# Patient Record
Sex: Female | Born: 1945 | Race: White | Hispanic: No | Marital: Married | Smoking: Never smoker
Health system: Southern US, Community
[De-identification: ages and names within clinical notes are randomized; demographics above are authoritative.]

## PROBLEM LIST (undated history)

## (undated) DIAGNOSIS — M810 Age-related osteoporosis without current pathological fracture: Secondary | ICD-10-CM

## (undated) DIAGNOSIS — T7840XA Allergy, unspecified, initial encounter: Secondary | ICD-10-CM

## (undated) DIAGNOSIS — H269 Unspecified cataract: Secondary | ICD-10-CM

## (undated) DIAGNOSIS — Z9289 Personal history of other medical treatment: Secondary | ICD-10-CM

## (undated) DIAGNOSIS — I4819 Other persistent atrial fibrillation: Secondary | ICD-10-CM

## (undated) DIAGNOSIS — I499 Cardiac arrhythmia, unspecified: Secondary | ICD-10-CM

## (undated) DIAGNOSIS — N819 Female genital prolapse, unspecified: Secondary | ICD-10-CM

## (undated) DIAGNOSIS — M1990 Unspecified osteoarthritis, unspecified site: Secondary | ICD-10-CM

## (undated) DIAGNOSIS — E785 Hyperlipidemia, unspecified: Secondary | ICD-10-CM

## (undated) DIAGNOSIS — J189 Pneumonia, unspecified organism: Secondary | ICD-10-CM

## (undated) DIAGNOSIS — J0100 Acute maxillary sinusitis, unspecified: Secondary | ICD-10-CM

## (undated) DIAGNOSIS — I10 Essential (primary) hypertension: Secondary | ICD-10-CM

## (undated) HISTORY — PX: TONSILLECTOMY: SUR1361

## (undated) HISTORY — DX: Unspecified osteoarthritis, unspecified site: M19.90

## (undated) HISTORY — PX: ABDOMINAL HYSTERECTOMY: SHX81

## (undated) HISTORY — DX: Acute maxillary sinusitis, unspecified: J01.00

## (undated) HISTORY — PX: KNEE SURGERY: SHX244

## (undated) HISTORY — DX: Personal history of other medical treatment: Z92.89

## (undated) HISTORY — DX: Other persistent atrial fibrillation: I48.19

## (undated) HISTORY — DX: Female genital prolapse, unspecified: N81.9

## (undated) HISTORY — PX: DILATION AND CURETTAGE OF UTERUS: SHX78

## (undated) HISTORY — DX: Allergy, unspecified, initial encounter: T78.40XA

## (undated) HISTORY — DX: Unspecified cataract: H26.9

## (undated) HISTORY — DX: Age-related osteoporosis without current pathological fracture: M81.0

## (undated) HISTORY — PX: DENTAL SURGERY: SHX609

## (undated) HISTORY — DX: Essential (primary) hypertension: I10

## (undated) HISTORY — DX: Hyperlipidemia, unspecified: E78.5

## (undated) HISTORY — DX: Cardiac arrhythmia, unspecified: I49.9

## (undated) HISTORY — PX: CHOLECYSTECTOMY: SHX55

---

## 2013-01-31 ENCOUNTER — Ambulatory Visit: Payer: Self-pay | Admitting: Gastroenterology

## 2013-04-21 LAB — HM PAP SMEAR: HM PAP: NEGATIVE

## 2013-05-24 DIAGNOSIS — IMO0002 Reserved for concepts with insufficient information to code with codable children: Secondary | ICD-10-CM | POA: Diagnosis not present

## 2013-05-24 DIAGNOSIS — M1710 Unilateral primary osteoarthritis, unspecified knee: Secondary | ICD-10-CM | POA: Diagnosis not present

## 2013-05-31 DIAGNOSIS — M1710 Unilateral primary osteoarthritis, unspecified knee: Secondary | ICD-10-CM | POA: Diagnosis not present

## 2013-05-31 DIAGNOSIS — IMO0002 Reserved for concepts with insufficient information to code with codable children: Secondary | ICD-10-CM | POA: Diagnosis not present

## 2013-06-03 DIAGNOSIS — J019 Acute sinusitis, unspecified: Secondary | ICD-10-CM | POA: Diagnosis not present

## 2013-07-25 DIAGNOSIS — J301 Allergic rhinitis due to pollen: Secondary | ICD-10-CM | POA: Diagnosis not present

## 2013-07-25 DIAGNOSIS — H93299 Other abnormal auditory perceptions, unspecified ear: Secondary | ICD-10-CM | POA: Diagnosis not present

## 2013-07-25 DIAGNOSIS — H6120 Impacted cerumen, unspecified ear: Secondary | ICD-10-CM | POA: Diagnosis not present

## 2014-02-21 DIAGNOSIS — Z23 Encounter for immunization: Secondary | ICD-10-CM | POA: Diagnosis not present

## 2014-05-16 DIAGNOSIS — J209 Acute bronchitis, unspecified: Secondary | ICD-10-CM | POA: Diagnosis not present

## 2014-05-16 DIAGNOSIS — J019 Acute sinusitis, unspecified: Secondary | ICD-10-CM | POA: Diagnosis not present

## 2014-05-22 DIAGNOSIS — E785 Hyperlipidemia, unspecified: Secondary | ICD-10-CM | POA: Diagnosis not present

## 2014-05-22 DIAGNOSIS — I10 Essential (primary) hypertension: Secondary | ICD-10-CM | POA: Diagnosis not present

## 2014-05-22 DIAGNOSIS — J329 Chronic sinusitis, unspecified: Secondary | ICD-10-CM | POA: Diagnosis not present

## 2014-07-26 DIAGNOSIS — E669 Obesity, unspecified: Secondary | ICD-10-CM | POA: Diagnosis not present

## 2014-07-26 DIAGNOSIS — Z01419 Encounter for gynecological examination (general) (routine) without abnormal findings: Secondary | ICD-10-CM | POA: Diagnosis not present

## 2014-10-05 ENCOUNTER — Other Ambulatory Visit: Payer: Self-pay | Admitting: Obstetrics and Gynecology

## 2014-10-05 DIAGNOSIS — Z1231 Encounter for screening mammogram for malignant neoplasm of breast: Secondary | ICD-10-CM

## 2014-10-05 DIAGNOSIS — M810 Age-related osteoporosis without current pathological fracture: Secondary | ICD-10-CM

## 2014-10-12 ENCOUNTER — Ambulatory Visit
Admission: RE | Admit: 2014-10-12 | Discharge: 2014-10-12 | Disposition: A | Discharge status: Home / Self Care | Payer: Medicare Other | Source: Ambulatory Visit | Attending: Obstetrics and Gynecology | Admitting: Obstetrics and Gynecology

## 2014-10-12 ENCOUNTER — Other Ambulatory Visit: Payer: Self-pay | Admitting: Obstetrics and Gynecology

## 2014-10-12 DIAGNOSIS — Z1231 Encounter for screening mammogram for malignant neoplasm of breast: Secondary | ICD-10-CM | POA: Diagnosis not present

## 2014-10-12 DIAGNOSIS — M810 Age-related osteoporosis without current pathological fracture: Secondary | ICD-10-CM | POA: Insufficient documentation

## 2014-10-24 ENCOUNTER — Telehealth: Payer: Self-pay | Admitting: Obstetrics and Gynecology

## 2014-10-24 NOTE — Telephone Encounter (Signed)
PLS ADVISE

## 2014-10-25 ENCOUNTER — Other Ambulatory Visit: Payer: Self-pay | Admitting: Obstetrics and Gynecology

## 2014-10-25 MED ORDER — ESTROGENS CONJ SYNTHETIC A 0.3 MG PO TABS: 0.3000 mg | ORAL_TABLET | Freq: Every day | ORAL | Status: DC

## 2014-10-25 NOTE — Telephone Encounter (Signed)
Returned call, answered questions about biphophinates - pt has dental implants and advised no to take them in the future, desires restart of cenestin to help with bone loss. rx sent in

## 2014-11-21 ENCOUNTER — Telehealth: Payer: Self-pay | Admitting: Obstetrics and Gynecology

## 2014-11-21 NOTE — Telephone Encounter (Signed)
Wonderful, will let pt know

## 2014-11-21 NOTE — Telephone Encounter (Signed)
Rep from insurance called and stated that the primerin was approved

## 2014-12-06 ENCOUNTER — Ambulatory Visit (INDEPENDENT_AMBULATORY_CARE_PROVIDER_SITE_OTHER): Payer: Medicare Other | Admitting: Family Medicine

## 2014-12-06 ENCOUNTER — Encounter: Payer: Self-pay | Admitting: Family Medicine

## 2014-12-06 VITALS — BP 118/70 | HR 89 | Temp 98.5°F | Resp 17 | Ht 60.0 in | Wt 154.6 lb

## 2014-12-06 DIAGNOSIS — I10 Essential (primary) hypertension: Secondary | ICD-10-CM

## 2014-12-06 DIAGNOSIS — E785 Hyperlipidemia, unspecified: Secondary | ICD-10-CM

## 2014-12-06 NOTE — Progress Notes (Signed)
Name: Zoe SievertJudy J Alvarado   MRN: [id number]    DOB: [birth year]   Date:12/06/2014       Progress Note  Subjective  Chief Complaint  Chief Complaint  Patient presents with  . Establish Care    NP (Dr. Elsie StainMoffeit)  . Hypertension  . Hyperlipidemia  . Advice Only    Questions concerning heart screening at a health fair she attended in October 27, 2014    Hypertension This is a chronic problem. The problem is controlled. Pertinent negatives include no chest pain, headaches, palpitations or shortness of breath. Past treatments include angiotensin blockers. There is no history of angina, kidney disease, CAD/MI or CVA.  Hyperlipidemia This is a chronic problem. The problem is controlled. Recent lipid tests were reviewed and are normal. Pertinent negatives include no chest pain, leg pain, myalgias or shortness of breath. She is currently on no antihyperlipidemic treatment.     Past Medical History  Diagnosis Date  . Hyperlipidemia   . Hypertension   . Allergy     History reviewed. No pertinent past surgical history.  Family History  Problem Relation Age of Onset  . Breast cancer Maternal Aunt 40  . Breast cancer Maternal Grandmother     History   Social History  . Marital Status: Married    Spouse Name: N/A  . Number of Children: N/A  . Years of Education: N/A   Occupational History  . Not on file.   Social History Main Topics  . Smoking status: Never Smoker   . Smokeless tobacco: Never Used  . Alcohol Use: No  . Drug Use: No  . Sexual Activity: Not on file   Other Topics Concern  . Not on file   Social History Narrative     Current outpatient prescriptions:  .  Calcium Carbonate-Vit D-Min (CALTRATE 600+D PLUS MINERALS) 600-800 MG-UNIT CHEW, Chew by mouth., Disp: , Rfl:  .  Cholecalciferol (VITAMIN D) 2000 UNITS CAPS, Take 1 capsule by mouth daily., Disp: , Rfl:  .  Estradiol-Estriol-Progesterone (BIEST/PROGESTERONE TD), , Disp: , Rfl:  .  Estradiol-Estriol-Progesterone  (BIEST/PROGESTERONE) CREA, , Disp: , Rfl:  .  estrogens conjugated, synthetic A, (CENESTIN) 0.3 MG tablet, Take 1 tablet (0.3 mg total) by mouth daily., Disp: 30 tablet, Rfl: 6 .  MULTIPLE VITAMINS-MINERALS PO, Take 1 tablet by mouth daily., Disp: , Rfl:  .  PREMARIN 0.3 MG tablet, Take 0.3 mg by mouth daily., Disp: , Rfl: 6 .  valsartan (DIOVAN) 160 MG tablet, Take 160 mg by mouth daily., Disp: , Rfl: 1  Allergies  Allergen Reactions  . Codeine   . Penicillins      Review of Systems  Respiratory: Negative for shortness of breath.   Cardiovascular: Negative for chest pain and palpitations.  Musculoskeletal: Positive for joint pain. Negative for myalgias.  Neurological: Negative for headaches.     Objective  Filed Vitals:   12/06/14 1553  BP: 118/70  Pulse: 89  Temp: 98.5 F (36.9 C)  TempSrc: Oral  Resp: 17  Height: 5' (1.524 m)  Weight: 154 lb 9.6 oz (70.126 kg)  SpO2: 96%    Physical Exam  Constitutional: She is oriented to person, place, and time and well-developed, well-nourished, and in no distress.  HENT:  Head: Normocephalic and atraumatic.  Eyes: Pupils are equal, round, and reactive to light.  Cardiovascular: Normal rate and regular rhythm.   Pulmonary/Chest: Effort normal and breath sounds normal.  Abdominal: Soft. Bowel sounds are normal.  Neurological: She is alert  and oriented to person, place, and time.  Nursing note and vitals reviewed.   Assessment & Plan 1. Essential hypertension BP is stable and controlled on present therapy.  2. Hyperlipidemia Pt. Had recent lab work done by an outside company which is normal. Recheck in 6 months.  Djeneba Barsch Asad A[facility] 12/06/2014 4:27 PM

## 2015-03-13 ENCOUNTER — Encounter: Payer: Self-pay | Admitting: Family Medicine

## 2015-03-13 ENCOUNTER — Ambulatory Visit (INDEPENDENT_AMBULATORY_CARE_PROVIDER_SITE_OTHER): Payer: Medicare Other | Admitting: Family Medicine

## 2015-03-13 VITALS — BP 114/68 | HR 87 | Temp 98.3°F | Resp 18 | Ht 59.0 in | Wt 156.6 lb

## 2015-03-13 DIAGNOSIS — J0110 Acute frontal sinusitis, unspecified: Secondary | ICD-10-CM | POA: Diagnosis not present

## 2015-03-13 MED ORDER — AZITHROMYCIN 250 MG PO TABS: ORAL_TABLET | ORAL | Status: DC

## 2015-03-13 NOTE — Progress Notes (Signed)
Name: Zoe Alvarado   MRN: [id number]    DOB: Nov 29, 1945   Date:03/13/2015       Progress Note  Subjective  Chief Complaint  Chief Complaint  Patient presents with  . URI    Onset 03/10/15, runny nose, sinus pressure/pain, ear popping, headache and achy feeling. Getting worst patient has been taking Claritin and just switched to Allegra, helping a little better.    URI  This is a new problem. The current episode started in the past 7 days (3 days ago). There has been no fever. Associated symptoms include congestion, coughing, ear pain, rhinorrhea, sinus pain and a sore throat. She has tried antihistamine for the symptoms.    Past Medical History  Diagnosis Date  . Hyperlipidemia   . Hypertension   . Allergy     No past surgical history on file.  Family History  Problem Relation Age of Onset  . Breast cancer Maternal Aunt 40  . Breast cancer Maternal Grandmother     Social History   Social History  . Marital Status: Married    Spouse Name: N/A  . Number of Children: N/A  . Years of Education: N/A   Occupational History  . Not on file.   Social History Main Topics  . Smoking status: Never Smoker   . Smokeless tobacco: Never Used  . Alcohol Use: No  . Drug Use: No  . Sexual Activity:    Partners: Male   Other Topics Concern  . Not on file   Social History Narrative    Current outpatient prescriptions:  .  Calcium Carbonate-Vit D-Min (CALTRATE 600+D PLUS MINERALS) 600-800 MG-UNIT CHEW, Chew by mouth., Disp: , Rfl:  .  Cholecalciferol (VITAMIN D) 2000 UNITS CAPS, Take 1 capsule by mouth daily., Disp: , Rfl:  .  Estradiol-Estriol-Progesterone (BIEST/PROGESTERONE TD), , Disp: , Rfl:  .  Estradiol-Estriol-Progesterone (BIEST/PROGESTERONE) CREA, , Disp: , Rfl:  .  estrogens conjugated, synthetic A, (CENESTIN) 0.3 MG tablet, Take 1 tablet (0.3 mg total) by mouth daily., Disp: 30 tablet, Rfl: 6 .  MULTIPLE VITAMINS-MINERALS PO, Take 1 tablet by mouth daily., Disp: ,  Rfl:  .  PREMARIN 0.3 MG tablet, Take 0.3 mg by mouth daily., Disp: , Rfl: 6 .  valsartan (DIOVAN) 160 MG tablet, Take 160 mg by mouth daily., Disp: , Rfl: 1  Allergies  Allergen Reactions  . Codeine   . Penicillins    Review of Systems  Constitutional: Negative for fever and chills.  HENT: Positive for congestion, ear pain, rhinorrhea and sore throat.   Eyes: Negative for pain.  Respiratory: Positive for cough.     Objective  Filed Vitals:   03/13/15 1054  BP: 114/68  Pulse: 87  Temp: 98.3 F (36.8 C)  TempSrc: Oral  Resp: 18  Height: 4\' 11"  (1.499 m)  Weight: 156 lb 9.6 oz (71.033 kg)  SpO2: 96%    Physical Exam  Constitutional: She is well-developed, well-nourished, and in no distress.  HENT:  Head: Normocephalic and atraumatic.  Right Ear: Ear canal normal.  Left Ear: Ear canal normal.  Nose: Right sinus exhibits maxillary sinus tenderness and frontal sinus tenderness. Left sinus exhibits maxillary sinus tenderness and frontal sinus tenderness.  Mouth/Throat: No posterior oropharyngeal erythema.  Cerumen impaction.  Cardiovascular: Normal rate, regular rhythm and normal heart sounds.   No murmur heard. Pulmonary/Chest: Effort normal and breath sounds normal. She has no wheezes.  Nursing note and vitals reviewed.   Assessment & Plan  1.  Acute non-recurrent frontal sinusitis Symptoms improving on antihistamine therapy. Encouraged conservative management and provided prescription for Z-Pak in case her symptoms do not resolve within the next 48 hours. - azithromycin (ZITHROMAX Z-PAK) 250 MG tablet; 2 tabs po x day 1, then 1 tab po q day x 4 days  Dispense: 6 each; Refill: 0   Lyndell Allaire Asad [facility] 03/13/2015 11:09 AM

## 2015-04-17 DIAGNOSIS — Z23 Encounter for immunization: Secondary | ICD-10-CM | POA: Diagnosis not present

## 2015-04-26 ENCOUNTER — Encounter: Payer: Self-pay | Admitting: Family Medicine

## 2015-05-02 ENCOUNTER — Encounter: Payer: Self-pay | Admitting: Family Medicine

## 2015-05-02 ENCOUNTER — Ambulatory Visit (INDEPENDENT_AMBULATORY_CARE_PROVIDER_SITE_OTHER): Payer: Medicare Other | Admitting: Family Medicine

## 2015-05-02 VITALS — BP 114/70 | HR 68 | Temp 98.2°F | Resp 15 | Ht 59.0 in | Wt 154.8 lb

## 2015-05-02 DIAGNOSIS — E66811 Obesity, class 1: Secondary | ICD-10-CM

## 2015-05-02 DIAGNOSIS — I10 Essential (primary) hypertension: Secondary | ICD-10-CM

## 2015-05-02 DIAGNOSIS — E669 Obesity, unspecified: Secondary | ICD-10-CM | POA: Diagnosis not present

## 2015-05-02 NOTE — Progress Notes (Signed)
Name: Zoe Alvarado   MRN: [id number]    DOB: 09-Apr-1946   Date:05/02/2015       Progress Note  Subjective  Chief Complaint  Chief Complaint  Patient presents with  . Follow-up    Discuss Weight  . Hyperlipidemia  . Hypertension    Hypertension This is a chronic problem. The problem is unchanged. The problem is controlled. Pertinent negatives include no blurred vision, chest pain, headaches, palpitations or shortness of breath. Past treatments include angiotensin blockers. There is no history of kidney disease, CAD/MI or CVA.    Pt. Is here for weight management counseling and statement for her weight loss program. She weighs 154 lbs, BMI of 31.27. She has joined [employer] which stands for Take Off Pounds Sensibly. She is requesting a physician statement with recommendation on her goal weight.    Past Medical History  Diagnosis Date  . Hyperlipidemia   . Hypertension   . Allergy     History reviewed. No pertinent past surgical history.  Family History  Problem Relation Age of Onset  . Breast cancer Maternal Aunt 40  . Breast cancer Maternal Grandmother     Social History   Social History  . Marital Status: Married    Spouse Name: N/A  . Number of Children: N/A  . Years of Education: N/A   Occupational History  . Not on file.   Social History Main Topics  . Smoking status: Never Smoker   . Smokeless tobacco: Never Used  . Alcohol Use: No  . Drug Use: No  . Sexual Activity:    Partners: Male   Other Topics Concern  . Not on file   Social History Narrative     Current outpatient prescriptions:  .  Calcium Carbonate-Vit D-Min (CALTRATE 600+D PLUS MINERALS) 600-800 MG-UNIT CHEW, Chew by mouth., Disp: , Rfl:  .  Cholecalciferol (VITAMIN D) 2000 UNITS CAPS, Take 1 capsule by mouth daily., Disp: , Rfl:  .  Estradiol-Estriol-Progesterone (BIEST/PROGESTERONE TD), , Disp: , Rfl:  .  Estradiol-Estriol-Progesterone (BIEST/PROGESTERONE) CREA, , Disp: , Rfl:  .   estrogens conjugated, synthetic A, (CENESTIN) 0.3 MG tablet, Take 1 tablet (0.3 mg total) by mouth daily., Disp: 30 tablet, Rfl: 6 .  MULTIPLE VITAMINS-MINERALS PO, Take 1 tablet by mouth daily., Disp: , Rfl:  .  PREMARIN 0.3 MG tablet, Take 0.3 mg by mouth daily., Disp: , Rfl: 6 .  valsartan (DIOVAN) 160 MG tablet, Take 160 mg by mouth daily., Disp: , Rfl: 1  Allergies  Allergen Reactions  . Codeine   . Penicillins     Review of Systems  Constitutional: Negative for fever and chills.  Eyes: Negative for blurred vision and double vision.  Respiratory: Negative for shortness of breath.   Cardiovascular: Negative for chest pain and palpitations.  Neurological: Negative for headaches.    Objective  Filed Vitals:   05/02/15 1149  BP: 114/70  Pulse: 68  Temp: 98.2 F (36.8 C)  TempSrc: Oral  Resp: 15  Height:  (1.499 m)  Weight: 154 lb 12.8 oz (70.217 kg)  SpO2: 98%    Physical Exam  Constitutional: She is oriented to person, place, and time and well-developed, well-nourished, and in no distress.  HENT:  Head: Normocephalic and atraumatic.  Cardiovascular: Normal rate, regular rhythm and normal heart sounds.   No murmur heard. Pulmonary/Chest: Effort normal and breath sounds normal. She has no wheezes.  Abdominal: Soft. Bowel sounds are normal. There is no tenderness.  Neurological: She  is alert and oriented to person, place, and time.  Skin: Skin is warm and dry.  Psychiatric: Mood, memory, affect and judgment normal.  Nursing note and vitals reviewed.     Assessment & Plan  1. Obesity (BMI 30.0-34.9) Recommended that she should try to lose around 15 pounds in the next 3 months. Statement to that effect provided.  2. Essential hypertension BP stable and controlled on present therapy.   Marlie Kuennen Asad [facility] 05/02/2015 12:03 PM

## 2015-05-12 ENCOUNTER — Other Ambulatory Visit: Payer: Self-pay | Admitting: Family Medicine

## 2015-05-30 DIAGNOSIS — H2513 Age-related nuclear cataract, bilateral: Secondary | ICD-10-CM | POA: Diagnosis not present

## 2015-06-24 ENCOUNTER — Other Ambulatory Visit: Payer: Self-pay | Admitting: Obstetrics and Gynecology

## 2015-07-31 ENCOUNTER — Encounter: Payer: Self-pay | Admitting: Family Medicine

## 2015-07-31 ENCOUNTER — Ambulatory Visit (INDEPENDENT_AMBULATORY_CARE_PROVIDER_SITE_OTHER): Payer: Medicare Other | Admitting: Family Medicine

## 2015-07-31 VITALS — BP 100/68 | HR 76 | Temp 99.0°F | Resp 14 | Ht 59.0 in | Wt 149.2 lb

## 2015-07-31 DIAGNOSIS — Z23 Encounter for immunization: Secondary | ICD-10-CM | POA: Diagnosis not present

## 2015-07-31 DIAGNOSIS — I10 Essential (primary) hypertension: Secondary | ICD-10-CM

## 2015-07-31 DIAGNOSIS — E785 Hyperlipidemia, unspecified: Secondary | ICD-10-CM | POA: Diagnosis not present

## 2015-07-31 DIAGNOSIS — E669 Obesity, unspecified: Secondary | ICD-10-CM | POA: Diagnosis not present

## 2015-07-31 NOTE — Progress Notes (Signed)
Name: Zoe Alvarado   MRN: [id number]    DOB: May 19, 1946   Date:07/31/2015       Progress Note  Subjective  Chief Complaint  Chief Complaint  Patient presents with  . Follow-up    3 mo  . Hyperlipidemia  . Hypertension    HPI  Hypertension:  Blood Pressure well-controlled, mainly 110-120/60-80, she takes Valsartan 80 mg daily, no side effects. Hyperlipidemia: Controlled on dietary therapy, takes no meds. Last Lipid Panel in January 2016 was at goal. Will repeat today. Obesity: Weight log reviewed, she has dropped from 157 lbs to 149 lbs over the last three months. She is participating in TOPS (Take Off Pounds Sensibly), which is group support program.  Past Medical History  Diagnosis Date  . Hyperlipidemia   . Hypertension   . Allergy     History reviewed. No pertinent past surgical history.  Family History  Problem Relation Age of Onset  . Breast cancer Maternal Aunt 40  . Breast cancer Maternal Grandmother     Social History   Social History  . Marital Status: Married    Spouse Name: N/A  . Number of Children: N/A  . Years of Education: N/A   Occupational History  . Not on file.   Social History Main Topics  . Smoking status: Never Smoker   . Smokeless tobacco: Never Used  . Alcohol Use: No  . Drug Use: No  . Sexual Activity:    Partners: Male   Other Topics Concern  . Not on file   Social History Narrative     Current outpatient prescriptions:  .  Calcium Carbonate-Vit D-Min (CALTRATE 600+D PLUS MINERALS) 600-800 MG-UNIT CHEW, Chew by mouth., Disp: , Rfl:  .  Cholecalciferol (VITAMIN D) 2000 UNITS CAPS, Take 1 capsule by mouth daily., Disp: , Rfl:  .  Estradiol-Estriol-Progesterone (BIEST/PROGESTERONE TD), , Disp: , Rfl:  .  Estradiol-Estriol-Progesterone (BIEST/PROGESTERONE) CREA, , Disp: , Rfl:  .  estrogens conjugated, synthetic A, (CENESTIN) 0.3 MG tablet, Take 1 tablet (0.3 mg total) by mouth daily., Disp: 30 tablet, Rfl: 6 .  MULTIPLE  VITAMINS-MINERALS PO, Take 1 tablet by mouth daily., Disp: , Rfl:  .  PREMARIN 0.3 MG tablet, TAKE ONE TABLET BY MOUTH ONCE DAILY, Disp: 30 tablet, Rfl: 0 .  valsartan (DIOVAN) 160 MG tablet, TAKE 1 TABLET BY MOUTH EVERY DAY, Disp: 90 tablet, Rfl: 1  Allergies  Allergen Reactions  . Codeine   . Penicillins      Review of Systems  Constitutional: Negative for fever and chills.  Eyes: Negative for blurred vision and double vision.  Cardiovascular: Negative for chest pain and palpitations.  Neurological: Negative for headaches.    Objective  Filed Vitals:   07/31/15 0936  BP: 100/68  Pulse: 76  Temp: 99 F (37.2 C)  TempSrc: Oral  Resp: 14  Height: 4\' 11"  (1.499 m)  Weight: 149 lb 3.2 oz (67.677 kg)  SpO2: 97%    Physical Exam  Constitutional: She is oriented to person, place, and time and well-developed, well-nourished, and in no distress.  HENT:  Head: Normocephalic and atraumatic.  Cardiovascular: Normal rate and regular rhythm.   Pulmonary/Chest: Effort normal and breath sounds normal.  Abdominal: Soft. Bowel sounds are normal.  Musculoskeletal:       Right ankle: She exhibits no swelling.       Left ankle: She exhibits no swelling.  Neurological: She is alert and oriented to person, place, and time.  Nursing note and  vitals reviewed.     Assessment & Plan  1. Essential hypertension  BP well controlled. Continue present therapy  2. Dyslipidemia  - Lipid Profile - Comprehensive Metabolic Panel (CMET)  3. Obesity (BMI 30.0-34.9)  Lost 8 pounds in last 3 months. Congratulated and encouraged to continue with the program.     Hazel Sams [facility] 07/31/2015 9:49 AM

## 2015-08-01 ENCOUNTER — Other Ambulatory Visit: Payer: Self-pay | Admitting: Radiology

## 2015-08-01 ENCOUNTER — Ambulatory Visit (INDEPENDENT_AMBULATORY_CARE_PROVIDER_SITE_OTHER): Payer: Medicare Other | Admitting: Obstetrics and Gynecology

## 2015-08-01 ENCOUNTER — Encounter: Payer: Self-pay | Admitting: Obstetrics and Gynecology

## 2015-08-01 VITALS — BP 125/75 | HR 73 | Ht 59.0 in | Wt 148.4 lb

## 2015-08-01 DIAGNOSIS — Z0000 Encounter for general adult medical examination without abnormal findings: Secondary | ICD-10-CM

## 2015-08-01 DIAGNOSIS — Z124 Encounter for screening for malignant neoplasm of cervix: Secondary | ICD-10-CM

## 2015-08-01 DIAGNOSIS — Z1231 Encounter for screening mammogram for malignant neoplasm of breast: Secondary | ICD-10-CM

## 2015-08-01 DIAGNOSIS — Z01419 Encounter for gynecological examination (general) (routine) without abnormal findings: Secondary | ICD-10-CM

## 2015-08-01 MED ORDER — ESTRADIOL 0.5 MG PO TABS: 0.5000 mg | ORAL_TABLET | Freq: Every day | ORAL | Status: DC

## 2015-08-01 MED ORDER — PROGESTERONE MICRONIZED 100 MG PO CAPS: 100.0000 mg | ORAL_CAPSULE | Freq: Every day | ORAL | Status: DC

## 2015-08-01 NOTE — Patient Instructions (Signed)
Place annual gynecologic exam patient instructions here.

## 2015-08-01 NOTE — Progress Notes (Signed)
Subjective:   Zoe Alvarado is a 70 y.o. [id number] Caucasian female here for a routine well-woman exam.  No LMP recorded. Patient has had a hysterectomy.    Current complaints: none PCP: Sherryll Burger       Does need labs  Social History: Sexual: heterosexual Marital Status: married Living situation: with spouse Occupation: retired Tobacco/alcohol: no tobacco use Illicit drugs: no history of illicit drug use  The following portions of the patient's history were reviewed and updated as appropriate: allergies, current medications, past family history, past medical history, past social history, past surgical history and problem list.  Past Medical History Past Medical History  Diagnosis Date  . Hyperlipidemia   . Hypertension   . Allergy     Past Surgical History History reviewed. No pertinent past surgical history.  Gynecologic History [id number]  No LMP recorded. Patient has had a hysterectomy. Contraception: post menopausal status Last Pap: 2014. Results were: normal Last mammogram: 2016. Results were: normal Last BDS 2016- osteoporosis  Obstetric History OB History  Gravida Para Term Preterm AB SAB TAB Ectopic Multiple Living  # Outcome Date GA Lbr Len/2nd Weight Sex Delivery Anes PTL Lv  4 Gravida 1981    M Vag-Spont   Y  3 Gravida 1978    M Vag-Spont   Y  2 Para 1976    M Vag-Spont   Y  1 TAB               Current Medications Current Outpatient Prescriptions on File Prior to Visit  Medication Sig Dispense Refill  . Calcium Carbonate-Vit D-Min (CALTRATE 600+D PLUS MINERALS) 600-800 MG-UNIT CHEW Chew by mouth.    . Cholecalciferol (VITAMIN D) 2000 UNITS CAPS Take 1 capsule by mouth daily.    . Estradiol-Estriol-Progesterone (BIEST/PROGESTERONE TD)     . Estradiol-Estriol-Progesterone (BIEST/PROGESTERONE) CREA     . MULTIPLE VITAMINS-MINERALS PO Take 1 tablet by mouth daily.    Marland Kitchen PREMARIN 0.3 MG tablet TAKE ONE TABLET BY MOUTH ONCE DAILY 30 tablet 0  .  valsartan (DIOVAN) 160 MG tablet TAKE 1 TABLET BY MOUTH EVERY DAY 90 tablet 1  . estrogens conjugated, synthetic A, (CENESTIN) 0.3 MG tablet Take 1 tablet (0.3 mg total) by mouth daily. (Patient not taking: Reported on 08/01/2015) 30 tablet 6   No current facility-administered medications on file prior to visit.    Review of Systems Patient denies any headaches, blurred vision, shortness of breath, chest pain, abdominal pain, problems with bowel movements, urination, or intercourse.  Objective:  BP 125/75 mmHg  Pulse 73  Ht  (1.499 m)  Wt 148 lb 6.4 oz (67.314 kg)  BMI 29.96 kg/m2 Physical Exam  General:  Well developed, well nourished, no acute distress. She is alert and oriented x3. Skin:  Warm and dry Neck:  Midline trachea, no thyromegaly or nodules Cardiovascular: Regular rate and rhythm, no murmur heard Lungs:  Effort normal, all lung fields clear to auscultation bilaterally Breasts:  No dominant palpable mass, retraction, or nipple discharge Abdomen:  Soft, non tender, no hepatosplenomegaly or masses Pelvic:  External genitalia is normal in appearance.  The vagina is normal atrophic in appearance. The cervix is bulbous, no CMT.  Thin prep pap is not done . Uterus is felt to be normal size, shape, and contour.  No adnexal masses or tenderness noted. Extremities:  No swelling or varicosities noted Psych:  She has a normal mood and  affect  Assessment:   Healthy well-woman exam Overweight Osteoporosis HRT   Plan:  labd will be obtained with in next week F/U 1 year for AE, or sooner if needed Mammogram scheduled Continue current  Calcium supplement (cannot take fosomax type meds due to dental/jaw surgery). Will stop premarin and HRT creams, and switch to estadiol 5mg  daily with prometrium 100mg  nightly.  Nickol Collister Suzan NailerN Shamaya Kauer, CNM

## 2015-08-03 ENCOUNTER — Other Ambulatory Visit: Payer: Self-pay | Admitting: Obstetrics and Gynecology

## 2015-08-03 DIAGNOSIS — E785 Hyperlipidemia, unspecified: Secondary | ICD-10-CM | POA: Diagnosis not present

## 2015-08-03 DIAGNOSIS — E559 Vitamin D deficiency, unspecified: Secondary | ICD-10-CM | POA: Diagnosis not present

## 2015-08-04 LAB — COMPREHENSIVE METABOLIC PANEL
A/G RATIO: 1.8 (ref 1.2–2.2)
ALT: 10 IU/L (ref 0–32)
AST: 14 IU/L (ref 0–40)
Albumin: 4.2 g/dL (ref 3.6–4.8)
Alkaline Phosphatase: 106 IU/L (ref 39–117)
BUN/Creatinine Ratio: 21 (ref 11–26)
BUN: 10 mg/dL (ref 8–27)
Bilirubin Total: 0.5 mg/dL (ref 0.0–1.2)
CALCIUM: 8.9 mg/dL (ref 8.7–10.3)
CO2: 24 mmol/L (ref 18–29)
CREATININE: 0.48 mg/dL — AB (ref 0.57–1.00)
Chloride: 103 mmol/L (ref 96–106)
GFR, EST AFRICAN AMERICAN: 117 mL/min/{1.73_m2} (ref >59)
GFR, EST NON AFRICAN AMERICAN: 101 mL/min/{1.73_m2} (ref >59)
GLUCOSE: 87 mg/dL (ref 65–99)
Globulin, Total: 2.3 g/dL (ref 1.5–4.5)
Potassium: 4 mmol/L (ref 3.5–5.2)
Sodium: 141 mmol/L (ref 134–144)
TOTAL PROTEIN: 6.5 g/dL (ref 6.0–8.5)

## 2015-08-04 LAB — LIPID PANEL
CHOL/HDL RATIO: 2.8 ratio (ref 0.0–4.4)
Cholesterol, Total: 176 mg/dL (ref 100–199)
HDL: 63 mg/dL (ref >39)
LDL CALC: 92 mg/dL (ref 0–99)
TRIGLYCERIDES: 107 mg/dL (ref 0–149)
VLDL Cholesterol Cal: 21 mg/dL (ref 5–40)

## 2015-08-04 LAB — VITAMIN D 25 HYDROXY (VIT D DEFICIENCY, FRACTURES): Vit D, 25-Hydroxy: 40.8 ng/mL (ref 30.0–100.0)

## 2015-08-07 ENCOUNTER — Telehealth: Payer: Self-pay | Admitting: Obstetrics and Gynecology

## 2015-08-07 NOTE — Telephone Encounter (Signed)
PT CAME BY AND STATED THAT SHE WAS HERE A FEW WEEKS AGO AND SAW MNS AND SHE WAS PRESCRIBED 2 MEDICATIONS AND SHE HAS ONE THAT IS FILLED BUT THE OTHER ONE NEEDS MORE INFORMATION TO THE INSURANCE AND ITS THE PROGESTERONE, HER PHARMACY TOLD HER THAT THEY HAVE FAXED US THE PRIOR AUTH TWICE SO SHE WAS CHECKING WITH US TO SEE IF WE HAVE DONE OUR PART, PT WOULD LIKE A CALL BACK FOR AN UPDATE.

## 2015-08-22 NOTE — Telephone Encounter (Signed)
Spoke with pt states [employer] is going to fax form for exception for her medication

## 2015-08-24 ENCOUNTER — Other Ambulatory Visit: Payer: Self-pay | Admitting: Obstetrics and Gynecology

## 2015-09-13 DIAGNOSIS — B351 Tinea unguium: Secondary | ICD-10-CM | POA: Diagnosis not present

## 2015-09-13 DIAGNOSIS — M79675 Pain in left toe(s): Secondary | ICD-10-CM | POA: Diagnosis not present

## 2015-09-18 ENCOUNTER — Ambulatory Visit: Payer: Medicare Other | Admitting: Podiatry

## 2015-10-30 ENCOUNTER — Ambulatory Visit: Payer: Medicare Other | Admitting: Family Medicine

## 2016-02-01 ENCOUNTER — Telehealth: Payer: Self-pay | Admitting: Obstetrics and Gynecology

## 2016-02-01 NOTE — Telephone Encounter (Signed)
PT CALLED AND SHE WAS SEEN ON 08/01/15, SHE WAS SEEN FOR HER AE, AND SHE IS STATING THAT INSURANCE IS DENYING THE VISIT. SHE HAS MEDICARE AND BLUE CROSS AND BLUE SHIELD SUPPLEMENT. MEDICARE TOLD HER IT WAS CODED ROUTINE AND THEY DON'T PAY ROUTINE. SHE TALKED TO SOMEONE IN OUR BILLING DEPARTMENT TO LET THEM KNOW SHE WAS CALLING US AND LOOKING INTO BEFORE SHE PAID THE BILL, AND SHE STATED THAT SOMEONE OUT THE ONCE BILLING SAID HE COULD FIX IT THEN HE SAID HE COULDN'T SO SHE JUST WANTED TO CALL US AND HAVE US LOOK INTO IT BECAUSE SHE THINKS THAT THE PERSON AT [facility] BILLING DOESN'T KNOW WHAT TO DO.  CAN YOU LOOK INTO THIS AND SEE IF WE CAN RE CODE AND RE SEND TO INSURANCE. SHE WOULD LIKE A CALL BACK ABOUT THIS IN WHATEVER WE FIND OUT AND OD AND SHE SAID IF SHE DOES NOT ANSWER YOU CAN LEAVE A MESSAGE.

## 2016-03-05 NOTE — Telephone Encounter (Signed)
Sent email to billing department 

## 2016-03-20 ENCOUNTER — Encounter: Payer: Self-pay | Admitting: Family Medicine

## 2016-03-20 ENCOUNTER — Other Ambulatory Visit: Payer: Self-pay | Admitting: Obstetrics and Gynecology

## 2016-03-20 ENCOUNTER — Ambulatory Visit (INDEPENDENT_AMBULATORY_CARE_PROVIDER_SITE_OTHER): Payer: Medicare Other | Admitting: Family Medicine

## 2016-03-20 DIAGNOSIS — J0100 Acute maxillary sinusitis, unspecified: Secondary | ICD-10-CM | POA: Diagnosis not present

## 2016-03-20 HISTORY — DX: Acute maxillary sinusitis, unspecified: J01.00

## 2016-03-20 MED ORDER — AZITHROMYCIN 250 MG PO TABS: ORAL_TABLET | ORAL | 0 refills | Status: DC

## 2016-03-20 NOTE — Progress Notes (Signed)
Name: Donell SievertJudy J Bignell   MRN: [id number]    DOB: [birth year]   Date:03/20/2016       Progress Note  Subjective  Chief Complaint  Chief Complaint  Patient presents with  . Sinusitis  . Cough    coughing up yellow congestion   . Nasal Congestion    Sinusitis  This is a new problem. The current episode started in the past 7 days. The problem is unchanged. There has been no fever. Associated symptoms include congestion, coughing, headaches (swelling around the eyes), sinus pressure and sneezing. Pertinent negatives include no chills or sore throat. Past treatments include oral decongestants (Allegra). The treatment provided moderate relief.    Past Medical History:  Diagnosis Date  . Allergy   . Hyperlipidemia   . Hypertension     No past surgical history on file.  Family History  Problem Relation Age of Onset  . Breast cancer Maternal Aunt 40  . Breast cancer Maternal Grandmother     Social History   Social History  . Marital status: Married    Spouse name: N/A  . Number of children: N/A  . Years of education: N/A   Occupational History  . Not on file.   Social History Main Topics  . Smoking status: Never Smoker  . Smokeless tobacco: Never Used  . Alcohol use No  . Drug use: No  . Sexual activity: Yes    Partners: Male   Other Topics Concern  . Not on file   Social History Narrative  . No narrative on file     Current Outpatient Prescriptions:  .  Calcium Carbonate-Vit D-Min (CALTRATE 600+D PLUS MINERALS) 600-800 MG-UNIT CHEW, Chew by mouth., Disp: , Rfl:  .  Cholecalciferol (VITAMIN D) 2000 UNITS CAPS, Take 1 capsule by mouth daily., Disp: , Rfl:  .  estradiol (ESTRACE) 0.5 MG tablet, Take 1 tablet (0.5 mg total) by mouth daily., Disp: 90 tablet, Rfl: 4 .  MULTIPLE VITAMINS-MINERALS PO, Take 1 tablet by mouth daily., Disp: , Rfl:  .  valsartan (DIOVAN) 160 MG tablet, TAKE 1 TABLET BY MOUTH EVERY DAY, Disp: 90 tablet, Rfl: 1 .  estrogens conjugated, synthetic  A, (CENESTIN) 0.3 MG tablet, Take 1 tablet (0.3 mg total) by mouth daily. (Patient not taking: Reported on 03/20/2016), Disp: 30 tablet, Rfl: 6 .  progesterone (PROMETRIUM) 100 MG capsule, Take 1 capsule (100 mg total) by mouth daily. (Patient not taking: Reported on 03/20/2016), Disp: 90 capsule, Rfl: 4  Allergies  Allergen Reactions  . Codeine   . Penicillins      Review of Systems  Constitutional: Negative for chills and fever.  HENT: Positive for congestion, sinus pressure and sneezing. Negative for sore throat.   Respiratory: Positive for cough.   Cardiovascular: Negative for chest pain.  Neurological: Positive for headaches (swelling around the eyes).    Objective  Vitals:   03/20/16 1052  BP: 106/64  Pulse: 73  Temp: 98.1 F (36.7 C)  SpO2: 97%  Weight: 144 lb 11.2 oz (65.6 kg)  Height: 4\' 11"  (1.499 m)    Physical Exam  Constitutional: She is well-developed, well-nourished, and in no distress.  HENT:  Nose: Right sinus exhibits maxillary sinus tenderness. Left sinus exhibits maxillary sinus tenderness.  Mouth/Throat: No posterior oropharyngeal erythema.  Cerumen impaction bilaterally. Nasal mucosal inflammation, turbinate hypertrophy.  Cardiovascular: Normal rate, regular rhythm, S1 normal, S2 normal and normal heart sounds.   No murmur heard. Pulmonary/Chest: Breath sounds normal. She has no wheezes.  She has no rhonchi.  Nursing note and vitals reviewed.   Assessment & Plan  1. Acute non-recurrent maxillary sinusitis By history and exam, we'll start on antibiotic therapy based on duration of symptoms - azithromycin (ZITHROMAX) 250 MG tablet; 2 tabs po day 1, then 1 tab po q day x 4 days  Dispense: 6 tablet; Refill: 0   Samani Deal Asad A. Faylene KurtzShah [facility] 03/20/2016 11:22 AM

## 2016-05-08 DIAGNOSIS — Z23 Encounter for immunization: Secondary | ICD-10-CM | POA: Diagnosis not present

## 2016-08-01 ENCOUNTER — Encounter: Payer: Medicare Other | Admitting: Obstetrics and Gynecology

## 2016-09-04 ENCOUNTER — Other Ambulatory Visit: Payer: Self-pay | Admitting: Obstetrics and Gynecology

## 2016-09-04 ENCOUNTER — Encounter: Payer: Medicare Other | Admitting: Obstetrics and Gynecology

## 2016-09-04 DIAGNOSIS — Z1231 Encounter for screening mammogram for malignant neoplasm of breast: Secondary | ICD-10-CM

## 2016-09-08 ENCOUNTER — Encounter: Payer: Medicare Other | Admitting: Obstetrics and Gynecology

## 2016-09-22 ENCOUNTER — Ambulatory Visit: Payer: Self-pay

## 2016-09-25 ENCOUNTER — Ambulatory Visit
Admission: RE | Admit: 2016-09-25 | Discharge: 2016-09-25 | Disposition: A | Discharge status: Home / Self Care | Payer: Medicare Other | Source: Ambulatory Visit | Attending: Obstetrics and Gynecology | Admitting: Obstetrics and Gynecology

## 2016-09-25 DIAGNOSIS — Z1231 Encounter for screening mammogram for malignant neoplasm of breast: Secondary | ICD-10-CM | POA: Diagnosis not present

## 2016-10-15 ENCOUNTER — Other Ambulatory Visit: Payer: Self-pay | Admitting: Obstetrics and Gynecology

## 2016-11-07 ENCOUNTER — Other Ambulatory Visit: Payer: Self-pay | Admitting: Family Medicine

## 2016-11-07 NOTE — Telephone Encounter (Signed)
Pt has scheduled.

## 2016-11-07 NOTE — Telephone Encounter (Signed)
Patient no showed for last appt No labs since March 2017 I'll approve limited amount Patient should plan to see Dr. Sherryll BurgerShah and consider labs very soon Thank you

## 2016-11-10 DIAGNOSIS — K063 Horizontal alveolar bone loss: Secondary | ICD-10-CM | POA: Diagnosis not present

## 2016-11-10 DIAGNOSIS — K033 Pathological resorption of teeth: Secondary | ICD-10-CM | POA: Diagnosis not present

## 2016-11-12 ENCOUNTER — Ambulatory Visit: Payer: Medicare Other | Admitting: Family Medicine

## 2016-12-10 ENCOUNTER — Encounter: Payer: Self-pay | Admitting: Family Medicine

## 2016-12-10 ENCOUNTER — Ambulatory Visit (INDEPENDENT_AMBULATORY_CARE_PROVIDER_SITE_OTHER): Payer: Medicare Other | Admitting: Family Medicine

## 2016-12-10 VITALS — BP 110/77 | HR 75 | Temp 97.9°F | Resp 16 | Ht 59.0 in | Wt 143.3 lb

## 2016-12-10 DIAGNOSIS — I10 Essential (primary) hypertension: Secondary | ICD-10-CM

## 2016-12-10 MED ORDER — VALSARTAN 160 MG PO TABS: 80.0000 mg | ORAL_TABLET | Freq: Every day | ORAL | 0 refills | Status: DC

## 2016-12-10 NOTE — Progress Notes (Signed)
Name: Zoe SievertJudy J Alvarado   MRN: [id number]    DOB: [birth year]   Date:12/10/2016       Progress Note  Subjective  Chief Complaint  Chief Complaint  Patient presents with  . Medication Refill    valsartan    Hypertension  This is a chronic problem. The problem is unchanged. The problem is controlled. Pertinent negatives include no blurred vision, chest pain, headaches, palpitations or shortness of breath. Past treatments include angiotensin blockers. There is no history of kidney disease, CAD/MI or CVA.      Past Medical History:  Diagnosis Date  . Allergy   . Hyperlipidemia   . Hypertension     History reviewed. No pertinent surgical history.  Family History  Problem Relation Age of Onset  . Breast cancer Maternal Aunt 40  . Breast cancer Maternal Grandmother     Social History   Social History  . Marital status: Married    Spouse name: N/A  . Number of children: N/A  . Years of education: N/A   Occupational History  . Not on file.   Social History Main Topics  . Smoking status: Never Smoker  . Smokeless tobacco: Never Used  . Alcohol use No  . Drug use: No  . Sexual activity: Yes    Partners: Male   Other Topics Concern  . Not on file   Social History Narrative  . No narrative on file     Current Outpatient Prescriptions:  .  Calcium Carbonate-Vit D-Min (CALTRATE 600+D PLUS MINERALS) 600-800 MG-UNIT CHEW, Chew by mouth., Disp: , Rfl:  .  Cholecalciferol (VITAMIN D) 2000 UNITS CAPS, Take 1 capsule by mouth daily., Disp: , Rfl:  .  estradiol (ESTRACE) 0.5 MG tablet, TAKE ONE TABLET BY MOUTH ONCE DAILY, Disp: 90 tablet, Rfl: 4 .  MULTIPLE VITAMINS-MINERALS PO, Take 1 tablet by mouth daily., Disp: , Rfl:  .  valsartan (DIOVAN) 160 MG tablet, TAKE ONE TABLET BY MOUTH ONCE DAILY, Disp: 7 tablet, Rfl: 0 .  estrogens conjugated, synthetic A, (CENESTIN) 0.3 MG tablet, Take 1 tablet (0.3 mg total) by mouth daily. (Patient not taking: Reported on 03/20/2016), Disp: 30  tablet, Rfl: 6 .  progesterone (PROMETRIUM) 100 MG capsule, Take 1 capsule (100 mg total) by mouth daily. (Patient not taking: Reported on 03/20/2016), Disp: 90 capsule, Rfl: 4  Allergies  Allergen Reactions  . Codeine   . Penicillins      Review of Systems  Eyes: Negative for blurred vision.  Respiratory: Negative for shortness of breath.   Cardiovascular: Negative for chest pain and palpitations.  Neurological: Negative for headaches.      Objective  Vitals:   12/10/16 0836  BP: 110/77  Pulse: 75  Resp: 16  Temp: 97.9 F (36.6 C)  TempSrc: Oral  SpO2: 96%  Weight: 143 lb 4.8 oz (65 kg)  Height: 4\' 11"  (1.499 m)    Physical Exam  Constitutional: She is oriented to person, place, and time and well-developed, well-nourished, and in no distress.  HENT:  Head: Normocephalic and atraumatic.  Cardiovascular: Normal rate, regular rhythm and normal heart sounds.   No murmur heard. Pulmonary/Chest: Effort normal and breath sounds normal. She has no wheezes.  Musculoskeletal: She exhibits no edema.  Neurological: She is alert and oriented to person, place, and time.  Psychiatric: Mood, memory, affect and judgment normal.  Nursing note and vitals reviewed.    Assessment & Plan  1. Essential hypertension BP stable on present anti-hypertensive treatment -  valsartan (DIOVAN) 160 MG tablet; Take 0.5 tablets (80 mg total) by mouth daily.  Dispense: 90 tablet; Refill: 0   Zoe Alvarado Zoe Alvarado [facility] 12/10/2016 8:43 AM

## 2016-12-11 ENCOUNTER — Telehealth: Payer: Self-pay | Admitting: Family Medicine

## 2016-12-11 NOTE — Telephone Encounter (Signed)
Was seen on yesterday and states there is a problem with her prescription. States you sent in valsartan however it has been recalled. Only have 3 pills left. Please call in something different. It is okay to leave detailed message on either phone (C) [phone] (H) [phone]

## 2016-12-11 NOTE — Telephone Encounter (Signed)
She can be started on Benicar 20 mg daily, please send a prescription for Benicar 20 mg by mouth daily #90 #0 refills to her pharmacy.

## 2016-12-11 NOTE — Telephone Encounter (Signed)
Dr. Sherryll BurgerShah, Pt was seen on yesterday and states there is a problem with her prescription. States you sent in valsartan however it has been recalled. Only have 3 pills left. Please call in something different. It is okay to leave detailed message on either phone (C) [phone] (H) [phone]

## 2016-12-12 MED ORDER — OLMESARTAN MEDOXOMIL 20 MG PO TABS: 20.0000 mg | ORAL_TABLET | Freq: Every day | ORAL | 0 refills | Status: DC

## 2016-12-12 NOTE — Telephone Encounter (Signed)
Medication has been refilled and sent to [facility] per Dr. Sherryll BurgerShah, patient has been notified

## 2016-12-12 NOTE — Telephone Encounter (Signed)
Medication has been refilled and sent to [facility] per Dr. Sherryll BurgerShah and patient has been notified

## 2017-01-15 ENCOUNTER — Ambulatory Visit (INDEPENDENT_AMBULATORY_CARE_PROVIDER_SITE_OTHER): Payer: Medicare Other | Admitting: Obstetrics and Gynecology

## 2017-01-15 ENCOUNTER — Encounter: Payer: Self-pay | Admitting: Obstetrics and Gynecology

## 2017-01-15 VITALS — BP 127/82 | HR 79 | Ht 59.0 in | Wt 143.6 lb

## 2017-01-15 DIAGNOSIS — N951 Menopausal and female climacteric states: Secondary | ICD-10-CM | POA: Insufficient documentation

## 2017-01-15 DIAGNOSIS — N3946 Mixed incontinence: Secondary | ICD-10-CM

## 2017-01-15 DIAGNOSIS — M1990 Unspecified osteoarthritis, unspecified site: Secondary | ICD-10-CM | POA: Insufficient documentation

## 2017-01-15 DIAGNOSIS — N8111 Cystocele, midline: Secondary | ICD-10-CM | POA: Diagnosis not present

## 2017-01-15 DIAGNOSIS — N952 Postmenopausal atrophic vaginitis: Secondary | ICD-10-CM

## 2017-01-15 DIAGNOSIS — Z780 Asymptomatic menopausal state: Secondary | ICD-10-CM

## 2017-01-15 DIAGNOSIS — N816 Rectocele: Secondary | ICD-10-CM

## 2017-01-15 DIAGNOSIS — M810 Age-related osteoporosis without current pathological fracture: Secondary | ICD-10-CM | POA: Insufficient documentation

## 2017-01-15 DIAGNOSIS — Z90710 Acquired absence of both cervix and uterus: Secondary | ICD-10-CM | POA: Diagnosis not present

## 2017-01-15 MED ORDER — ESTROGENS, CONJUGATED 0.625 MG/GM VA CREA: 1.0000 | TOPICAL_CREAM | Freq: Every day | VAGINAL | 12 refills | Status: DC

## 2017-01-15 NOTE — Patient Instructions (Signed)
1. Continue estradiol 0.5 mg daily 2. Begin estrogen cream intravaginal 2 times per week 3. Increase water intake, add fiber in the form of Metamucil or FiberCon and add Colace 100 mg twice a day as a stool softener 4. Continue with Kegel exercises 5. Return in 6 weeks for follow-up 6. Medications for urge incontinence were discussed but declined at this time

## 2017-01-15 NOTE — Progress Notes (Addendum)
GYN ENCOUNTER NOTE  Subjective:       Zoe Alvarado is a 71 y.o. [phone] female is here for gynecologic evaluation of the following issues:  1. Vaginal vault prolapse  Extent 71-year-old married white female para 3013, status post TAH in 1993 for abnormal uterine bleeding, history of intermittent ERT therapy over the years, currently on estradiol 0.5g a day, presents for evaluation of a bulge from the vagina.  Past obstetric history is notable for 3 vaginal deliveries, largest baby weighed 8 pounds.  GU history: Patient reports pelvic pressure. The patient does have stress urinary incontinence enough to have her wear pads daily, having to change at least 3 times a day. She does report significant urgency. She only has nocturia 1.  GI history: Bowel movements are daily. The patient does have to splint for bowel movements. She does not report chronic constipation. She does not use chronic cathartics.  The patient is sexually active; she does not report any dyspareunia.  The patient is fearful of surgical repairs which may require mesh.   Gynecologic History No LMP recorded. Patient has had a hysterectomy.TAH Contraception: status post hysterectomy Last Pap:no history of abnormal Pap smears  Obstetric History OB History  Gravida Para Term Preterm AB Living  4 1     1 3   SAB TAB Ectopic Multiple Live Births    1     3    # Outcome Date GA Lbr Len/2nd Weight Sex Delivery Anes PTL Lv  4 Gravida 1981    M Vag-Spont   LIV  3 Gravida 1978    M Vag-Spont   LIV  2 Para 1976    M Vag-Spont   LIV  1 TAB               Past Medical History:  Diagnosis Date  . Allergy   . Hyperlipidemia   . Hypertension   . Osteoporosis     Past Surgical History:  Procedure Laterality Date  . ABDOMINAL HYSTERECTOMY    . CHOLECYSTECTOMY    . DILATION AND CURETTAGE OF UTERUS    . KNEE SURGERY    . TONSILLECTOMY      Current Outpatient Prescriptions on File Prior to Visit  Medication Sig  Dispense Refill  . Calcium Carbonate-Vit D-Min (CALTRATE 600+D PLUS MINERALS) 600-800 MG-UNIT CHEW Chew by mouth.    . Cholecalciferol (VITAMIN D) 2000 UNITS CAPS Take 1 capsule by mouth daily.    Marland Kitchen. estradiol (ESTRACE) 0.5 MG tablet TAKE ONE TABLET BY MOUTH ONCE DAILY 90 tablet 4  . MULTIPLE VITAMINS-MINERALS PO Take 1 tablet by mouth daily.    Marland Kitchen. olmesartan (BENICAR) 20 MG tablet Take 1 tablet (20 mg total) by mouth daily. 90 tablet 0   No current facility-administered medications on file prior to visit.     Allergies  Allergen Reactions  . Codeine   . Penicillins     Social History   Social History  . Marital status: Married    Spouse name: N/A  . Number of children: N/A  . Years of education: N/A   Occupational History  . Not on file.   Social History Main Topics  . Smoking status: Never Smoker  . Smokeless tobacco: Never Used  . Alcohol use No  . Drug use: No  . Sexual activity: Yes    Partners: Male    Birth control/ protection: Surgical   Other Topics Concern  . Not on file   Social History Narrative  .  No narrative on file    Family History  Problem Relation Age of Onset  . Breast cancer Maternal Aunt 40  . Breast cancer Maternal Grandmother   . Diabetes Mother   . Ovarian cancer Neg Hx   . Colon cancer Neg Hx     The following portions of the patient's history were reviewed and updated as appropriate: allergies, current medications, past family history, past medical history, past social history, past surgical history and problem list.  Review of Systems Review of Systems  Constitutional: Negative for chills, diaphoresis, fever and malaise/fatigue.  HENT: Negative.   Eyes: Negative.   Respiratory: Negative.   Cardiovascular: Negative.   Gastrointestinal: Negative for abdominal pain, blood in stool, diarrhea, nausea and vomiting.       Bowel movements are regular. The patient does have to splint for bowel movements  Genitourinary:       The  patient has mixed incontinence symptoms  Musculoskeletal: Negative.   Skin: Negative.   Neurological: Negative.  Negative for weakness.  Endo/Heme/Allergies: Negative.   Psychiatric/Behavioral: Negative.     Objective:   BP 127/82   Pulse 79   Ht 4\' 11"  (1.499 m)   Wt 143 lb 9.6 oz (65.1 kg)   BMI 29.00 kg/m  CONSTITUTIONAL: Well-developed, well-nourished female in no acute distress.  HENT:  Normocephalic, atraumatic.  NECK: Normal range of motion, supple, no masses.  Normal thyroid.  SKIN: Skin is warm and dry. No rash noted. Not diaphoretic. No erythema. No pallor. NEUROLGIC: Alert and oriented to person, place, and time. PSYCHIATRIC: Normal mood and affect. Normal behavior. Normal judgment and thought content. CARDIOVASCULAR:Not Examined RESPIRATORY: Not Examined BREASTS: Not Examined BACK: No CVA tenderness or spinal tenderness ABDOMEN: Soft, non distended; Non tender.  No Organomegaly. PELVIC:  External Genitalia: Normal  BUS: Normal  Vagina: decreased estrogen effect; first degree cystocele; moderate rectocele;? Enterocele  Cervix: surgically absent  Uterus: surgically absent  Adnexa: Normal; nonpalpable and nontender  RV: Normal external exam; normal sphincter tone; no rectal masses, but firm stool noted in the rectal vault  Bladder: Nontender MUSCULOSKELETAL: Normal range of motion. No tenderness.  No cyanosis, clubbing, or edema.     Assessment:   1. 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 Rectocele, moderate, symptomatic  3. Mixed incontinence  4. Status post abdominal hysterectomy  5. Menopause  6. Vaginal atrophy     Plan:   1. Continue estradiol 0.5 mg a day 2. Begin using estrogen cream intravaginal one half to 1 g twice a week 3. Increase water intake, Colace use 100 mg twice a day, and add fiber in the form of Metamucil or FiberCon 4. Continue with Kegel exercises 5. Medication management for her urge incontinence was  discussed but declined at this time 6. Return in 6 weeks for follow-up and further management planning  A total of 25 minutes were spent face-to-face with the patient during this encounter and over half of that time involved counseling and coordination of care.  Herold Harms, MD  Note: This dictation was prepared with Dragon dictation along with smaller phrase technology. Any transcriptional errors that result from this process are unintentional.

## 2017-02-02 ENCOUNTER — Ambulatory Visit (INDEPENDENT_AMBULATORY_CARE_PROVIDER_SITE_OTHER): Payer: Medicare Other

## 2017-02-02 VITALS — BP 108/62 | HR 80 | Temp 97.5°F | Ht 59.0 in | Wt 141.3 lb

## 2017-02-02 DIAGNOSIS — Z23 Encounter for immunization: Secondary | ICD-10-CM | POA: Diagnosis not present

## 2017-02-02 DIAGNOSIS — Z0000 Encounter for general adult medical examination without abnormal findings: Secondary | ICD-10-CM | POA: Diagnosis not present

## 2017-02-02 NOTE — Progress Notes (Signed)
Subjective:   Zoe Alvarado is a 71 y.o. female who presents for an Initial Medicare Annual Wellness Visit.  Review of Systems    N/A  Cardiac Risk Factors include: advanced age (>11men, >69 women);dyslipidemia;hypertension     Objective:    Today's Vitals   02/02/17 0934  BP: 108/62  Pulse: 80  Temp: (!) 97.5 F (36.4 C)  TempSrc: Oral  Weight: 141 lb 4.8 oz (64.1 kg)  Height:  (1.499 m)  PainSc: 0-No pain   Body mass index is 28.54 kg/m.   Current Medications (verified) Outpatient Encounter Prescriptions as of 02/02/2017  Medication Sig  . Calcium Carbonate-Vit D-Min (CALTRATE 600+D PLUS MINERALS) 600-800 MG-UNIT CHEW Chew by mouth daily.   . Cholecalciferol (VITAMIN D) 2000 UNITS CAPS Take 1 capsule by mouth daily.  . diazepam (VALIUM) 5 MG tablet Take 5 mg by mouth every 6 (six) hours as needed for anxiety.  Marland Kitchen estradiol (ESTRACE) 0.5 MG tablet TAKE ONE TABLET BY MOUTH ONCE DAILY  . metroNIDAZOLE (FLAGYL) 500 MG tablet Take 500 mg by mouth 2 (two) times daily.  . MULTIPLE VITAMINS-MINERALS PO Take 1 tablet by mouth daily.  Marland Kitchen olmesartan (BENICAR) 20 MG tablet Take 1 tablet (20 mg total) by mouth daily.  . traMADol (ULTRAM) 50 MG tablet Take 50 mg by mouth every 6 (six) hours as needed.  . conjugated estrogens (PREMARIN) vaginal cream Place 1 Applicatorful vaginally daily. (Patient not taking: Reported on 02/02/2017)   No facility-administered encounter medications on file as of 02/02/2017.     Allergies (verified) Codeine and Penicillins   History: Past Medical History:  Diagnosis Date  . Allergy   . Hyperlipidemia   . Hypertension   . Osteoporosis   . Prolapse of female pelvic organs    Past Surgical History:  Procedure Laterality Date  . ABDOMINAL HYSTERECTOMY    . CHOLECYSTECTOMY    . DILATION AND CURETTAGE OF UTERUS    . KNEE SURGERY    . TONSILLECTOMY     Family History  Problem Relation Age of Onset  . Breast cancer Maternal Aunt 40  .  Breast cancer Maternal Grandmother   . Diabetes Mother   . Ovarian cancer Neg Hx   . Colon cancer Neg Hx    Social History   Occupational History  . Not on file.   Social History Main Topics  . Smoking status: Never Smoker  . Smokeless tobacco: Never Used  . Alcohol use No  . Drug use: No  . Sexual activity: Yes    Partners: Male    Birth control/ protection: Surgical    Tobacco Counseling Counseling given: Not Answered   Activities of Daily Living In your present state of health, do you have any difficulty performing the following activities: 02/02/2017 12/10/2016  Hearing? N N  Vision? Y Y  Comment cataract in left eye glasses  Difficulty concentrating or making decisions? N N  Walking or climbing stairs? Y N  Comment pain in knees -  Dressing or bathing? N N  Doing errands, shopping? N N  Preparing Food and eating ? N -  Using the Toilet? N -  In the past six months, have you accidently leaked urine? Y -  Comment due to pelvic organ prolapse, wears protection -  Do you have problems with loss of bowel control? N -  Managing your Medications? N -  Managing your Finances? N -  Housekeeping or managing your Housekeeping? N -  Some recent  data might be hidden    Immunizations and Health Maintenance Immunization History  Administered Date(s) Administered  . Influenza, High Dose Seasonal PF 02/02/2017  . Influenza-Unspecified 03/08/2015, 05/08/2016  . Pneumococcal Conjugate-13 07/31/2015   Health Maintenance Due  Topic Date Due  . Hepatitis C Screening  January 15, 1946  . TETANUS/TDAP  07/14/1966  . PNA vac Low Risk Adult (2 of 2 - PPSV23) 07/30/2016    Patient Care Team: Ellyn Hack, MD as PCP - General (Family Medicine) Defrancesco, Prentice Docker, MD as Consulting Physician (Obstetrics and Gynecology)  Indicate any recent Medical Services you may have received from other than [facility] providers in the past year (date may be approximate).     Assessment:   This  is a routine wellness examination for Tinslee.   Hearing/Vision screen Vision Screening Comments: Pt goes to [facility] center for vision checks as needed. Last exam was 1-1.5 years ago.  Dietary issues and exercise activities discussed: Current Exercise Habits: Structured exercise class (curves), Type of exercise: stretching;Other - see comments;strength training/weights (circuit), Time (Minutes): 20, Frequency (Times/Week): 3, Weekly Exercise (Minutes/Week): 60, Intensity: Mild, Exercise limited by: orthopedic condition(s)  Goals    . Weight (lb) < 130 lb (59 kg)          Recommend weight loss to reach goal of less than 130 pounds. Continue going to curves and TOPS program to reach goal.       Depression Screen PHQ 2/9 Scores 02/02/2017 12/10/2016 03/20/2016 07/31/2015 05/02/2015 03/13/2015 12/06/2014  PHQ - 2 Score 0 0 0 0 0 0 0    Fall Risk Fall Risk  02/02/2017 03/20/2016 07/31/2015 05/02/2015 03/13/2015  Falls in the past year? Yes Yes No No No  Number falls in past yr: 1 1 - - -  Injury with Fall? No No - - -  Follow up Falls prevention discussed - - - -    Cognitive Function:     6CIT Screen 02/02/2017  What Year? 0 points  What month? 0 points  What time? 0 points  Count back from 20 0 points  Months in reverse 0 points  Repeat phrase 0 points  Total Score 0    Screening Tests Health Maintenance  Topic Date Due  . Hepatitis C Screening  [birth year]  . TETANUS/TDAP  07/14/1966  . PNA vac Low Risk Adult (2 of 2 - PPSV23) 07/30/2016  . MAMMOGRAM  09/26/2018  . COLONOSCOPY  02/21/2023  . INFLUENZA VACCINE  Completed  . DEXA SCAN  Completed      Plan:  I have personally reviewed and addressed the Medicare Annual Wellness questionnaire and have noted the following in the patient's chart:  A. Medical and social history B. Use of alcohol, tobacco or illicit drugs  C. Current medications and supplements D. Functional ability and status E.  Nutritional status F.    Physical activity G. Advance directives H. List of other physicians I.  Hospitalizations, surgeries, and ER visits in previous 12 months J.  Vitals K. Screenings such as hearing and vision if needed, cognitive and depression L. Referrals and appointments - none  In addition, I have reviewed and discussed with patient certain preventive protocols, quality metrics, and best practice recommendations. A written personalized care plan for preventive services as well as general preventive health recommendations were provided to patient.  See attached scanned questionnaire for additional information.   Signed,  Hyacinth Meeker, LPN Nurse Health Advisor   MD Recommendations: Pt declined Hepatitis C screening  today (would like to have all blood work done together at next OV), tetanus and pneumonia vaccine. Pt states she will check her records to see when she last had the tetanus vaccine (thinks she received this in 2014) and only wants one vaccine today so would like to receive the pneumovax 23 at next OV too.

## 2017-02-02 NOTE — Patient Instructions (Addendum)
Zoe Alvarado , Thank you for taking time to come for your Medicare Wellness Visit. I appreciate your ongoing commitment to your health goals. Please review the following plan we discussed and let me know if I can assist you in the future.   Screening recommendations/referrals: Colonoscopy: up to date Mammogram: up to date Bone Density: up to date Recommended yearly ophthalmology/optometry visit for glaucoma screening and checkup Recommended yearly dental visit for hygiene and checkup  Vaccinations: Influenza vaccine: completed today Pneumococcal vaccine: Prevnar 13 completed, declined Pneumovax 23 today Tdap vaccine: declined Shingles vaccine: declined  Advanced directives: Please bring a copy of your POA (Power of Attorney) and/or Living Will to your next appointment.   Conditions/risks identified: Fall risk prevention; Recommend weight loss to reach goal of less than 130 pounds. Continue going to curves and TOPS program to reach goal.   Next appointment: Pt to schedule follow up with PCP today.   Preventive Care 69 Years and Older, Female Preventive care refers to lifestyle choices and visits with your health care provider that can promote health and wellness. What does preventive care include?  A yearly physical exam. This is also called an annual well check.  Dental exams once or twice a year.  Routine eye exams. Ask your health care provider how often you should have your eyes checked.  Personal lifestyle choices, including:  Daily care of your teeth and gums.  Regular physical activity.  Eating a healthy diet.  Avoiding tobacco and drug use.  Limiting alcohol use.  Practicing safe sex.  Taking low-dose aspirin every day.  Taking vitamin and mineral supplements as recommended by your health care provider. What happens during an annual well check? The services and screenings done by your health care provider during your annual well check will depend on your age,  overall health, lifestyle risk factors, and family history of disease. Counseling  Your health care provider may ask you questions about your:  Alcohol use.  Tobacco use.  Drug use.  Emotional well-being.  Home and relationship well-being.  Sexual activity.  Eating habits.  History of falls.  Memory and ability to understand (cognition).  Work and work Astronomer.  Reproductive health. Screening  You may have the following tests or measurements:  Height, weight, and BMI.  Blood pressure.  Lipid and cholesterol levels. These may be checked every 5 years, or more frequently if you are over 36 years old.  Skin check.  Lung cancer screening. You may have this screening every year starting at age 67 if you have a 30-pack-year history of smoking and currently smoke or have quit within the past 15 years.  Fecal occult blood test (FOBT) of the stool. You may have this test every year starting at age 1.  Flexible sigmoidoscopy or colonoscopy. You may have a sigmoidoscopy every 5 years or a colonoscopy every 10 years starting at age 13.  Hepatitis C blood test.  Hepatitis B blood test.  Sexually transmitted disease (STD) testing.  Diabetes screening. This is done by checking your blood sugar (glucose) after you have not eaten for a while (fasting). You may have this done every 1-3 years.  Bone density scan. This is done to screen for osteoporosis. You may have this done starting at age 23.  Mammogram. This may be done every 1-2 years. Talk to your health care provider about how often you should have regular mammograms. Talk with your health care provider about your test results, treatment options, and if necessary, the  need for more tests. Vaccines  Your health care provider may recommend certain vaccines, such as:  Influenza vaccine. This is recommended every year.  Tetanus, diphtheria, and acellular pertussis (Tdap, Td) vaccine. You may need a Td booster every 10  years.  Zoster vaccine. You may need this after age 60.  Pneumococcal 13-valent conjugate (PCV13) vaccine. One dose is recommended after age 61.  Pneumococcal polysaccharide (PPSV23) vaccine. One dose is recommended after age 12. Talk to your health care provider about which screenings and vaccines you need and how often you need them. This information is not intended to replace advice given to you by your health care provider. Make sure you discuss any questions you have with your health care provider. Document Released: 06/01/2015 Document Revised: 01/23/2016 Document Reviewed: 03/06/2015 Elsevier Interactive Patient Education  2017 [place] Prevention in the Home Falls can cause injuries. They can happen to people of all ages. There are many things you can do to make your home safe and to help prevent falls. What can I do on the outside of my home?  Regularly fix the edges of walkways and driveways and fix any cracks.  Remove anything that might make you trip as you walk through a door, such as a raised step or threshold.  Trim any bushes or trees on the path to your home.  Use bright outdoor lighting.  Clear any walking paths of anything that might make someone trip, such as rocks or tools.  Regularly check to see if handrails are loose or broken. Make sure that both sides of any steps have handrails.  Any raised decks and porches should have guardrails on the edges.  Have any leaves, snow, or ice cleared regularly.  Use sand or salt on walking paths during winter.  Clean up any spills in your garage right away. This includes oil or grease spills. What can I do in the bathroom?  Use night lights.  Install grab bars by the toilet and in the tub and shower. Do not use towel bars as grab bars.  Use non-skid mats or decals in the tub or shower.  If you need to sit down in the shower, use a plastic, non-slip stool.  Keep the floor dry. Clean up any water that  spills on the floor as soon as it happens.  Remove soap buildup in the tub or shower regularly.  Attach bath mats securely with double-sided non-slip rug tape.  Do not have throw rugs and other things on the floor that can make you trip. What can I do in the bedroom?  Use night lights.  Make sure that you have a light by your bed that is easy to reach.  Do not use any sheets or blankets that are too big for your bed. They should not hang down onto the floor.  Have a firm chair that has side arms. You can use this for support while you get dressed.  Do not have throw rugs and other things on the floor that can make you trip. What can I do in the kitchen?  Clean up any spills right away.  Avoid walking on wet floors.  Keep items that you use a lot in easy-to-reach places.  If you need to reach something above you, use a strong step stool that has a grab bar.  Keep electrical cords out of the way.  Do not use floor polish or wax that makes floors slippery. If you must use wax,  use non-skid floor wax.  Do not have throw rugs and other things on the floor that can make you trip. What can I do with my stairs?  Do not leave any items on the stairs.  Make sure that there are handrails on both sides of the stairs and use them. Fix handrails that are broken or loose. Make sure that handrails are as long as the stairways.  Check any carpeting to make sure that it is firmly attached to the stairs. Fix any carpet that is loose or worn.  Avoid having throw rugs at the top or bottom of the stairs. If you do have throw rugs, attach them to the floor with carpet tape.  Make sure that you have a light switch at the top of the stairs and the bottom of the stairs. If you do not have them, ask someone to add them for you. What else can I do to help prevent falls?  Wear shoes that:  Do not have high heels.  Have rubber bottoms.  Are comfortable and fit you well.  Are closed at the  toe. Do not wear sandals.  If you use a stepladder:  Make sure that it is fully opened. Do not climb a closed stepladder.  Make sure that both sides of the stepladder are locked into place.  Ask someone to hold it for you, if possible.  Clearly mark and make sure that you can see:  Any grab bars or handrails.  First and last steps.  Where the edge of each step is.  Use tools that help you move around (mobility aids) if they are needed. These include:  Canes.  Walkers.  Scooters.  Crutches.  Turn on the lights when you go into a dark area. Replace any light bulbs as soon as they burn out.  Set up your furniture so you have a clear path. Avoid moving your furniture around.  If any of your floors are uneven, fix them.  If there are any pets around you, be aware of where they are.  Review your medicines with your doctor. Some medicines can make you feel dizzy. This can increase your chance of falling. Ask your doctor what other things that you can do to help prevent falls. This information is not intended to replace advice given to you by your health care provider. Make sure you discuss any questions you have with your health care provider. Document Released: 03/01/2009 Document Revised: 10/11/2015 Document Reviewed: 06/09/2014 Elsevier Interactive Patient Education  2017 [employer].

## 2017-02-11 ENCOUNTER — Encounter: Payer: Medicare Other | Admitting: Family Medicine

## 2017-02-26 ENCOUNTER — Encounter: Payer: Medicare Other | Admitting: Obstetrics and Gynecology

## 2017-03-10 ENCOUNTER — Other Ambulatory Visit: Payer: Self-pay

## 2017-03-11 MED ORDER — OLMESARTAN MEDOXOMIL 20 MG PO TABS: 20.0000 mg | ORAL_TABLET | Freq: Every day | ORAL | 0 refills | Status: DC

## 2017-04-20 ENCOUNTER — Ambulatory Visit
Admission: RE | Admit: 2017-04-20 | Discharge: 2017-04-20 | Disposition: A | Discharge status: Home / Self Care | Payer: Medicare Other | Source: Ambulatory Visit | Attending: Family Medicine | Admitting: Family Medicine

## 2017-04-20 ENCOUNTER — Other Ambulatory Visit: Payer: Self-pay | Admitting: Family Medicine

## 2017-04-20 ENCOUNTER — Encounter: Payer: Self-pay | Admitting: Family Medicine

## 2017-04-20 ENCOUNTER — Ambulatory Visit: Payer: Self-pay | Admitting: *Deleted

## 2017-04-20 ENCOUNTER — Ambulatory Visit (INDEPENDENT_AMBULATORY_CARE_PROVIDER_SITE_OTHER): Payer: Medicare Other | Admitting: Family Medicine

## 2017-04-20 ENCOUNTER — Telehealth: Payer: Self-pay

## 2017-04-20 VITALS — BP 104/60 | Temp 98.3°F | Ht 59.0 in | Wt 135.7 lb

## 2017-04-20 DIAGNOSIS — Z9049 Acquired absence of other specified parts of digestive tract: Secondary | ICD-10-CM | POA: Diagnosis not present

## 2017-04-20 DIAGNOSIS — R1084 Generalized abdominal pain: Secondary | ICD-10-CM | POA: Diagnosis not present

## 2017-04-20 DIAGNOSIS — K5900 Constipation, unspecified: Secondary | ICD-10-CM

## 2017-04-20 LAB — HEMOCCULT GUIAC POC 1CARD (OFFICE): FECAL OCCULT BLD: NEGATIVE

## 2017-04-20 MED ORDER — OMEPRAZOLE 40 MG PO CPDR: 40.0000 mg | DELAYED_RELEASE_CAPSULE | Freq: Every day | ORAL | 0 refills | Status: DC

## 2017-04-20 MED ORDER — DOCUSATE SODIUM 100 MG PO CAPS: 100.0000 mg | ORAL_CAPSULE | Freq: Two times a day (BID) | ORAL | 0 refills | Status: DC

## 2017-04-20 NOTE — Telephone Encounter (Signed)
She had a dental implant inserted on Friday.   She was given Tramadol and Advil to take for pain.   She took it for about 24 hours.   Her abd is now painful across the upper portion and cramping along the bottom of her abd.  She has not eaten much or had any appetite for 2 days.  She did take a Dulcolax Saturday and had 4 BMs which helped a little but not much.  Temp is 99.6 now it was 100.1 at its highest.   She called her dentist who told her she should report to her medial doctor regarding the abd pains. The agent got her an appt with Maurice SmallEmily Boyce for 9:40 this morning.   I instructed her not to eat or drink anything before going to the appt.   She verbalized understanding.  Reason for Disposition . [1] MILD-MODERATE pain AND [2] constant AND [3] present > 2 hours  Answer Assessment - Initial Assessment Questions 1. LOCATION: "Where does it hurt?"      Started upper middle stomach all the way across.   Then spread to lower part. 2. RADIATION: "Does the pain shoot anywhere else?" (e.g., chest, back)     Just in my abdomen 3. ONSET: "When did the pain begin?" (e.g., minutes, hours or days ago)      Had surgery implant on Friday.   Saturday evening started having abd pain bad.   I couldn't get out of bed. 4. SUDDEN: "Gradual or sudden onset?"     Suddenly 5. PATTERN "Does the pain come and go, or is it constant?"    - If constant: "Is it getting better, staying the same, or worsening?"      (Note: Constant means the pain never goes away completely; most serious pain is constant and it progresses)     - If intermittent: "How long does it last?" "Do you have pain now?"     (Note: Intermittent means the pain goes away completely between bouts)     Have waves that feel like cramps in lower abd.  Upper abd is constant.  My abd is tender and hurts when I press on it. 6. SEVERITY: "How bad is the pain?"  (e.g., Scale 1-10; mild, moderate, or severe)   - MILD (1-3): doesn't interfere with normal  activities, abdomen soft and not tender to touch    - MODERATE (4-7): interferes with normal activities or awakens from sleep, tender to touch    - SEVERE (8-10): excruciating pain, doubled over, unable to do any normal activities      Had gallbladder removed years ago.   Not been exposed to anyone sick.   7. RECURRENT SYMPTOM: "Have you ever had this type of abdominal pain before?" If so, ask: "When was the last time?" and "What happened that time?"      I was nauseas no vomiting. 8. CAUSE: "What do you think is causing the abdominal pain?"     I think it's the medication causing the abd pain.   I'm taking Advil and Tramadol since the tooth implant on Friday.   9. RELIEVING/AGGRAVATING FACTORS: "What makes it better or worse?" (e.g., movement, antacids, bowel movement)     Had a 4 BMs yesterday.  Felt releif after the last 2 movements.   I've not eaten much for the last 2 days. 10. OTHER SYMPTOMS: "Has there been any vomiting, diarrhea, constipation, or urine problems?"       No burning with urination.  Been nauseas but no vomiting or diarhea.  I have an organ prolapse in my abd that needs surgery. 11. PREGNANCY: "Is there any chance you are pregnant?" "When was your last menstrual period?"       Not asked due to age.  Protocols used: ABDOMINAL PAIN [facility]- FEMALE-A-AH

## 2017-04-20 NOTE — Progress Notes (Addendum)
Name: Donell SievertJudy J Grieves   MRN: [id number]    DOB: May 23, 1945   Date:04/20/2017       Progress Note  Subjective  Chief Complaint  Chief Complaint  Patient presents with  . Medication Reaction    Pt had oral surgery on Friday and they had her rotating between Advil and Tramadol for 24 hours, she has finished the meds, her sx started on saturday wuth upper abominal pain, and states she noticed some discomfort when she took a breath, her fever was 100.1 yesterday and took tylenol, Denies vomitting,diarrhea, hives    HPI  Patient had left upper dental implant procedure 3 days ago at her dentist office. Was given a chart on how to take pain medication to help after her procedure - had 7 advil and 2 Tramadol during the following 24 hour period.  At home she had temp of 99.46F along with abdominal pain (started in [place], then became more generalized - constant ache that waxes and wanes), decreased appetite, and significant nausea..  Denies blood in stool, dark/tarry stools, or vomiting; no dysuria, increased urinary frequency.  - Last BM yesterday (had 4 BM's [hard at first, then formed] after taking a Dulcolax the day prior) and this relieved some of her pain.  She took a laxative yesterday evening, but has not had a BM since taking this medication.  - Currently no longer having nausea, endorses fatigue, decreased appetite, ongoing abdominal pain.  Recently diagnosed with pelvic organ prolapse that includes the rectum. Sees Dr. Greggory Keenefrancesco, and at last visit on 01/15/2017 was told to take Colace 100mg  twice daily and Metamucil or FiberCon along with increased water and fiber intake.  She did not start Colace or Metamucil.   Patient Active Problem List   Diagnosis Date Noted  . OP (osteoporosis) 01/15/2017  . Arthritis, degenerative 01/15/2017  . Climacteric 01/15/2017  . Acute non-recurrent maxillary sinusitis 03/20/2016  . Obesity (BMI 30.0-34.9) 05/02/2015  . Hypertension 12/06/2014  . Dyslipidemia  12/06/2014    Social History   Tobacco Use  . Smoking status: Never Smoker  . Smokeless tobacco: Never Used  Substance Use Topics  . Alcohol use: No    Alcohol/week: 0.0 oz     Current Outpatient Medications:  .  Calcium Carbonate-Vit D-Min (CALTRATE 600+D PLUS MINERALS) 600-800 MG-UNIT CHEW, Chew by mouth daily. , Disp: , Rfl:  .  Cholecalciferol (VITAMIN D) 2000 UNITS CAPS, Take 1 capsule by mouth daily., Disp: , Rfl:  .  estradiol (ESTRACE) 0.5 MG tablet, TAKE ONE TABLET BY MOUTH ONCE DAILY, Disp: 90 tablet, Rfl: 4 .  MULTIPLE VITAMINS-MINERALS PO, Take 1 tablet by mouth daily., Disp: , Rfl:  .  olmesartan (BENICAR) 20 MG tablet, Take 1 tablet (20 mg total) by mouth daily., Disp: 90 tablet, Rfl: 0 .  conjugated estrogens (PREMARIN) vaginal cream, Place 1 Applicatorful vaginally daily. (Patient not taking: Reported on 02/02/2017), Disp: 42.5 g, Rfl: 12 .  diazepam (VALIUM) 5 MG tablet, Take 5 mg by mouth every 6 (six) hours as needed for anxiety., Disp: , Rfl:  .  metroNIDAZOLE (FLAGYL) 500 MG tablet, Take 500 mg by mouth 2 (two) times daily., Disp: , Rfl:  .  omeprazole (PRILOSEC) 40 MG capsule, Take 1 capsule (40 mg total) by mouth daily., Disp: 30 capsule, Rfl: 0 .  traMADol (ULTRAM) 50 MG tablet, Take 50 mg by mouth every 6 (six) hours as needed., Disp: , Rfl:   Allergies  Allergen Reactions  . Codeine   .  Penicillins     ROS  Constitutional: Negative for fever or unintentional weight change.  Respiratory: Negative for cough and shortness of breath.   Cardiovascular: Negative for chest pain or palpitations.  Gastrointestinal: See HPI  Musculoskeletal: Negative for gait problem or joint swelling.  Skin: Negative for rash.  Neurological: Negative for dizziness, lightheadedness, or headache.  No other specific complaints in a complete review of systems (except as listed in HPI above).  Objective  Vitals:   04/20/17 0948  BP: 104/60  Temp: 98.3 F (36.8 C)  TempSrc:  Oral  SpO2: 97%  Weight: 135 lb 11.2 oz (61.6 kg)  Height: 4\' 11"  (1.499 m)   Body mass index is 27.41 kg/m.  Nursing Note and Vital Signs reviewed.  Physical Exam  Constitutional: Patient appears well-developed and well-nourished. No distress.  HEENT: head atraumatic, normocephalic Cardiovascular: Normal rate, regular rhythm, S1/S2 present.  No murmur or rub heard. No BLE edema. Pulmonary/Chest: Effort normal and breath sounds clear. No respiratory distress or retractions. Abdominal: Soft and mild generalized tenderness - no distension, bowel sounds present x4 quadrants and slightly hypoactive.  No CVA Tenderness Psychiatric: Patient has a normal mood and affect. behavior is normal. Judgment and thought content normal.  Recent Results (from the past 2160 hour(s))  POCT Occult Blood Stool     Status: Normal   Collection Time: 04/20/17 10:54 AM  Result Value Ref Range   Fecal Occult Blood, POC Negative Negative   Card #1 Date     Card #2 Fecal Occult Blod, POC     Card #2 Date     Card #3 Fecal Occult Blood, POC     Card #3 Date       Assessment & Plan  1. Generalized abdominal pain - Lipase - CBC - COMPLETE METABOLIC PANEL WITH GFR - DG Abd 1 View; Future - omeprazole (PRILOSEC) 40 MG capsule; Take 1 capsule (40 mg total) by mouth daily.  Dispense: 30 capsule; Refill: 0 - POCT Occult Blood Stool - Negative  2. Constipation, unspecified constipation type - Advised to drink plenty water and eat plenty of fiber. Recommendations from Dr. Greggory Keenefrancesco discussed in detail including use of Colace and Matamucil. - DG Abd 1 View; Future - POCT Occult Blood Stool - Negative  -Red flags and when to present for emergency care or RTC including fever >101.61F, chest pain, shortness of breath, new/worsening/un-resolving symptoms, abdominal distension, severe abdominal pain, malaise, reviewed with patient at time of visit. Follow up and care instructions discussed and provided in  AVS.

## 2017-04-20 NOTE — Telephone Encounter (Signed)
-----   Message from Doren CustardEmily E Boyce, FNP sent at 04/20/2017  3:15 PM EST ----- Please call patient - large stool burden consistent with constipation - please advise that she begin colace 100mg  per Dr. Bridget Hartshornefrancesco's recommendation. I have sent this in to the pharmacy. Please also advise that she begin Metamucil to help with her constipation. Thanks!

## 2017-04-20 NOTE — Patient Instructions (Addendum)

## 2017-04-20 NOTE — Telephone Encounter (Signed)
Spoke with pt and informed her of Emily's message. Pt understood and had no additional questions at this time. Nothing further is needed

## 2017-04-20 NOTE — Telephone Encounter (Signed)
A high fiber diet - whole grains, plenty of fruits and vegetables - especially dark/leafy greens, drinking plenty of water are all recommended for her constipation. She may start the Colace tomorrow instead of today, this should not interfere with her stool softener that she already took as her Xray shows that she is quite constipated. Please also advise that she follow up in 2-3 days if she is still having pain.  Labs are pending- we will call her once available. Thanks!

## 2017-04-20 NOTE — Telephone Encounter (Signed)
Zoe BurtonEmily,  I spoke with pt and informed her of results. Pt understood but had additional questions. She states she forgot to mention that the stool softer/laxative was good for 12-72 hours..so she was unsure when it was ok for her to start taking the colace. Also she was concerned about her appetite, I did mention the BRAT diet but she wanted to know would you think she would benefit from eating Yogurt or would this cause any problems

## 2017-04-21 ENCOUNTER — Telehealth: Payer: Self-pay | Admitting: Family Medicine

## 2017-04-21 ENCOUNTER — Telehealth: Payer: Self-pay

## 2017-04-21 ENCOUNTER — Other Ambulatory Visit: Payer: Self-pay | Admitting: Family Medicine

## 2017-04-21 DIAGNOSIS — D72829 Elevated white blood cell count, unspecified: Secondary | ICD-10-CM

## 2017-04-21 LAB — COMPLETE METABOLIC PANEL WITH GFR
AG RATIO: 1.5 (calc) (ref 1.0–2.5)
ALT: 12 U/L (ref 6–29)
AST: 15 U/L (ref 10–35)
Albumin: 3.9 g/dL (ref 3.6–5.1)
Alkaline phosphatase (APISO): 93 U/L (ref 33–130)
BUN: 7 mg/dL (ref 7–25)
CALCIUM: 8.9 mg/dL (ref 8.6–10.4)
CO2: 27 mmol/L (ref 20–32)
Chloride: 101 mmol/L (ref 98–110)
Creat: 0.52 mg/dL (ref 0.50–0.99)
GFR, EST AFRICAN AMERICAN: 113 mL/min/{1.73_m2} (ref >=60)
GFR, EST NON AFRICAN AMERICAN: 97 mL/min/{1.73_m2} (ref >=60)
Globulin: 2.6 g/dL (calc) (ref 1.9–3.7)
Glucose, Bld: 72 mg/dL (ref 65–99)
POTASSIUM: 3.5 mmol/L (ref 3.5–5.3)
Sodium: 137 mmol/L (ref 135–146)
TOTAL PROTEIN: 6.5 g/dL (ref 6.1–8.1)
Total Bilirubin: 1.5 mg/dL — ABNORMAL HIGH (ref 0.2–1.2)

## 2017-04-21 LAB — CBC
HCT: 39.1 % (ref 35.0–45.0)
HEMOGLOBIN: 13.5 g/dL (ref 11.7–15.5)
MCH: 29.6 pg (ref 27.0–33.0)
MCHC: 34.5 g/dL (ref 32.0–36.0)
MCV: 85.7 fL (ref 80.0–100.0)
MPV: 10 fL (ref 7.5–12.5)
Platelets: 247 10*3/uL (ref 140–400)
RBC: 4.56 10*6/uL (ref 3.80–5.10)
RDW: 11.7 % (ref 11.0–15.0)
WBC: 14.6 10*3/uL — ABNORMAL HIGH (ref 3.8–10.8)

## 2017-04-21 LAB — LIPASE: LIPASE: 5 U/L — AB (ref 7–60)

## 2017-04-21 NOTE — Telephone Encounter (Signed)
-----   Message from Doren CustardEmily E Boyce, FNP sent at 04/21/2017  8:32 AM EST ----- Lipase low which is not of concern. CBC shows elevated WBC count - This could be incidental finding, but we need to recheck this test tomorrow along with a Sed rate to check for inflammation - if the white count is rising after re-check tomorrow, we may need to treat for infection. Metabolic panel is normal except for a slightly elevated bilirubin.  I have placed the orders for CBC and Sed Rate.  Thanks!

## 2017-04-21 NOTE — Telephone Encounter (Signed)
Copied from CRM [phone]#[id number]. Topic: Inquiry >> Apr 21, 2017 11:04 AM Anice PaganiniMunoz, Sharece Fleischhacker I, NT wrote: Reason for CRM: Pt call back for lab result,thanks

## 2017-04-21 NOTE — Telephone Encounter (Signed)
I tried to contact this patient to review the results from her most recent labs, but there was no answer. A message was left for the patient to give us a call when she got the chance.   

## 2017-04-21 NOTE — Telephone Encounter (Signed)
I returned this patient's called and after she verified her date of birth labs were reviewed. Patient then was asked to returned to office for repeat labs.  Patient stated that she had a dental procedure this past Friday and that she was still having a little discomfort, but will be in tomorrow for repeat labs.

## 2017-04-22 DIAGNOSIS — D72829 Elevated white blood cell count, unspecified: Secondary | ICD-10-CM | POA: Diagnosis not present

## 2017-04-23 LAB — CBC WITH DIFFERENTIAL/PLATELET
Basophils Absolute: 49 cells/uL (ref 0–200)
Basophils Relative: 0.7 %
Eosinophils Absolute: 91 cells/uL (ref 15–500)
Eosinophils Relative: 1.3 %
HEMATOCRIT: 34.9 % — AB (ref 35.0–45.0)
Hemoglobin: 12.3 g/dL (ref 11.7–15.5)
Lymphs Abs: 910 cells/uL (ref 850–3900)
MCH: 29.9 pg (ref 27.0–33.0)
MCHC: 35.2 g/dL (ref 32.0–36.0)
MCV: 84.7 fL (ref 80.0–100.0)
MPV: 10.2 fL (ref 7.5–12.5)
Monocytes Relative: 8.3 %
NEUTROS PCT: 76.7 %
Neutro Abs: 5369 cells/uL (ref 1500–7800)
Platelets: 260 10*3/uL (ref 140–400)
RBC: 4.12 10*6/uL (ref 3.80–5.10)
RDW: 11.7 % (ref 11.0–15.0)
TOTAL LYMPHOCYTE: 13 %
WBC mixed population: 581 cells/uL (ref 200–950)
WBC: 7 10*3/uL (ref 3.8–10.8)

## 2017-04-23 LAB — SEDIMENTATION RATE: SED RATE: 9 mm/h (ref 0–30)

## 2017-04-23 NOTE — Telephone Encounter (Signed)
-----   Message from Doren CustardEmily E Boyce, FNP sent at 04/23/2017 10:42 AM EST ----- Please let patient know that her repeat CBC shows a great improvement in her white blood cell count - I believe the elevation was likely reaction to a viral illness or to some mild inflammation that has since resolved - very good news.   ##We are still awaiting the Sed rate - could you please call lab and check on this?##  Thanks!

## 2017-04-24 ENCOUNTER — Encounter: Payer: Self-pay | Admitting: Family Medicine

## 2017-04-24 ENCOUNTER — Ambulatory Visit (INDEPENDENT_AMBULATORY_CARE_PROVIDER_SITE_OTHER): Payer: Medicare Other | Admitting: Family Medicine

## 2017-04-24 VITALS — BP 118/72 | HR 96 | Temp 97.4°F | Resp 14 | Ht 59.0 in | Wt 135.8 lb

## 2017-04-24 DIAGNOSIS — R17 Unspecified jaundice: Secondary | ICD-10-CM | POA: Diagnosis not present

## 2017-04-24 DIAGNOSIS — R1013 Epigastric pain: Secondary | ICD-10-CM | POA: Diagnosis not present

## 2017-04-24 NOTE — Progress Notes (Signed)
Name: Zoe Alvarado   MRN: [id number]    DOB: January 05, 1946   Date:04/24/2017       Progress Note  Subjective  Chief Complaint  Chief Complaint  Patient presents with  . Abdominal Pain    follow up; still have on and off pain but pt states she feels much better     Abdominal Pain    Much better, started with taking Advil for dental work, started on Omeprazole in suspicion for gastritis, now better, no evidence of bleeding.   Past Medical History:  Diagnosis Date  . Allergy   . Hyperlipidemia   . Hypertension   . Osteoporosis   . Prolapse of female pelvic organs     Past Surgical History:  Procedure Laterality Date  . ABDOMINAL HYSTERECTOMY    . CHOLECYSTECTOMY    . DENTAL SURGERY    . DILATION AND CURETTAGE OF UTERUS    . KNEE SURGERY    . TONSILLECTOMY      Family History  Problem Relation Age of Onset  . Breast cancer Maternal Aunt 38  . Breast cancer Maternal Grandmother   . Diabetes Mother   . Ovarian cancer Neg Hx   . Colon cancer Neg Hx     Social History   Socioeconomic History  . Marital status: Married    Spouse name: Not on file  . Number of children: Not on file  . Years of education: Not on file  . Highest education level: Not on file  Social Needs  . Financial resource strain: Not on file  . Food insecurity - worry: Not on file  . Food insecurity - inability: Not on file  . Transportation needs - medical: Not on file  . Transportation needs - non-medical: Not on file  Occupational History  . Not on file  Tobacco Use  . Smoking status: Never Smoker  . Smokeless tobacco: Never Used  Substance and Sexual Activity  . Alcohol use: No    Alcohol/week: 0.0 oz  . Drug use: No  . Sexual activity: Yes    Partners: Male    Birth control/protection: Surgical  Other Topics Concern  . Not on file  Social History Narrative  . Not on file     Current Outpatient Medications:  .  Calcium Carbonate-Vit D-Min (CALTRATE 600+D PLUS MINERALS) 600-800  MG-UNIT CHEW, Chew by mouth daily. , Disp: , Rfl:  .  Cholecalciferol (VITAMIN D) 2000 UNITS CAPS, Take 1 capsule by mouth daily., Disp: , Rfl:  .  diazepam (VALIUM) 5 MG tablet, Take 5 mg by mouth every 6 (six) hours as needed for anxiety., Disp: , Rfl:  .  docusate sodium (COLACE) 100 MG capsule, Take 1 capsule (100 mg total) by mouth 2 (two) times daily., Disp: 30 capsule, Rfl: 0 .  estradiol (ESTRACE) 0.5 MG tablet, TAKE ONE TABLET BY MOUTH ONCE DAILY, Disp: 90 tablet, Rfl: 4 .  metroNIDAZOLE (FLAGYL) 500 MG tablet, Take 500 mg by mouth 2 (two) times daily., Disp: , Rfl:  .  MULTIPLE VITAMINS-MINERALS PO, Take 1 tablet by mouth daily., Disp: , Rfl:  .  olmesartan (BENICAR) 20 MG tablet, Take 1 tablet (20 mg total) by mouth daily., Disp: 90 tablet, Rfl: 0 .  omeprazole (PRILOSEC) 40 MG capsule, Take 1 capsule (40 mg total) by mouth daily., Disp: 30 capsule, Rfl: 0 .  conjugated estrogens (PREMARIN) vaginal cream, Place 1 Applicatorful vaginally daily. (Patient not taking: Reported on 02/02/2017), Disp: 42.5 g, Rfl: 12 .  traMADol (ULTRAM) 50 MG tablet, Take 50 mg by mouth every 6 (six) hours as needed., Disp: , Rfl:   Allergies  Allergen Reactions  . Codeine   . Penicillins      Review of Systems  Gastrointestinal: Positive for abdominal pain.      Objective  Vitals:   04/24/17 1113  BP: 118/72  Pulse: 96  Resp: 14  Temp: (!) 97.4 F (36.3 C)  TempSrc: Oral  SpO2: 99%  Weight: 135 lb 12.8 oz (61.6 kg)  Height: '4\' 11"'  (1.499 m)    Physical Exam  Constitutional: She is oriented to person, place, and time and well-developed, well-nourished, and in no distress.  HENT:  Head: Normocephalic and atraumatic.  Cardiovascular: Normal rate, regular rhythm and normal heart sounds.  No murmur heard. Pulmonary/Chest: Effort normal and breath sounds normal. She has no wheezes.  Abdominal: Soft. Bowel sounds are normal. There is tenderness in the epigastric area.  Neurological: She  is alert and oriented to person, place, and time.  Psychiatric: Mood, memory, affect and judgment normal.  Nursing note and vitals reviewed.    Recent Results (from the past 2160 hour(s))  Lipase     Status: Abnormal   Collection Time: 04/20/17 10:36 AM  Result Value Ref Range   Lipase 5 (L) 7 - 60 U/L    Comment: Verified by repeat analysis. .   CBC     Status: Abnormal   Collection Time: 04/20/17 10:36 AM  Result Value Ref Range   WBC 14.6 (H) 3.8 - 10.8 Thousand/uL   RBC 4.56 3.80 - 5.10 Million/uL   Hemoglobin 13.5 11.7 - 15.5 g/dL   HCT 39.1 35.0 - 45.0 %   MCV 85.7 80.0 - 100.0 fL   MCH 29.6 27.0 - 33.0 pg   MCHC 34.5 32.0 - 36.0 g/dL   RDW 11.7 11.0 - 15.0 %   Platelets 247 140 - 400 Thousand/uL   MPV 10.0 7.5 - 12.5 fL  COMPLETE METABOLIC PANEL WITH GFR     Status: Abnormal   Collection Time: 04/20/17 10:36 AM  Result Value Ref Range   Glucose, Bld 72 65 - 99 mg/dL    Comment: .            Fasting reference interval .    BUN 7 7 - 25 mg/dL   Creat 0.52 0.50 - 0.99 mg/dL    Comment: For patients >81 years of age, the reference limit for Creatinine is approximately 13% higher for people identified as African-American. .    GFR, Est Non African American 97 > OR = 60 mL/min/1.25m   GFR, Est African American 113 > OR = 60 mL/min/1.783m  BUN/Creatinine Ratio NOT APPLICABLE 6 - 22 (calc)   Sodium 137 135 - 146 mmol/L   Potassium 3.5 3.5 - 5.3 mmol/L   Chloride 101 98 - 110 mmol/L   CO2 27 20 - 32 mmol/L   Calcium 8.9 8.6 - 10.4 mg/dL   Total Protein 6.5 6.1 - 8.1 g/dL   Albumin 3.9 3.6 - 5.1 g/dL   Globulin 2.6 1.9 - 3.7 g/dL (calc)   AG Ratio 1.5 1.0 - 2.5 (calc)   Total Bilirubin 1.5 (H) 0.2 - 1.2 mg/dL   Alkaline phosphatase (APISO) 93 33 - 130 U/L   AST 15 10 - 35 U/L   ALT 12 6 - 29 U/L  POCT Occult Blood Stool     Status: Normal   Collection Time: 04/20/17 10:54 AM  Result Value Ref Range   Fecal Occult Blood, POC Negative Negative   Card #1 Date      Card #2 Fecal Occult Blod, POC     Card #2 Date     Card #3 Fecal Occult Blood, POC     Card #3 Date    Sed Rate (ESR)     Status: None   Collection Time: 04/22/17 10:07 AM  Result Value Ref Range   Sed Rate 9 0 - 30 mm/h  CBC w/Diff/Platelet     Status: Abnormal   Collection Time: 04/22/17 10:07 AM  Result Value Ref Range   WBC 7.0 3.8 - 10.8 Thousand/uL   RBC 4.12 3.80 - 5.10 Million/uL   Hemoglobin 12.3 11.7 - 15.5 g/dL   HCT 34.9 (L) 35.0 - 45.0 %   MCV 84.7 80.0 - 100.0 fL   MCH 29.9 27.0 - 33.0 pg   MCHC 35.2 32.0 - 36.0 g/dL   RDW 11.7 11.0 - 15.0 %   Platelets 260 140 - 400 Thousand/uL   MPV 10.2 7.5 - 12.5 fL   Neutro Abs 5,369 1,500 - 7,800 cells/uL   Lymphs Abs 910 850 - 3,900 cells/uL   WBC mixed population 581 200 - 950 cells/uL   Eosinophils Absolute 91 15 - 500 cells/uL   Basophils Absolute 49 0 - 200 cells/uL   Neutrophils Relative % 76.7 %   Total Lymphocyte 13.0 %   Monocytes Relative 8.3 %   Eosinophils Relative 1.3 %   Basophils Relative 0.7 %     Assessment & Plan  1. Total bilirubin, elevated Elevated total bilirubin level to 1.5 mg/dL on recent lab work, advised to return in 2 weeks to repeat levels and if still elevated, will need workup for hyperbilirubinemia. - COMPLETE METABOLIC PANEL WITH GFR  2. Intermittent epigastric abdominal pain Most likely NSAID induced gastritis, she has stopped taking ibuprofen and now taking omeprazole, symptoms seem to be resolving.  Reassured and reviewed lab work  [employer] [facility] 04/24/2017 11:31 AM

## 2017-05-15 DIAGNOSIS — R17 Unspecified jaundice: Secondary | ICD-10-CM | POA: Diagnosis not present

## 2017-05-15 LAB — COMPLETE METABOLIC PANEL WITH GFR
AG Ratio: 1.6 (calc) (ref 1.0–2.5)
ALBUMIN MSPROF: 3.9 g/dL (ref 3.6–5.1)
ALKALINE PHOSPHATASE (APISO): 96 U/L (ref 33–130)
ALT: 10 U/L (ref 6–29)
AST: 13 U/L (ref 10–35)
BUN: 9 mg/dL (ref 7–25)
CALCIUM: 8.9 mg/dL (ref 8.6–10.4)
CO2: 27 mmol/L (ref 20–32)
CREATININE: 0.52 mg/dL (ref 0.50–0.99)
Chloride: 106 mmol/L (ref 98–110)
GFR, EST AFRICAN AMERICAN: 113 mL/min/{1.73_m2} (ref >=60)
GFR, EST NON AFRICAN AMERICAN: 97 mL/min/{1.73_m2} (ref >=60)
GLOBULIN: 2.4 g/dL (ref 1.9–3.7)
GLUCOSE: 82 mg/dL (ref 65–99)
Potassium: 4 mmol/L (ref 3.5–5.3)
SODIUM: 139 mmol/L (ref 135–146)
Total Bilirubin: 0.5 mg/dL (ref 0.2–1.2)
Total Protein: 6.3 g/dL (ref 6.1–8.1)

## 2017-06-10 ENCOUNTER — Other Ambulatory Visit: Payer: Self-pay | Admitting: Family Medicine

## 2017-06-18 ENCOUNTER — Encounter: Payer: Self-pay | Admitting: Family Medicine

## 2017-06-18 ENCOUNTER — Ambulatory Visit (INDEPENDENT_AMBULATORY_CARE_PROVIDER_SITE_OTHER): Payer: Medicare Other | Admitting: Family Medicine

## 2017-06-18 VITALS — BP 126/74 | HR 82 | Temp 98.9°F | Resp 14 | Wt 134.2 lb

## 2017-06-18 DIAGNOSIS — R05 Cough: Secondary | ICD-10-CM | POA: Diagnosis not present

## 2017-06-18 DIAGNOSIS — J3489 Other specified disorders of nose and nasal sinuses: Secondary | ICD-10-CM

## 2017-06-18 DIAGNOSIS — R058 Other specified cough: Secondary | ICD-10-CM

## 2017-06-18 MED ORDER — BENZONATATE 100 MG PO CAPS: 100.0000 mg | ORAL_CAPSULE | Freq: Two times a day (BID) | ORAL | 0 refills | Status: DC | PRN

## 2017-06-18 MED ORDER — FLUTICASONE PROPIONATE 50 MCG/ACT NA SUSP: 2.0000 | Freq: Every day | NASAL | 6 refills | Status: DC

## 2017-06-18 MED ORDER — AZITHROMYCIN 250 MG PO TABS: ORAL_TABLET | ORAL | 0 refills | Status: DC

## 2017-06-18 NOTE — Progress Notes (Signed)
Name: Zoe Alvarado   MRN: [id number]    DOB: 12/31/1945   Date:06/18/2017       Progress Note  Subjective  Chief Complaint  Chief Complaint  Patient presents with  . URI    coughing, sneezing, nasal drip, light itchy throat and tender to the touch, onset sunday. pt denies any fever, body aches/ chills.     Cough  This is a new problem. The current episode started in the past 7 days (5 days ago). The cough is productive of sputum. Associated symptoms include nasal congestion, postnasal drip and a sore throat. Pertinent negatives include no chills, fever (Tmax 98.75F only checked once.) or shortness of breath. She has tried OTC cough suppressant (OTC Claritin) for the symptoms. The treatment provided moderate relief.     Past Medical History:  Diagnosis Date  . Allergy   . Hyperlipidemia   . Hypertension   . Osteoporosis   . Prolapse of female pelvic organs     Past Surgical History:  Procedure Laterality Date  . ABDOMINAL HYSTERECTOMY    . CHOLECYSTECTOMY    . DENTAL SURGERY    . DILATION AND CURETTAGE OF UTERUS    . KNEE SURGERY    . TONSILLECTOMY      Family History  Problem Relation Age of Onset  . Breast cancer Maternal Aunt 40  . Breast cancer Maternal Grandmother   . Diabetes Mother   . Heart disease Father   . Hypertension Father   . Prostate cancer Father   . Diabetes Brother   . Ovarian cancer Neg Hx   . Colon cancer Neg Hx     Social History   Socioeconomic History  . Marital status: Married    Spouse name: Not on file  . Number of children: Not on file  . Years of education: Not on file  . Highest education level: Not on file  Social Needs  . Financial resource strain: Not on file  . Food insecurity - worry: Not on file  . Food insecurity - inability: Not on file  . Transportation needs - medical: Not on file  . Transportation needs - non-medical: Not on file  Occupational History  . Not on file  Tobacco Use  . Smoking status: Never Smoker  .  Smokeless tobacco: Never Used  Substance and Sexual Activity  . Alcohol use: No    Alcohol/week: 0.0 oz  . Drug use: No  . Sexual activity: Yes    Partners: Male    Birth control/protection: Surgical  Other Topics Concern  . Not on file  Social History Narrative  . Not on file     Current Outpatient Medications:  .  Calcium Carbonate-Vit D-Min (CALTRATE 600+D PLUS MINERALS) 600-800 MG-UNIT CHEW, Chew by mouth daily. , Disp: , Rfl:  .  Cholecalciferol (VITAMIN D) 2000 UNITS CAPS, Take 1 capsule by mouth daily., Disp: , Rfl:  .  docusate sodium (COLACE) 100 MG capsule, Take 1 capsule (100 mg total) by mouth 2 (two) times daily., Disp: 30 capsule, Rfl: 0 .  estradiol (ESTRACE) 0.5 MG tablet, TAKE ONE TABLET BY MOUTH ONCE DAILY, Disp: 90 tablet, Rfl: 4 .  metroNIDAZOLE (FLAGYL) 500 MG tablet, Take 500 mg by mouth 2 (two) times daily., Disp: , Rfl:  .  MULTIPLE VITAMINS-MINERALS PO, Take 1 tablet by mouth daily., Disp: , Rfl:  .  olmesartan (BENICAR) 20 MG tablet, TAKE 1 TABLET BY MOUTH ONCE DAILY, Disp: 90 tablet, Rfl: 0 .  omeprazole (PRILOSEC) 40 MG capsule, Take 1 capsule (40 mg total) by mouth daily., Disp: 30 capsule, Rfl: 0 .  conjugated estrogens (PREMARIN) vaginal cream, Place 1 Applicatorful vaginally daily. (Patient not taking: Reported on 06/18/2017), Disp: 42.5 g, Rfl: 12 .  diazepam (VALIUM) 5 MG tablet, Take 5 mg by mouth every 6 (six) hours as needed for anxiety., Disp: , Rfl:  .  traMADol (ULTRAM) 50 MG tablet, Take 50 mg by mouth every 6 (six) hours as needed., Disp: , Rfl:   Allergies  Allergen Reactions  . Codeine   . Penicillins      Review of Systems  Constitutional: Negative for chills and fever (Tmax 98.17F only checked once.).  HENT: Positive for postnasal drip and sore throat.   Respiratory: Positive for cough. Negative for shortness of breath.      Objective  Vitals:   06/18/17 1432  BP: 126/74  Pulse: 82  Resp: 14  Temp: 98.9 F (37.2 C)   TempSrc: Oral  SpO2: 96%  Weight: 134 lb 3.2 oz (60.9 kg)    Physical Exam  Constitutional: She is well-developed, well-nourished, and in no distress.  HENT:  Head: Normocephalic and atraumatic.  Right Ear: Tympanic membrane and ear canal normal.  Left Ear: Tympanic membrane and ear canal normal.  Nose: Right sinus exhibits maxillary sinus tenderness. Left sinus exhibits maxillary sinus tenderness.  Mouth/Throat: Oropharynx is clear and moist.  Ear canal cerumen impaction Nasal turbinate hypertrophy, mild mucosal inflammation  Cardiovascular: Normal rate, regular rhythm, S1 normal, S2 normal and normal heart sounds.  No murmur heard. Pulmonary/Chest: Effort normal and breath sounds normal. She has no wheezes.  Nursing note and vitals reviewed.    Assessment & Plan  1. Nasal drainage  - fluticasone (FLONASE) 50 MCG/ACT nasal spray; Place 2 sprays into both nostrils daily.  Dispense: 16 g; Refill: 6  2. Productive cough Is on conservative treatment, may take benzonatate for treatment of cough, if symptoms do not improve within the next 2-3 days, she may start the antibiotic - benzonatate (TESSALON) 100 MG capsule; Take 1 capsule (100 mg total) by mouth 2 (two) times daily as needed for cough.  Dispense: 20 capsule; Refill: 0 - azithromycin (ZITHROMAX) 250 MG tablet; 2 tabs po day 1, then 1 tab po q day x 4 days  Dispense: 6 tablet; Refill: 0   Tzipora Mcinroy Asad A. Faylene KurtzShah [facility] 06/18/2017 2:41 PM

## 2017-06-29 ENCOUNTER — Other Ambulatory Visit: Payer: Self-pay | Admitting: Family Medicine

## 2017-06-29 DIAGNOSIS — R058 Other specified cough: Secondary | ICD-10-CM

## 2017-06-29 DIAGNOSIS — R05 Cough: Secondary | ICD-10-CM

## 2017-09-21 ENCOUNTER — Other Ambulatory Visit: Payer: Self-pay

## 2017-09-21 MED ORDER — OLMESARTAN MEDOXOMIL 20 MG PO TABS: 20.0000 mg | ORAL_TABLET | Freq: Every day | ORAL | 2 refills | Status: DC

## 2017-09-21 NOTE — Telephone Encounter (Signed)
Last Cr and K+ reviewed; last visit, BP reviewed; Rx approved

## 2018-01-30 ENCOUNTER — Other Ambulatory Visit: Payer: Self-pay | Admitting: Obstetrics and Gynecology

## 2018-04-06 ENCOUNTER — Ambulatory Visit (INDEPENDENT_AMBULATORY_CARE_PROVIDER_SITE_OTHER): Payer: Medicare Other

## 2018-04-06 DIAGNOSIS — Z23 Encounter for immunization: Secondary | ICD-10-CM

## 2018-06-22 ENCOUNTER — Telehealth: Payer: Self-pay | Admitting: Family Medicine

## 2018-06-22 NOTE — Telephone Encounter (Signed)
Annye Asa is listed as PCP

## 2018-06-23 NOTE — Telephone Encounter (Signed)
Spoke with pt and she will call back once she gets home to schedule the appt. Pt informed that script has been sent to the pharmacy

## 2018-06-23 NOTE — Telephone Encounter (Signed)
Transfer from Dr. Sherryll Burger - has not been seen in over a year.  30-day supply provided, but she must come back in the next month for visit.

## 2018-06-24 ENCOUNTER — Ambulatory Visit (INDEPENDENT_AMBULATORY_CARE_PROVIDER_SITE_OTHER): Payer: Medicare Other | Admitting: Family Medicine

## 2018-06-24 ENCOUNTER — Ambulatory Visit
Admission: RE | Admit: 2018-06-24 | Discharge: 2018-06-24 | Disposition: A | Discharge status: Home / Self Care | Payer: Medicare Other | Attending: Family Medicine | Admitting: Family Medicine

## 2018-06-24 ENCOUNTER — Encounter: Payer: Self-pay | Admitting: Family Medicine

## 2018-06-24 ENCOUNTER — Ambulatory Visit
Admission: RE | Admit: 2018-06-24 | Discharge: 2018-06-24 | Disposition: A | Discharge status: Home / Self Care | Payer: Medicare Other | Source: Ambulatory Visit | Attending: Family Medicine | Admitting: Family Medicine

## 2018-06-24 VITALS — BP 114/62 | HR 97 | Temp 97.9°F | Resp 14 | Ht 59.0 in | Wt 142.8 lb

## 2018-06-24 DIAGNOSIS — S99922A Unspecified injury of left foot, initial encounter: Secondary | ICD-10-CM

## 2018-06-24 DIAGNOSIS — M810 Age-related osteoporosis without current pathological fracture: Secondary | ICD-10-CM

## 2018-06-24 DIAGNOSIS — I10 Essential (primary) hypertension: Secondary | ICD-10-CM

## 2018-06-24 DIAGNOSIS — E663 Overweight: Secondary | ICD-10-CM

## 2018-06-24 DIAGNOSIS — M1991 Primary osteoarthritis, unspecified site: Secondary | ICD-10-CM | POA: Diagnosis not present

## 2018-06-24 DIAGNOSIS — Z1239 Encounter for other screening for malignant neoplasm of breast: Secondary | ICD-10-CM

## 2018-06-24 DIAGNOSIS — E785 Hyperlipidemia, unspecified: Secondary | ICD-10-CM

## 2018-06-24 MED ORDER — OLMESARTAN MEDOXOMIL 20 MG PO TABS: 20.0000 mg | ORAL_TABLET | Freq: Every day | ORAL | 1 refills | Status: DC

## 2018-06-24 NOTE — Progress Notes (Signed)
Name: Zoe Alvarado   MRN: [id number]    DOB: 12-25-1945   Date:06/24/2018       Progress Note  Subjective  Chief Complaint  Chief Complaint  Patient presents with  . Toe Injury  . Blood Pressure Check    HPI  Pt presents for follow up.  Former pt of Dr. Sherryll Burger, and has not been seen in over a year.  LEFT 4th Toe Pain: She hit her toe on a desk yesterday.  She has had bruising, some swelling, and pain to the 4th toe since then.  The pain is a little better today, but she is concerned for fracture due to her history of osteoporosis.  Osteoporosis: Had occult ankle fx in the 90's, had a fall in the 2013 that resulted in LEFT shoulder fracture.  She was advised not to take fossamax by her dentist when she was obtaining dental implants. She is taking calcium & vitamin D supplement but not daily like she used to.  She does exercise at home and is going to complete fitness for women about 3 times a week. Her LEFT shoulder has been sore lately - wants to just watch this for right now, declines imaging. She is prescribed estadiol which is Rx'd by GYN for her osteoporosis.   OA: She was told she needs RIGHT knee replacement, but has been resisting this.  She is trying to strengthen through exercise.   Dyslipidemia: Due for labs today. Not taking statin therapy at this time. She denies chest pain or shortness of breath.  States has never needed medication, but has been "borderline" in the past.   Overweight:  She is an emotional eater, and has had some deaths in the family recently.  She had been using [employer] which is a support group style weight management program.  She is doing this online.  She was losing some weight, but got off track with the deaths in the family.  She is exercising 3 times a week.   HTN: Taking olmesartan. Denies headaches, chest pain, shortness of breath, BLE edema. Doing well on medication - at goal today.  Due for labs.  Patient Active Problem List   Diagnosis Date Noted  .  OP (osteoporosis) 01/15/2017  . Arthritis, degenerative 01/15/2017  . Climacteric 01/15/2017  . Obesity (BMI 30.0-34.9) 05/02/2015  . Hypertension 12/06/2014  . Dyslipidemia 12/06/2014    Past Surgical History:  Procedure Laterality Date  . ABDOMINAL HYSTERECTOMY    . CHOLECYSTECTOMY    . DENTAL SURGERY    . DILATION AND CURETTAGE OF UTERUS    . KNEE SURGERY    . TONSILLECTOMY      Family History  Problem Relation Age of Onset  . Breast cancer Maternal Aunt 40  . Breast cancer Maternal Grandmother   . Diabetes Mother   . Heart disease Father   . Hypertension Father   . Prostate cancer Father   . Diabetes Brother   . Ovarian cancer Neg Hx   . Colon cancer Neg Hx     Social History   Socioeconomic History  . Marital status: Married    Spouse name: Not on file  . Number of children: Not on file  . Years of education: Not on file  . Highest education level: Not on file  Occupational History  . Not on file  Social Needs  . Financial resource strain: Not on file  . Food insecurity:    Worry: Not on file  Inability: Not on file  . Transportation needs:    Medical: Not on file    Non-medical: Not on file  Tobacco Use  . Smoking status: Never Smoker  . Smokeless tobacco: Never Used  Substance and Sexual Activity  . Alcohol use: No    Alcohol/week: 0.0 standard drinks  . Drug use: No  . Sexual activity: Yes    Partners: Male    Birth control/protection: Surgical  Lifestyle  . Physical activity:    Days per week: Not on file    Minutes per session: Not on file  . Stress: Not on file  Relationships  . Social connections:    Talks on phone: Not on file    Gets together: Not on file    Attends religious service: Not on file    Active member of club or organization: Not on file    Attends meetings of clubs or organizations: Not on file    Relationship status: Not on file  . Intimate partner violence:    Fear of current or ex partner: Not on file     Emotionally abused: Not on file    Physically abused: Not on file    Forced sexual activity: Not on file  Other Topics Concern  . Not on file  Social History Narrative  . Not on file     Current Outpatient Medications:  .  Calcium Carbonate-Vit D-Min (CALTRATE 600+D PLUS MINERALS) 600-800 MG-UNIT CHEW, Chew by mouth daily. , Disp: , Rfl:  .  estradiol (ESTRACE) 0.5 MG tablet, TAKE 1 TABLET BY MOUTH ONCE DAILY, Disp: 90 tablet, Rfl: 4 .  fluticasone (FLONASE) 50 MCG/ACT nasal spray, Place 2 sprays into both nostrils daily., Disp: 16 g, Rfl: 6 .  MULTIPLE VITAMINS-MINERALS PO, Take 1 tablet by mouth daily., Disp: , Rfl:  .  olmesartan (BENICAR) 20 MG tablet, TAKE 1 TABLET BY MOUTH ONCE DAILY, Disp: 30 tablet, Rfl: 0  Allergies  Allergen Reactions  . Codeine   . Penicillins     I personally reviewed active problem list, medication list, allergies, health maintenance, notes from last encounter, lab results with the patient/caregiver today.   ROS Constitutional: Negative for fever or weight change.  Respiratory: Negative for cough and shortness of breath.   Cardiovascular: Negative for chest pain or palpitations.  Gastrointestinal: Negative for abdominal pain, no bowel changes.  Musculoskeletal: See HPI Skin: Negative for rash.  Neurological: Negative for dizziness or headache.  No other specific complaints in a complete review of systems (except as listed in HPI above).  Objective  Vitals:   06/24/18 1017  BP: 114/62  Pulse: 97  Resp: 14  Temp: 97.9 F (36.6 C)  TempSrc: Oral  SpO2: 97%  Weight: 142 lb 12.8 oz (64.8 kg)  Height: 4\' 11"  (1.499 m)   Body mass index is 28.84 kg/m.  Physical Exam Constitutional: Patient appears well-developed and well-nourished. No distress.  HENT: Head: Normocephalic and atraumatic.   Eyes: Conjunctivae and EOM are normal. No scleral icterus.  Pupils are equal, round, and reactive to light.  Neck: Normal range of motion. Neck supple.  No JVD present. No thyromegaly present.  Cardiovascular: Normal rate, regular rhythm and normal heart sounds.  No murmur heard. No BLE edema. Pulmonary/Chest: Effort normal and breath sounds normal. No respiratory distress. Musculoskeletal: Normal range of motion. LEFT 4th toe is ecchymotic with minimal edema, no bony tenderness present. Neurological: Pt is alert and oriented to person, place, and time. No cranial nerve deficit.  Coordination, balance, strength, speech and gait are normal.  Skin: Skin is warm and dry. No rash noted. No erythema.  Psychiatric: Patient has a normal mood and affect. behavior is normal. Judgment and thought content normal.  No results found for this or any previous visit (from the past 72 hour(s)).   PHQ2/9: Depression screen [facility] 2/[facility] 06/18/2017 04/24/2017 02/02/2017 12/10/2016 03/20/2016  Decreased Interest 0 0 0 0 0  Down, Depressed, Hopeless 0 0 0 0 0  PHQ - 2 Score 0 0 0 0 0   Fall Risk: Fall Risk  06/24/2018 06/18/2017 04/24/2017 02/02/2017 03/20/2016  Falls in the past year? 0 No No Yes Yes  Number falls in past yr: 0 - - 1 1  Injury with Fall? 0 - - No No  Follow up - - - Falls prevention discussed -   Functional Status Survey: Is the patient deaf or have difficulty hearing?: No Does the patient have difficulty seeing, even when wearing glasses/contacts?: No Does the patient have difficulty concentrating, remembering, or making decisions?: No Does the patient have difficulty walking or climbing stairs?: No Does the patient have difficulty dressing or bathing?: No Does the patient have difficulty doing errands alone such as visiting a doctor's office or shopping?: No  Assessment & Plan  1. Essential hypertension - DASH diet discussed - COMPLETE METABOLIC PANEL WITH GFR - olmesartan (BENICAR) 20 MG tablet; Take 1 tablet (20 mg total) by mouth daily.  Dispense: 90 tablet; Refill: 1 - TSH  2. Primary osteoarthritis, unspecified site - DG Foot Complete  Left; Future  3. Osteoporosis without current pathological fracture, unspecified osteoporosis type - PTH, Intact and Calcium - DG Bone Density; Future - TSH  4. Dyslipidemia - Lipid panel  5. Overweight (BMI 25.0-29.9) - Discussed importance of 150 minutes of physical activity weekly, eat two servings of fish weekly, eat one serving of tree nuts ( cashews, pistachios, pecans, almonds.Marland Kitchen.) every other day, eat 6 servings of fruit/vegetables daily and drink plenty of water and avoid sweet beverages.  - TSH  6. Injury of toe on left foot, initial encounter - DG Foot Complete Left; Future  7. Breast cancer screening - MM Digital Screening; Future

## 2018-06-24 NOTE — Patient Instructions (Signed)
-   [facility] [address] [facility] 1p-7p Mon-Fri

## 2018-06-25 ENCOUNTER — Ambulatory Visit: Payer: Medicare Other | Admitting: Family Medicine

## 2018-06-25 LAB — COMPLETE METABOLIC PANEL WITH GFR
AG Ratio: 1.4 (calc) (ref 1.0–2.5)
ALKALINE PHOSPHATASE (APISO): 90 U/L (ref 37–153)
ALT: 10 U/L (ref 6–29)
AST: 17 U/L (ref 10–35)
Albumin: 4 g/dL (ref 3.6–5.1)
BILIRUBIN TOTAL: 0.5 mg/dL (ref 0.2–1.2)
BUN / CREAT RATIO: 18 (calc) (ref 6–22)
BUN: 10 mg/dL (ref 7–25)
CHLORIDE: 105 mmol/L (ref 98–110)
CO2: 26 mmol/L (ref 20–32)
CREATININE: 0.55 mg/dL — AB (ref 0.60–0.93)
Calcium: 9.4 mg/dL (ref 8.6–10.4)
GFR, Est African American: 110 mL/min/{1.73_m2} (ref >=60)
GFR, Est Non African American: 95 mL/min/{1.73_m2} (ref >=60)
GLOBULIN: 2.8 g/dL (ref 1.9–3.7)
Glucose, Bld: 74 mg/dL (ref 65–99)
Potassium: 4.4 mmol/L (ref 3.5–5.3)
SODIUM: 140 mmol/L (ref 135–146)
Total Protein: 6.8 g/dL (ref 6.1–8.1)

## 2018-06-25 LAB — LIPID PANEL
CHOL/HDL RATIO: 3.2 (calc) (ref <5.0)
CHOLESTEROL: 195 mg/dL (ref <200)
HDL: 61 mg/dL (ref >=50)
LDL CHOLESTEROL (CALC): 109 mg/dL — AB
NON-HDL CHOLESTEROL (CALC): 134 mg/dL — AB (ref <130)
Triglycerides: 131 mg/dL (ref <150)

## 2018-06-25 LAB — PTH, INTACT AND CALCIUM
Calcium: 9.4 mg/dL (ref 8.6–10.4)
PTH: 27 pg/mL (ref 14–64)

## 2018-06-25 LAB — TSH: TSH: 1.07 m[IU]/L (ref 0.40–4.50)

## 2018-06-28 ENCOUNTER — Other Ambulatory Visit: Payer: Self-pay | Admitting: Family Medicine

## 2018-06-28 DIAGNOSIS — E7800 Pure hypercholesterolemia, unspecified: Secondary | ICD-10-CM

## 2018-06-28 MED ORDER — ATORVASTATIN CALCIUM 20 MG PO TABS: 20.0000 mg | ORAL_TABLET | Freq: Every day | ORAL | 3 refills | Status: DC

## 2018-06-29 ENCOUNTER — Encounter: Payer: Self-pay | Admitting: Family Medicine

## 2018-09-24 ENCOUNTER — Ambulatory Visit: Payer: Medicare Other | Admitting: Family Medicine

## 2018-12-31 ENCOUNTER — Other Ambulatory Visit: Payer: Self-pay | Admitting: Family Medicine

## 2018-12-31 DIAGNOSIS — Z1231 Encounter for screening mammogram for malignant neoplasm of breast: Secondary | ICD-10-CM

## 2019-01-17 ENCOUNTER — Other Ambulatory Visit: Payer: Self-pay | Admitting: Family Medicine

## 2019-01-17 DIAGNOSIS — I10 Essential (primary) hypertension: Secondary | ICD-10-CM

## 2019-01-17 NOTE — Telephone Encounter (Signed)
Requested medication (s) are due for refill today:yes  Requested medication (s) are on the active medication list: yes  Last refill:  10/17/2018  Future visit scheduled: no  Notes to clinic:  Review for refill   Requested Prescriptions  Pending Prescriptions Disp Refills   olmesartan (BENICAR) 20 MG tablet [Pharmacy Med Name: Olmesartan Medoxomil 20 MG Oral Tablet] 90 tablet 0    Sig: Take 1 tablet by mouth once daily     Cardiovascular:  Angiotensin Receptor Blockers Failed - 01/17/2019  6:28 AM      Failed - Cr in normal range and within 180 days    Creat  Date Value Ref Range Status  06/24/2018 0.55 (L) 0.60 - 0.93 mg/dL Final    Comment:    For patients >63 years of age, the reference limit for Creatinine is approximately 13% higher for people identified as African-American. .          Failed - K in normal range and within 180 days    Potassium  Date Value Ref Range Status  06/24/2018 4.4 3.5 - 5.3 mmol/L Final         Failed - Valid encounter within last 6 months    Recent Outpatient Visits          6 months ago Essential hypertension   [place], [place], FNP   1 year ago Nasal drainage   [place], [place]   1 year ago Total bilirubin, elevated   [place], MD   1 year ago Generalized abdominal pain   [place], [place]   2 years ago Essential hypertension   [place], [place]             Passed - Patient is not pregnant      Passed - Last BP in normal range    BP Readings from Last 1 Encounters:  06/24/18 114/62

## 2019-01-20 ENCOUNTER — Other Ambulatory Visit: Payer: Self-pay | Admitting: Emergency Medicine

## 2019-01-20 DIAGNOSIS — I10 Essential (primary) hypertension: Secondary | ICD-10-CM

## 2019-01-21 MED ORDER — OLMESARTAN MEDOXOMIL 20 MG PO TABS: 20.0000 mg | ORAL_TABLET | Freq: Every day | ORAL | 1 refills | Status: DC

## 2019-01-21 NOTE — Telephone Encounter (Signed)
Please schedule routine appointment within the next 4 months

## 2019-02-04 ENCOUNTER — Ambulatory Visit: Payer: Medicare Other

## 2019-02-04 ENCOUNTER — Other Ambulatory Visit: Payer: Medicare Other

## 2019-02-15 ENCOUNTER — Ambulatory Visit
Admission: RE | Admit: 2019-02-15 | Discharge: 2019-02-15 | Disposition: A | Discharge status: Home / Self Care | Payer: Medicare Other | Source: Ambulatory Visit | Attending: Family Medicine | Admitting: Family Medicine

## 2019-02-15 DIAGNOSIS — Z1231 Encounter for screening mammogram for malignant neoplasm of breast: Secondary | ICD-10-CM | POA: Diagnosis present

## 2019-02-16 ENCOUNTER — Ambulatory Visit
Admission: RE | Admit: 2019-02-16 | Discharge: 2019-02-16 | Disposition: A | Discharge status: Home / Self Care | Payer: Medicare Other | Source: Ambulatory Visit | Attending: Family Medicine | Admitting: Family Medicine

## 2019-02-16 DIAGNOSIS — M810 Age-related osteoporosis without current pathological fracture: Secondary | ICD-10-CM | POA: Diagnosis not present

## 2019-02-18 ENCOUNTER — Other Ambulatory Visit: Payer: Self-pay | Admitting: Family Medicine

## 2019-02-18 DIAGNOSIS — M810 Age-related osteoporosis without current pathological fracture: Secondary | ICD-10-CM

## 2019-02-22 ENCOUNTER — Encounter: Payer: Self-pay | Admitting: Family Medicine

## 2019-02-25 ENCOUNTER — Other Ambulatory Visit: Payer: Self-pay

## 2019-02-25 ENCOUNTER — Ambulatory Visit (INDEPENDENT_AMBULATORY_CARE_PROVIDER_SITE_OTHER): Payer: Medicare Other

## 2019-02-25 DIAGNOSIS — Z23 Encounter for immunization: Secondary | ICD-10-CM | POA: Diagnosis not present

## 2019-04-18 ENCOUNTER — Other Ambulatory Visit: Payer: Self-pay

## 2019-04-26 ENCOUNTER — Other Ambulatory Visit: Payer: Self-pay

## 2019-04-26 MED ORDER — ESTRADIOL 0.5 MG PO TABS: 0.5000 mg | ORAL_TABLET | Freq: Every day | ORAL | 0 refills | Status: DC

## 2019-04-28 ENCOUNTER — Other Ambulatory Visit: Payer: Self-pay

## 2019-04-28 ENCOUNTER — Encounter: Payer: Self-pay | Admitting: Family Medicine

## 2019-04-28 ENCOUNTER — Ambulatory Visit: Payer: Medicare Other

## 2019-04-28 ENCOUNTER — Ambulatory Visit (INDEPENDENT_AMBULATORY_CARE_PROVIDER_SITE_OTHER): Payer: Medicare Other | Admitting: Family Medicine

## 2019-04-28 VITALS — BP 122/66 | HR 72

## 2019-04-28 DIAGNOSIS — E669 Obesity, unspecified: Secondary | ICD-10-CM

## 2019-04-28 DIAGNOSIS — E785 Hyperlipidemia, unspecified: Secondary | ICD-10-CM | POA: Diagnosis not present

## 2019-04-28 DIAGNOSIS — M1991 Primary osteoarthritis, unspecified site: Secondary | ICD-10-CM

## 2019-04-28 DIAGNOSIS — I10 Essential (primary) hypertension: Secondary | ICD-10-CM

## 2019-04-28 DIAGNOSIS — M810 Age-related osteoporosis without current pathological fracture: Secondary | ICD-10-CM | POA: Diagnosis not present

## 2019-04-28 MED ORDER — OLMESARTAN MEDOXOMIL 20 MG PO TABS: 20.0000 mg | ORAL_TABLET | Freq: Every day | ORAL | 1 refills | Status: DC

## 2019-04-28 NOTE — Progress Notes (Signed)
Name: Zoe Alvarado   MRN: [id number]    DOB: [birth year]   Date:04/28/2019       Progress Note  Subjective  Chief Complaint  Chief Complaint  Patient presents with  . Follow-up  . Hypertension  . Hyperlipidemia    I connected with  Zoe Alvarado on 04/28/19 at  9:20 AM EST by telephone and verified that I am speaking with the correct person using two identifiers.  I discussed the limitations, risks, security and privacy concerns of performing an evaluation and management service by telephone and the availability of in person appointments. Staff also discussed with the patient that there may be a patient responsible charge related to this service. Patient Location: Home Provider Location: Office Additional Individuals present: None  HPI  Pt presents for follow up:  Osteoporosis: Had occult ankle fx in the 90's, had a fall in the 2013 that resulted in LEFT shoulder fracture.  She was referred to Dr. Tedd SiasSolum who recommended Fossamax, however pt has dental implants and is going to speak with her dentist prior to starting the medication.  She is taking OTC chews of calcium & vitamin D supplement.   She does exercise at home and is going to [employer] [facility] [employer] about 3 times a week.  She is prescribed estradiol which is Rx'd by GYN for her osteoporosis.   OA: She was told she needs RIGHT knee replacement, but has been resisting this for as long as possible - states her pain is daily, though exercise does help.  She is also having pain in bilateral thumbs - went to see [facility] and was told she has arthritis. She was told by [facility] that she can follow up with the knee specialist as well.   Dyslipidemia: Due for labs today. She has not been taking her atorvastatin because she wanted to focus on diet and exercise. She denies chest pain or shortness of breath. The 10-year ASCVD risk score Denman George(Goff DC Montez HagemanJr., et al., 2013) is: 12.5%   Values used to calculate the score:     Age: [age]  years     Sex: Female     Is Non-Hispanic African American: No     Diabetic: No     Tobacco smoker: No     Systolic Blood Pressure: 122 mmHg     Is BP treated: Yes     HDL Cholesterol: 61 mg/dL     Total Cholesterol: 195 mg/dL  Obesity:  She is an emotional eater, and has had some deaths in the family recently.  She has been using [facility] which is a support group style weight management program - doing virtual visits right now.  She was losing some weight, but got off track with the deaths/emergencies in the family.  She is exercising 3 times a week.  Weight has been about 142lbs.  HTN: Taking olmesartan 20mg ; checking home BP on occasion and appears to be normal.  However she has noticed an elevated heart rate around 105 when stressed; otherwise in the 60-70's range.  Denies headaches, chest pain, shortness of breath, BLE edema, palpitations. Due for labs today.    BP Readings from Last 3 Encounters:  04/28/19 122/66  06/24/18 114/62  06/18/17 126/74    Patient Active Problem List   Diagnosis Date Noted  . OP (osteoporosis) 01/15/2017  . Arthritis, degenerative 01/15/2017  . Climacteric 01/15/2017  . Obesity (BMI 30.0-34.9) 05/02/2015  . Hypertension 12/06/2014  . Dyslipidemia 12/06/2014  Past Surgical History:  Procedure Laterality Date  . ABDOMINAL HYSTERECTOMY    . CHOLECYSTECTOMY    . DENTAL SURGERY    . DILATION AND CURETTAGE OF UTERUS    . KNEE SURGERY    . TONSILLECTOMY      Family History  Problem Relation Age of Onset  . Breast cancer Maternal Aunt 40  . Breast cancer Maternal Grandmother   . Diabetes Mother   . Heart disease Father   . Hypertension Father   . Prostate cancer Father   . Diabetes Brother   . Ovarian cancer Neg Hx   . Colon cancer Neg Hx     Social History   Socioeconomic History  . Marital status: Married    Spouse name: Not on file  . Number of children: Not on file  . Years of education: Not on file  . Highest education level:  Not on file  Occupational History  . Not on file  Tobacco Use  . Smoking status: Never Smoker  . Smokeless tobacco: Never Used  Substance and Sexual Activity  . Alcohol use: No    Alcohol/week: 0.0 standard drinks  . Drug use: No  . Sexual activity: Yes    Partners: Male    Birth control/protection: Surgical  Other Topics Concern  . Not on file  Social History Narrative  . Not on file   Social Determinants of Health   Financial Resource Strain:   . Difficulty of Paying Living Expenses: Not on file  Food Insecurity:   . Worried About Programme researcher, broadcasting/film/video in the Last Year: Not on file  . Ran Out of Food in the Last Year: Not on file  Transportation Needs:   . Lack of Transportation (Medical): Not on file  . Lack of Transportation (Non-Medical): Not on file  Physical Activity:   . Days of Exercise per Week: Not on file  . Minutes of Exercise per Session: Not on file  Stress:   . Feeling of Stress : Not on file  Social Connections: Unknown  . Frequency of Communication with Friends and Family: Not asked  . Frequency of Social Gatherings with Friends and Family: Not asked  . Attends Religious Services: Not asked  . Active Member of Clubs or Organizations: Not asked  . Attends Banker Meetings: Not on file  . Marital Status: Not on file  Intimate Partner Violence:   . Fear of Current or Ex-Partner: Not on file  . Emotionally Abused: Not on file  . Physically Abused: Not on file  . Sexually Abused: Not on file     Current Outpatient Medications:  .  Calcium Carbonate-Vit D-Min (CALTRATE 600+D PLUS MINERALS) 600-800 MG-UNIT CHEW, Chew by mouth daily. , Disp: , Rfl:  .  estradiol (ESTRACE) 0.5 MG tablet, Take 1 tablet (0.5 mg total) by mouth daily., Disp: 90 tablet, Rfl: 0 .  MULTIPLE VITAMINS-MINERALS PO, Take 1 tablet by mouth daily., Disp: , Rfl:  .  olmesartan (BENICAR) 20 MG tablet, Take 1 tablet (20 mg total) by mouth daily., Disp: 90 tablet, Rfl: 1 .   alendronate (FOSAMAX) 70 MG tablet, Take by mouth., Disp: , Rfl:  .  atorvastatin (LIPITOR) 20 MG tablet, Take 1 tablet (20 mg total) by mouth daily., Disp: 90 tablet, Rfl: 3 .  fluticasone (FLONASE) 50 MCG/ACT nasal spray, Place 2 sprays into both nostrils daily., Disp: 16 g, Rfl: 6  Allergies  Allergen Reactions  . Codeine   . Penicillins  I personally reviewed active problem list, medication list, allergies, notes from last encounter, lab results with the patient/caregiver today.   ROS  Constitutional: Negative for fever or weight change.  Respiratory: Negative for cough and shortness of breath.   Cardiovascular: Negative for chest pain or palpitations.  Gastrointestinal: Negative for abdominal pain, no bowel changes.  Musculoskeletal: Negative for gait problem or joint swelling.  Skin: Negative for rash.  Neurological: Negative for dizziness or headache.  No other specific complaints in a complete review of systems (except as listed in HPI above).  Objective  Virtual encounter, vitals not obtained.  There is no height or weight on file to calculate BMI.  Physical Exam  Pulmonary/Chest: Effort normal. No respiratory distress. Speaking in complete sentences Neurological: Pt is alert and oriented to person, place, and time. Speech is normal Psychiatric: Patient has a normal mood and affect. behavior is normal. Judgment and thought content normal.  No results found for this or any previous visit (from the past 72 hour(s)).  PHQ2/9: Depression screen [facility] 2/9 04/28/2019 06/18/2017 04/24/2017 02/02/2017 12/10/2016  Decreased Interest 0 0 0 0 0  Down, Depressed, Hopeless 0 0 0 0 0  PHQ - 2 Score 0 0 0 0 0  Altered sleeping 0 - - - -  Tired, decreased energy 0 - - - -  Change in appetite 0 - - - -  Feeling bad or failure about yourself  0 - - - -  Trouble concentrating 0 - - - -  Moving slowly or fidgety/restless 0 - - - -  Suicidal thoughts 0 - - - -  PHQ-9 Score 0 - - -  -  Difficult doing work/chores Not difficult at all - - - -   PHQ-2/9 Result is negative.    Fall Risk: Fall Risk  04/28/2019 06/24/2018 06/18/2017 04/24/2017 02/02/2017  Falls in the past year? 0 0 No No Yes  Number falls in past yr: 0 0 - - 1  Injury with Fall? 0 0 - - No  Follow up Falls evaluation completed - - - Falls prevention discussed    Assessment & Plan  1. Primary osteoarthritis, unspecified site - Does not want to return at this time.  2. Essential hypertension - olmesartan (BENICAR) 20 MG tablet; Take 1 tablet (20 mg total) by mouth daily.  Dispense: 90 tablet; Refill: 1 - COMPLETE METABOLIC PANEL WITH GFR  3. Dyslipidemia - Will take Atorvastatin 20mg  depending on labs - Lipid panel  4. Osteoporosis without current pathological fracture, unspecified osteoporosis type - Seeing Dr. Gabriel Carina - taking supplementation, seeing dentist prior to starting fossamax.  5. Obesity (BMI 30.0-34.9) - COMPLETE METABOLIC PANEL WITH GFR - Discussed importance of 150 minutes of physical activity weekly, eat two servings of fish weekly, eat one serving of tree nuts ( cashews, pistachios, pecans, almonds.Marland Kitchen) every other day, eat 6 servings of fruit/vegetables daily and drink plenty of water and avoid sweet beverages.   I discussed the assessment and treatment plan with the patient. The patient was provided an opportunity to ask questions and all were answered. The patient agreed with the plan and demonstrated an understanding of the instructions.   The patient was advised to call back or seek an in-person evaluation if the symptoms worsen or if the condition fails to improve as anticipated.  I provided 18 minutes of non-face-to-face time during this encounter.  Hubbard Hartshorn, FNP

## 2019-05-02 ENCOUNTER — Other Ambulatory Visit: Payer: Self-pay

## 2019-05-02 ENCOUNTER — Ambulatory Visit (INDEPENDENT_AMBULATORY_CARE_PROVIDER_SITE_OTHER): Payer: Medicare Other | Admitting: Emergency Medicine

## 2019-05-02 DIAGNOSIS — Z23 Encounter for immunization: Secondary | ICD-10-CM | POA: Diagnosis not present

## 2019-05-03 LAB — LIPID PANEL
Cholesterol: 165 mg/dL (ref <200)
HDL: 61 mg/dL (ref >=50)
LDL Cholesterol (Calc): 85 mg/dL (calc)
Non-HDL Cholesterol (Calc): 104 mg/dL (calc) (ref <130)
Total CHOL/HDL Ratio: 2.7 (calc) (ref <5.0)
Triglycerides: 99 mg/dL (ref <150)

## 2019-05-03 LAB — COMPLETE METABOLIC PANEL WITH GFR
AG Ratio: 1.4 (calc) (ref 1.0–2.5)
ALT: 11 U/L (ref 6–29)
AST: 18 U/L (ref 10–35)
Albumin: 3.9 g/dL (ref 3.6–5.1)
Alkaline phosphatase (APISO): 89 U/L (ref 37–153)
BUN/Creatinine Ratio: 11 (calc) (ref 6–22)
BUN: 6 mg/dL — ABNORMAL LOW (ref 7–25)
CO2: 26 mmol/L (ref 20–32)
Calcium: 9 mg/dL (ref 8.6–10.4)
Chloride: 104 mmol/L (ref 98–110)
Creat: 0.56 mg/dL — ABNORMAL LOW (ref 0.60–0.93)
GFR, Est African American: 109 mL/min/{1.73_m2} (ref >=60)
GFR, Est Non African American: 94 mL/min/{1.73_m2} (ref >=60)
Globulin: 2.7 g/dL (calc) (ref 1.9–3.7)
Glucose, Bld: 83 mg/dL (ref 65–99)
Potassium: 3.8 mmol/L (ref 3.5–5.3)
Sodium: 138 mmol/L (ref 135–146)
Total Bilirubin: 0.9 mg/dL (ref 0.2–1.2)
Total Protein: 6.6 g/dL (ref 6.1–8.1)

## 2019-05-05 ENCOUNTER — Other Ambulatory Visit: Payer: Self-pay

## 2019-05-05 ENCOUNTER — Encounter: Payer: Self-pay | Admitting: Obstetrics and Gynecology

## 2019-05-05 ENCOUNTER — Ambulatory Visit (INDEPENDENT_AMBULATORY_CARE_PROVIDER_SITE_OTHER): Payer: Medicare Other | Admitting: Obstetrics and Gynecology

## 2019-05-05 VITALS — BP 123/74 | HR 77 | Ht 59.0 in | Wt 142.2 lb

## 2019-05-05 DIAGNOSIS — M810 Age-related osteoporosis without current pathological fracture: Secondary | ICD-10-CM | POA: Diagnosis not present

## 2019-05-05 DIAGNOSIS — N8110 Cystocele, unspecified: Secondary | ICD-10-CM | POA: Insufficient documentation

## 2019-05-05 DIAGNOSIS — Z760 Encounter for issue of repeat prescription: Secondary | ICD-10-CM | POA: Diagnosis not present

## 2019-05-05 DIAGNOSIS — Z79890 Hormone replacement therapy (postmenopausal): Secondary | ICD-10-CM | POA: Diagnosis not present

## 2019-05-05 DIAGNOSIS — N816 Rectocele: Secondary | ICD-10-CM

## 2019-05-05 MED ORDER — ESTRADIOL 0.5 MG PO TABS: 0.5000 mg | ORAL_TABLET | Freq: Every day | ORAL | 3 refills | Status: DC

## 2019-05-05 NOTE — Progress Notes (Signed)
Pt present for medication management. Pt requesting a refill of Estrace 0.5mg .

## 2019-05-05 NOTE — Progress Notes (Signed)
GYNECOLOGY PROGRESS NOTE  Subjective:    Patient ID: Zoe Alvarado, female    DOB: August 20, 1945, 73 y.o.   MRN: [id number]  HPI  Patient is a 73 y.o. [id number] female who presents for medication refill.  She is currently taking Estrace. She uses the medication moreso for helping to manage her osteoporosis.  She has been on the medication for ~ 2-3 years. She was last seen at [facility] in 2018 by Dr. Enzo Bi.  Feels that she is doing well on the medication as it also helps her other "menopausal problems".  Is also taking Vitamin D and Calcium.  Notes that her PCP would like to start her on Fosamax as her osteoporosis has appeared to worsen. States that she is hesitant about this due to history of dental work (notes she was cautioned by her dentist on taking her medication.   Of note, patient also feels that her pelvic organ prolapse is worse.  She is s/p hysterectomy, currently has a cystocele with rectocele.  Feels more vaginal pressure. Is concerned that her husband may also be feeling the change but just has not said anything.    The following portions of the patient's history were reviewed and updated as appropriate:  She  has a past medical history of Acute non-recurrent maxillary sinusitis (03/20/2016), Allergy, Arthritis, Hyperlipidemia, Hypertension, Osteoporosis, and Prolapse of female pelvic organs.   She  has a past surgical history that includes Abdominal hysterectomy; Cholecystectomy; Dilation and curettage of uterus; Tonsillectomy; Knee surgery; and Dental surgery.   Her family history includes Breast cancer in her maternal grandmother; Breast cancer (age of onset: 62) in her maternal aunt; Diabetes in her brother and mother; Heart disease in her father; Hypertension in her father; Prostate cancer in her father.   She  reports that she has never smoked. She has never used smokeless tobacco. She reports that she does not drink alcohol or use drugs.   Current Outpatient Medications  on File Prior to Visit  Medication Sig Dispense Refill  . Calcium Carbonate-Vit D-Min (CALTRATE 600+D PLUS MINERALS) 600-800 MG-UNIT CHEW Chew by mouth daily.     . MULTIPLE VITAMINS-MINERALS PO Take 1 tablet by mouth daily.    Marland Kitchen olmesartan (BENICAR) 20 MG tablet Take 1 tablet (20 mg total) by mouth daily. 90 tablet 1  . alendronate (FOSAMAX) 70 MG tablet Take by mouth.     No current facility-administered medications on file prior to visit.   She is allergic to codeine and penicillins..  Review of Systems Pertinent items noted in HPI and remainder of comprehensive ROS otherwise negative.   Objective:   Blood pressure 123/74, pulse 77, height 4\' 11"  (1.499 m), weight 142 lb 3.2 oz (64.5 kg). General appearance: alert and no distress Remainder of exam deferred.       Assessment:   Medication refill Osteoporosis Menopausal on HRT Cystocele with rectocele  Plan:   1. This woman has a 2-3 year history of HRT exposure with new study data showing increased risk of thrombo-embolic events such as myocardial infarction stroke and breast cancer after 4 or more years exposure to combination products with estrogen and progesterone. She understands the benefits as well as the risks discussed above. She  is advised that she may wish to discontinue the HRT at any time, and encouraged to discuss this with her other physicians also. Her personal risk factors have been reviewed carefully with her today.  Patient ok to continue HRT.  Will refill Estrace.  2. Osteoporosis currently being managed by PCP. Advised to continue her Calcium and Vitamin D, continuing on Estrace, and plans to discuss with dentist again before initiation of Fosamax.  3. Patient with cystocele and rectocele. Inquires about options, notes she has been told about surgery in the past but has been hesitant. Briefly discussed surgery again vs pessary use.  Desires to be evaluated at another appointment. Patient also desires to have a  wellness exam, notes issues with insurance coverage in the past (Has Medicare).  Will schedule in the next 1-2 months.     A total of 20 minutes were spent face-to-face with the patient during this encounter and over half of that time dealt with counseling and coordination of care.    Hildred Laser, MD [facility]

## 2019-05-17 ENCOUNTER — Other Ambulatory Visit: Payer: Self-pay

## 2019-05-17 ENCOUNTER — Ambulatory Visit (INDEPENDENT_AMBULATORY_CARE_PROVIDER_SITE_OTHER): Payer: Medicare Other

## 2019-05-17 VITALS — BP 112/67 | HR 67 | Temp 98.0°F | Ht 59.0 in | Wt 142.0 lb

## 2019-05-17 DIAGNOSIS — Z0000 Encounter for general adult medical examination without abnormal findings: Secondary | ICD-10-CM | POA: Diagnosis not present

## 2019-05-17 NOTE — Patient Instructions (Signed)
Zoe Alvarado , Thank you for taking time to come for your Medicare Wellness Visit. I appreciate your ongoing commitment to your health goals. Please review the following plan we discussed and let me know if I can assist you in the future.   Screening recommendations/referrals: Colonoscopy: done 02/20/13. Repeat in 2024. Mammogram: done 02/15/19 Bone Density: done 02/16/19 Recommended yearly ophthalmology/optometry visit for glaucoma screening and checkup Recommended yearly dental visit for hygiene and checkup  Vaccinations: Influenza vaccine: done 02/25/19 Pneumococcal vaccine: done 05/02/19 Tdap vaccine: done 04/22/13 Shingles vaccine: Shingrix discussed. Please contact your pharmacy for coverage information.   Advanced directives: Please bring a copy of your health care power of attorney and living will to the office at your convenience.  Conditions/risks identified: Recommend continuing healthy eating and physical activity for desired weight loss.   Next appointment: Please follow up in one year for your Medicare Annual Wellness visit.     Preventive Care 61 Years and Older, Female Preventive care refers to lifestyle choices and visits with your health care provider that can promote health and wellness. What does preventive care include?  A yearly physical exam. This is also called an annual well check.  Dental exams once or twice a year.  Routine eye exams. Ask your health care provider how often you should have your eyes checked.  Personal lifestyle choices, including:  Daily care of your teeth and gums.  Regular physical activity.  Eating a healthy diet.  Avoiding tobacco and drug use.  Limiting alcohol use.  Practicing safe sex.  Taking low-dose aspirin every day.  Taking vitamin and mineral supplements as recommended by your health care provider. What happens during an annual well check? The services and screenings done by your health care provider during your  annual well check will depend on your age, overall health, lifestyle risk factors, and family history of disease. Counseling  Your health care provider may ask you questions about your:  Alcohol use.  Tobacco use.  Drug use.  Emotional well-being.  Home and relationship well-being.  Sexual activity.  Eating habits.  History of falls.  Memory and ability to understand (cognition).  Work and work Statistician.  Reproductive health. Screening  You may have the following tests or measurements:  Height, weight, and BMI.  Blood pressure.  Lipid and cholesterol levels. These may be checked every 5 years, or more frequently if you are over 15 years old.  Skin check.  Lung cancer screening. You may have this screening every year starting at age 25 if you have a 30-pack-year history of smoking and currently smoke or have quit within the past 15 years.  Fecal occult blood test (FOBT) of the stool. You may have this test every year starting at age 11.  Flexible sigmoidoscopy or colonoscopy. You may have a sigmoidoscopy every 5 years or a colonoscopy every 10 years starting at age 33.  Hepatitis C blood test.  Hepatitis B blood test.  Sexually transmitted disease (STD) testing.  Diabetes screening. This is done by checking your blood sugar (glucose) after you have not eaten for a while (fasting). You may have this done every 1-3 years.  Bone density scan. This is done to screen for osteoporosis. You may have this done starting at age 9.  Mammogram. This may be done every 1-2 years. Talk to your health care provider about how often you should have regular mammograms. Talk with your health care provider about your test results, treatment options, and if necessary, the  need for more tests. Vaccines  Your health care provider may recommend certain vaccines, such as:  Influenza vaccine. This is recommended every year.  Tetanus, diphtheria, and acellular pertussis (Tdap, Td)  vaccine. You may need a Td booster every 10 years.  Zoster vaccine. You may need this after age 7.  Pneumococcal 13-valent conjugate (PCV13) vaccine. One dose is recommended after age 27.  Pneumococcal polysaccharide (PPSV23) vaccine. One dose is recommended after age 90. Talk to your health care provider about which screenings and vaccines you need and how often you need them. This information is not intended to replace advice given to you by your health care provider. Make sure you discuss any questions you have with your health care provider. Document Released: 06/01/2015 Document Revised: 01/23/2016 Document Reviewed: 03/06/2015 Elsevier Interactive Patient Education  2017 [employer] Prevention in the Home Falls can cause injuries. They can happen to people of all ages. There are many things you can do to make your home safe and to help prevent falls. What can I do on the outside of my home?  Regularly fix the edges of walkways and driveways and fix any cracks.  Remove anything that might make you trip as you walk through a door, such as a raised step or threshold.  Trim any bushes or trees on the path to your home.  Use bright outdoor lighting.  Clear any walking paths of anything that might make someone trip, such as rocks or tools.  Regularly check to see if handrails are loose or broken. Make sure that both sides of any steps have handrails.  Any raised decks and porches should have guardrails on the edges.  Have any leaves, snow, or ice cleared regularly.  Use sand or salt on walking paths during winter.  Clean up any spills in your garage right away. This includes oil or grease spills. What can I do in the bathroom?  Use night lights.  Install grab bars by the toilet and in the tub and shower. Do not use towel bars as grab bars.  Use non-skid mats or decals in the tub or shower.  If you need to sit down in the shower, use a plastic, non-slip  stool.  Keep the floor dry. Clean up any water that spills on the floor as soon as it happens.  Remove soap buildup in the tub or shower regularly.  Attach bath mats securely with double-sided non-slip rug tape.  Do not have throw rugs and other things on the floor that can make you trip. What can I do in the bedroom?  Use night lights.  Make sure that you have a light by your bed that is easy to reach.  Do not use any sheets or blankets that are too big for your bed. They should not hang down onto the floor.  Have a firm chair that has side arms. You can use this for support while you get dressed.  Do not have throw rugs and other things on the floor that can make you trip. What can I do in the kitchen?  Clean up any spills right away.  Avoid walking on wet floors.  Keep items that you use a lot in easy-to-reach places.  If you need to reach something above you, use a strong step stool that has a grab bar.  Keep electrical cords out of the way.  Do not use floor polish or wax that makes floors slippery. If you must use wax,  use non-skid floor wax.  Do not have throw rugs and other things on the floor that can make you trip. What can I do with my stairs?  Do not leave any items on the stairs.  Make sure that there are handrails on both sides of the stairs and use them. Fix handrails that are broken or loose. Make sure that handrails are as long as the stairways.  Check any carpeting to make sure that it is firmly attached to the stairs. Fix any carpet that is loose or worn.  Avoid having throw rugs at the top or bottom of the stairs. If you do have throw rugs, attach them to the floor with carpet tape.  Make sure that you have a light switch at the top of the stairs and the bottom of the stairs. If you do not have them, ask someone to add them for you. What else can I do to help prevent falls?  Wear shoes that:  Do not have high heels.  Have rubber bottoms.  Are  comfortable and fit you well.  Are closed at the toe. Do not wear sandals.  If you use a stepladder:  Make sure that it is fully opened. Do not climb a closed stepladder.  Make sure that both sides of the stepladder are locked into place.  Ask someone to hold it for you, if possible.  Clearly mark and make sure that you can see:  Any grab bars or handrails.  First and last steps.  Where the edge of each step is.  Use tools that help you move around (mobility aids) if they are needed. These include:  Canes.  Walkers.  Scooters.  Crutches.  Turn on the lights when you go into a dark area. Replace any light bulbs as soon as they burn out.  Set up your furniture so you have a clear path. Avoid moving your furniture around.  If any of your floors are uneven, fix them.  If there are any pets around you, be aware of where they are.  Review your medicines with your doctor. Some medicines can make you feel dizzy. This can increase your chance of falling. Ask your doctor what other things that you can do to help prevent falls. This information is not intended to replace advice given to you by your health care provider. Make sure you discuss any questions you have with your health care provider. Document Released: 03/01/2009 Document Revised: 10/11/2015 Document Reviewed: 06/09/2014 Elsevier Interactive Patient Education  2017 [employer].

## 2019-05-17 NOTE — Progress Notes (Addendum)
Subjective:   Zoe Alvarado is a 73 y.o. female who presents for Medicare Annual (Subsequent) preventive examination.  Virtual Visit via Telephone Note  I connected with Zoe Alvarado on 05/17/19 at  8:40 AM EST by telephone and verified that I am speaking with the correct person using two identifiers.  Medicare Annual Wellness visit completed telephonically due to Covid-19 pandemic.   Location: Patient: home Provider: office   I discussed the limitations, risks, security and privacy concerns of performing an evaluation and management service by telephone and the availability of in person appointments. The patient expressed understanding and agreed to proceed.  Some vital signs may be absent or patient reported.   Clemetine Marker, LPN    Review of Systems:   Cardiac Risk Factors include: advanced age (>69men, >39 women);hypertension     Objective:     Vitals: BP 112/67   Pulse 67   Temp 98 F (36.7 C)   Ht 4\' 11"  (1.499 m)   Wt 142 lb (64.4 kg)   BMI 28.68 kg/m   Body mass index is 28.68 kg/m.  Advanced Directives 05/17/2019 02/02/2017 12/10/2016 03/20/2016 07/31/2015 05/02/2015 03/13/2015  Does Patient Have a Medical Advance Directive? Yes Yes No Yes Yes No No  Type of Paramedic of [place];Living will [place];Living will - - [place];Living will - -  Does patient want to make changes to medical advance directive? - - - - No - Patient declined - -  Copy of [place] in Chart? No - copy requested No - copy requested - - No - copy requested - -  Would patient like information on creating a medical advance directive? - - - - No - patient declined information No - patient declined information -    Tobacco Social History   Tobacco Use  Smoking Status Never Smoker  Smokeless Tobacco Never Used     Counseling given: Not Answered   Clinical Intake:  Pre-visit preparation completed:  Yes  Pain : 0-10 Pain Score: 5  Pain Type: Chronic pain(arthritis) Pain Location: Knee Pain Orientation: Right Pain Descriptors / Indicators: Aching, Sore Pain Onset: More than a month ago Pain Frequency: Constant     BMI - recorded: 28.68 Nutritional Status: BMI 25 -29 Overweight Nutritional Risks: None Diabetes: No  How often do you need to have someone help you when you read instructions, pamphlets, or other written materials from your doctor or pharmacy?: 1 - Never  Interpreter Needed?: No  Information entered by :: Clemetine Marker LPN  Past Medical History:  Diagnosis Date  . Acute non-recurrent maxillary sinusitis 03/20/2016  . Allergy   . 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 Cataract years  . Hyperlipidemia   . Hypertension   . Osteoporosis   . Prolapse of female pelvic organs    Past Surgical History:  Procedure Laterality Date  . ABDOMINAL HYSTERECTOMY    . CHOLECYSTECTOMY    . DENTAL SURGERY    . DILATION AND CURETTAGE OF UTERUS    . KNEE SURGERY    . TONSILLECTOMY     Family History  Problem Relation Age of Onset  . Breast cancer Maternal Aunt 41  . Breast cancer Maternal Grandmother   . Cancer Maternal Grandmother   . Diabetes Mother   . Heart disease Father   . Hypertension Father   . Prostate cancer Father   . Cancer Father   .  Vision loss Father   . Diabetes Brother   . Cancer Maternal Aunt   . Ovarian cancer Neg Hx   . Colon cancer Neg Hx    Social History   Socioeconomic History  . Marital status: Married    Spouse name: Not on file  . Number of children: 3  . Years of education: Not on file  . Highest education level: Not on file  Occupational History  . Not on file  Tobacco Use  . Smoking status: Never Smoker  . Smokeless tobacco: Never Used  Substance and Sexual Activity  . Alcohol use: No    Alcohol/week: 0.0 standard drinks  . Drug use: No  . Sexual activity: Yes    Partners: Male    Birth control/protection: Surgical   Other Topics Concern  . Not on file  Social History Narrative   2nd marriage   3 living sons, 2 deceased   Social Determinants of Health   Financial Resource Strain:   . Difficulty of Paying Living Expenses: Not on file  Food Insecurity:   . Worried About Programme researcher, broadcasting/film/video in the Last Year: Not on file  . Ran Out of Food in the Last Year: Not on file  Transportation Needs:   . Lack of Transportation (Medical): Not on file  . Lack of Transportation (Non-Medical): Not on file  Physical Activity:   . Days of Exercise per Week: Not on file  . Minutes of Exercise per Session: Not on file  Stress:   . Feeling of Stress : Not on file  Social Connections: Unknown  . Frequency of Communication with Friends and Family: Not asked  . Frequency of Social Gatherings with Friends and Family: Not asked  . Attends Religious Services: Not asked  . Active Member of Clubs or Organizations: Not asked  . Attends Banker Meetings: Not on file  . Marital Status: Not on file    Outpatient Encounter Medications as of 05/17/2019  Medication Sig  . Calcium Carbonate-Vit D-Min (CALTRATE 600+D PLUS MINERALS) 600-800 MG-UNIT CHEW Chew by mouth daily.   Marland Kitchen estradiol (ESTRACE) 0.5 MG tablet Take 1 tablet (0.5 mg total) by mouth daily.  . MULTIPLE VITAMINS-MINERALS PO Take 1 tablet by mouth daily.  Marland Kitchen olmesartan (BENICAR) 20 MG tablet Take 1 tablet (20 mg total) by mouth daily.  Marland Kitchen alendronate (FOSAMAX) 70 MG tablet Take by mouth.   No facility-administered encounter medications on file as of 05/17/2019.    Activities of Daily Living In your present state of health, do you have any difficulty performing the following activities: 05/17/2019 06/24/2018  Hearing? N N  Comment declines hearing aids -  Vision? Y N  Comment cataract worsening, plans to go to eye dr soon -  Difficulty concentrating or making decisions? N N  Walking or climbing stairs? N N  Dressing or bathing? N N  Doing  errands, shopping? N N  Preparing Food and eating ? N -  Using the Toilet? N -  In the past six months, have you accidently leaked urine? N -  Do you have problems with loss of bowel control? N -  Managing your Medications? N -  Managing your Finances? N -  Housekeeping or managing your Housekeeping? N -  Some recent data might be hidden    Patient Care Team: Doren Custard, FNP as PCP - General (Family Medicine) Defrancesco, Prentice Docker, MD as Consulting Physician (Obstetrics and Gynecology)    Assessment:  This is a routine wellness examination for [place].  Exercise Activities and Dietary recommendations Current Exercise Habits: Home exercise routine, Type of exercise: calisthenics;walking;strength training/weights, Time (Minutes): 30, Frequency (Times/Week): 3, Weekly Exercise (Minutes/Week): 90, Intensity: Moderate, Exercise limited by: orthopedic condition(s)  Goals    . Patient Stated     Overall being healthier, weight loss and continue exercise.     . Weight (lb) < 130 lb (59 kg)     Recommend weight loss to reach goal of less than 130 pounds. Continue going to curves and TOPS program to reach goal.        Fall Risk Fall Risk  05/17/2019 04/28/2019 06/24/2018 06/18/2017 04/24/2017  Falls in the past year? 0 0 0 No No  Number falls in past yr: 0 0 0 - -  Injury with Fall? 0 0 0 - -  Risk for fall due to : No Fall Risks - - - -  Follow up Falls prevention discussed Falls evaluation completed - - -   FALL RISK PREVENTION PERTAINING TO THE HOME:  Any stairs in or around the home? Yes  If so, do they handrails? Yes   Home free of loose throw rugs in walkways, pet beds, electrical cords, etc? Yes  Adequate lighting in your home to reduce risk of falls? Yes   ASSISTIVE DEVICES UTILIZED TO PREVENT FALLS:  Life alert? No  Use of a cane, walker or w/c? No  Grab bars in the bathroom? No  Shower chair or bench in shower? No  Elevated toilet seat or a handicapped toilet? No    DME ORDERS:  DME order needed?  No   TIMED UP AND GO:  Was the test performed? No . Telephonic visit,   Education: Fall risk prevention has been discussed.  Intervention(s) required? No   Depression Screen PHQ 2/9 Scores 05/17/2019 04/28/2019 06/18/2017 04/24/2017  PHQ - 2 Score 2 0 0 0  PHQ- 9 Score 2 0 - -     Cognitive Function - 6CIT deferred for 2020 AWV. Pt has no memory issues.      6CIT Screen 02/02/2017  What Year? 0 points  What month? 0 points  What time? 0 points  Count back from 20 0 points  Months in reverse 0 points  Repeat phrase 0 points  Total Score 0    Immunization History  Administered Date(s) Administered  . Fluad Quad(high Dose 65+) 02/25/2019  . Influenza, High Dose Seasonal PF 05/08/2016, 02/02/2017, 04/06/2018  . Influenza-Unspecified 03/08/2015, 05/08/2016  . Pneumococcal Conjugate-13 07/31/2015  . Pneumococcal Polysaccharide-23 05/02/2019  . Tdap 04/22/2013    Qualifies for Shingles Vaccine? Yes . Due for Shingrix. Education has been provided regarding the importance of this vaccine. Pt has been advised to call insurance company to determine out of pocket expense. Advised may also receive vaccine at local pharmacy or Health Dept. Verbalized acceptance and understanding.  Tdap: Up to date  Flu Vaccine: Up to date  Pneumococcal Vaccine: Up to date    Screening Tests Health Maintenance  Topic Date Due  . Hepatitis C Screening  03-16-1946  . MAMMOGRAM  02/14/2021  . COLONOSCOPY  02/21/2023  . TETANUS/TDAP  04/23/2023  . INFLUENZA VACCINE  Completed  . DEXA SCAN  Completed  . PNA vac Low Risk Adult  Completed    Cancer Screenings:  Colorectal Screening: Completed 02/20/13. Repeat every 10 years;   Mammogram: Completed 02/15/19. Repeat every year.  Bone Density: Completed 02/16/19. Results reflect  OSTEOPOROSIS. Repeat every  2 years.   Lung Cancer Screening: (Low Dose CT Chest recommended if Age 68-80 years, 30 pack-year  currently smoking OR have quit w/in 15years.) does not qualify.    Additional Screening:  Hepatitis C Screening: does qualify; postponed  Vision Screening: Recommended annual ophthalmology exams for early detection of glaucoma and other disorders of the eye. Is the patient up to date with their annual eye exam?  No  Who is the provider or what is the name of the office in which the pt attends annual eye exams? [facility]  Dental Screening: Recommended annual dental exams for proper oral hygiene  Community Resource Referral:  CRR required this visit?  No      Plan:     I have personally reviewed and addressed the Medicare Annual Wellness questionnaire and have noted the following in the patient's chart:  A. Medical and social history B. Use of alcohol, tobacco or illicit drugs  C. Current medications and supplements D. Functional ability and status E.  Nutritional status F.  Physical activity G. Advance directives H. List of other physicians I.  Hospitalizations, surgeries, and ER visits in previous 12 months J.  Vitals K. Screenings such as hearing and vision if needed, cognitive and depression L. Referrals and appointments   In addition, I have reviewed and discussed with patient certain preventive protocols, quality metrics, and best practice recommendations. A written personalized care plan for preventive services as well as general preventive health recommendations were provided to patient.   Signed,  Reather LittlerKasey Pauletta Pickney, LPN Nurse Health Advisor   Nurse Notes: none

## 2019-06-17 ENCOUNTER — Telehealth: Payer: Self-pay | Admitting: Obstetrics and Gynecology

## 2019-06-17 NOTE — Telephone Encounter (Signed)
Pt came in 12/17 and the pt has medicare and the pt was charged for this visit. The pt stated that she thought it was covered. Pt is requesting a call back. Please advise

## 2019-06-17 NOTE — Telephone Encounter (Signed)
Please advise. Thanks Chenell Lozon 

## 2019-06-21 NOTE — Telephone Encounter (Signed)
Pt aware all dx codes are correct. Will change primary dx to post-menopause on hrt to see if medicare will pay for visit. Advised pt that it may be 6 weeks before she sees a change in her bill. Pt has billing number for further questions. Pt appreciative of call.

## 2019-07-10 IMAGING — CR DG ABDOMEN 1V
1 series · 2 of 2 positions shown · non-contrast
Comparison: None.

CLINICAL DATA: Acute generalized abdominal pain.  Constipation.

EXAM:
ABDOMEN - 1 VIEW

[Series 1: dg abd 1 view · 0.14mm/px · 2 of 2 slices shown]
[im 1/2]
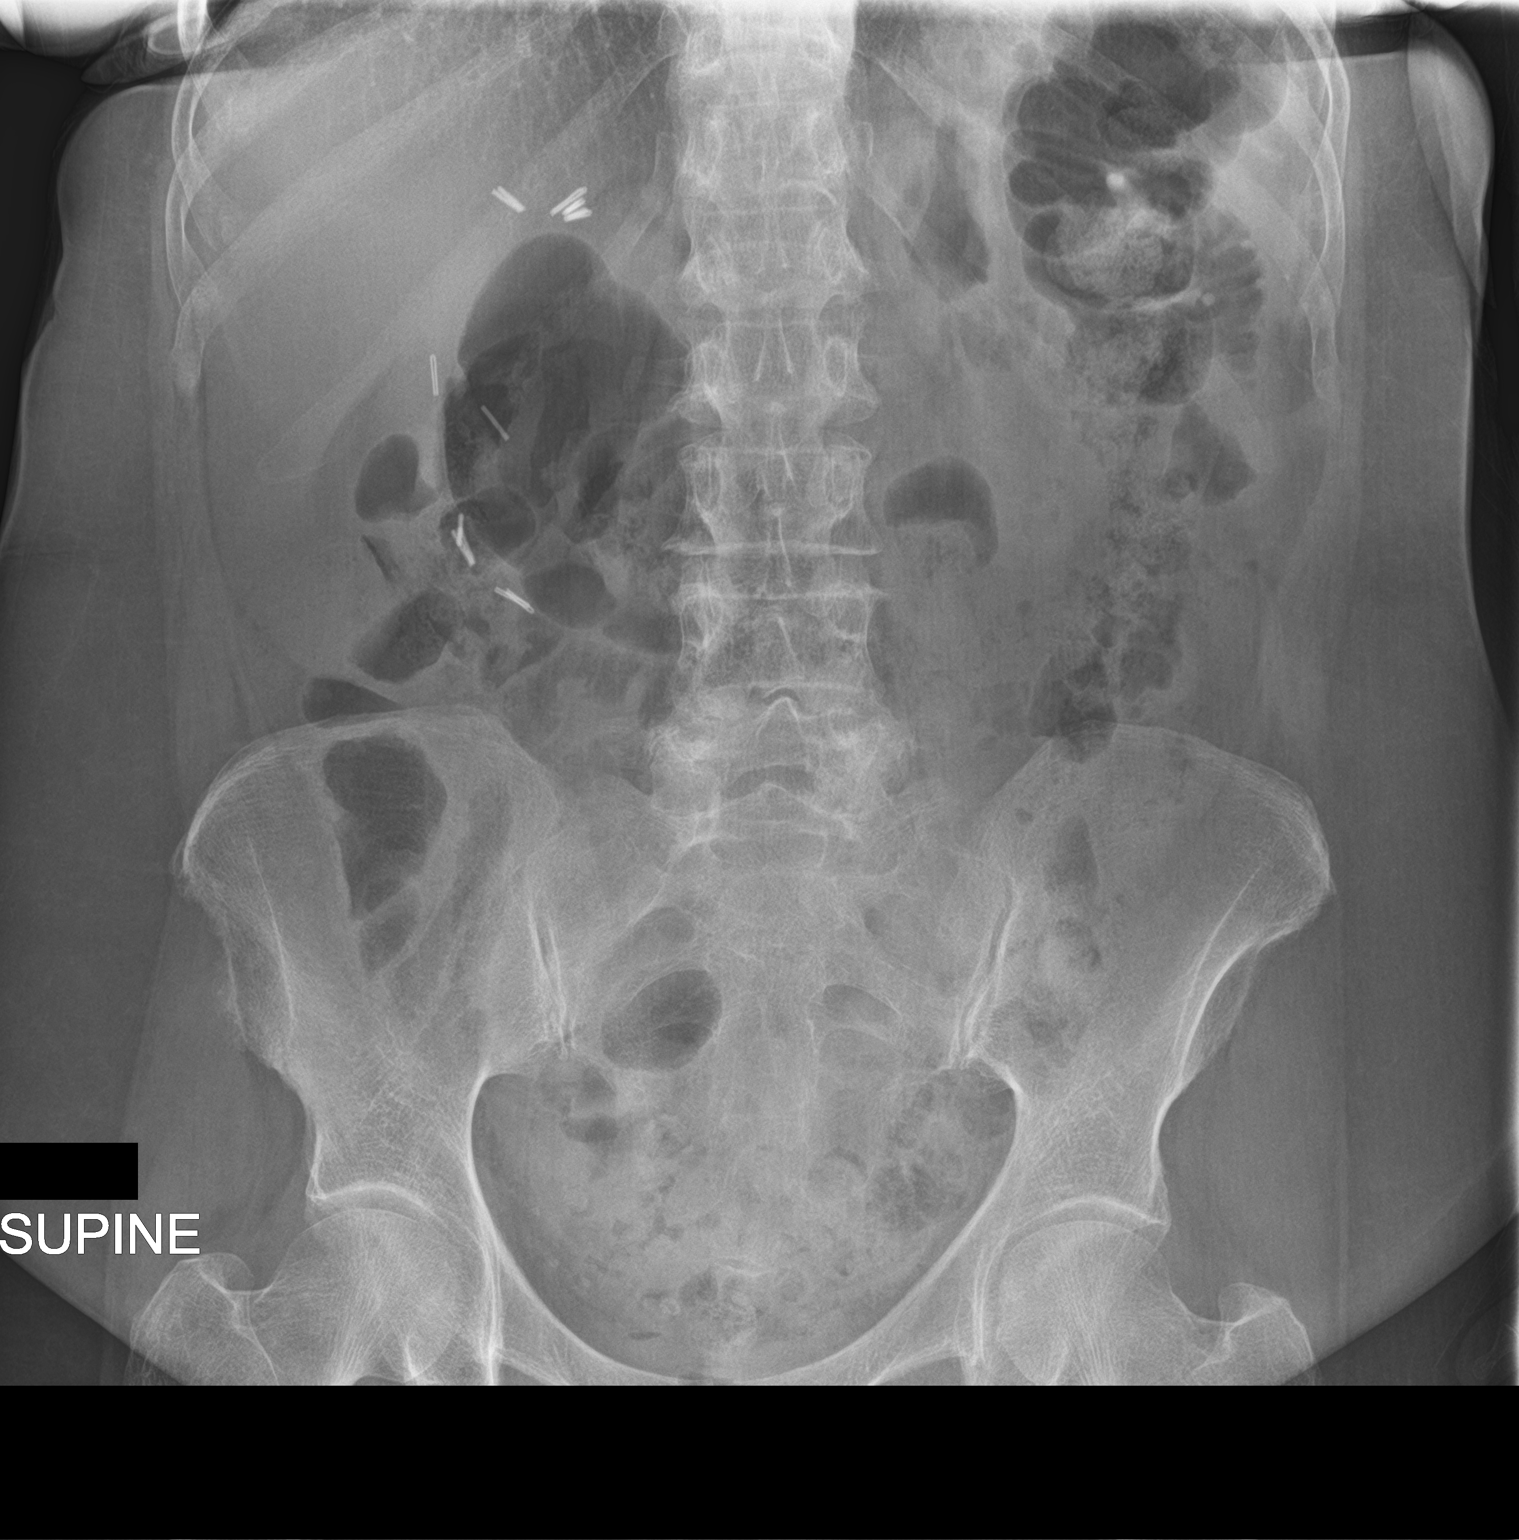
[im 2/2]
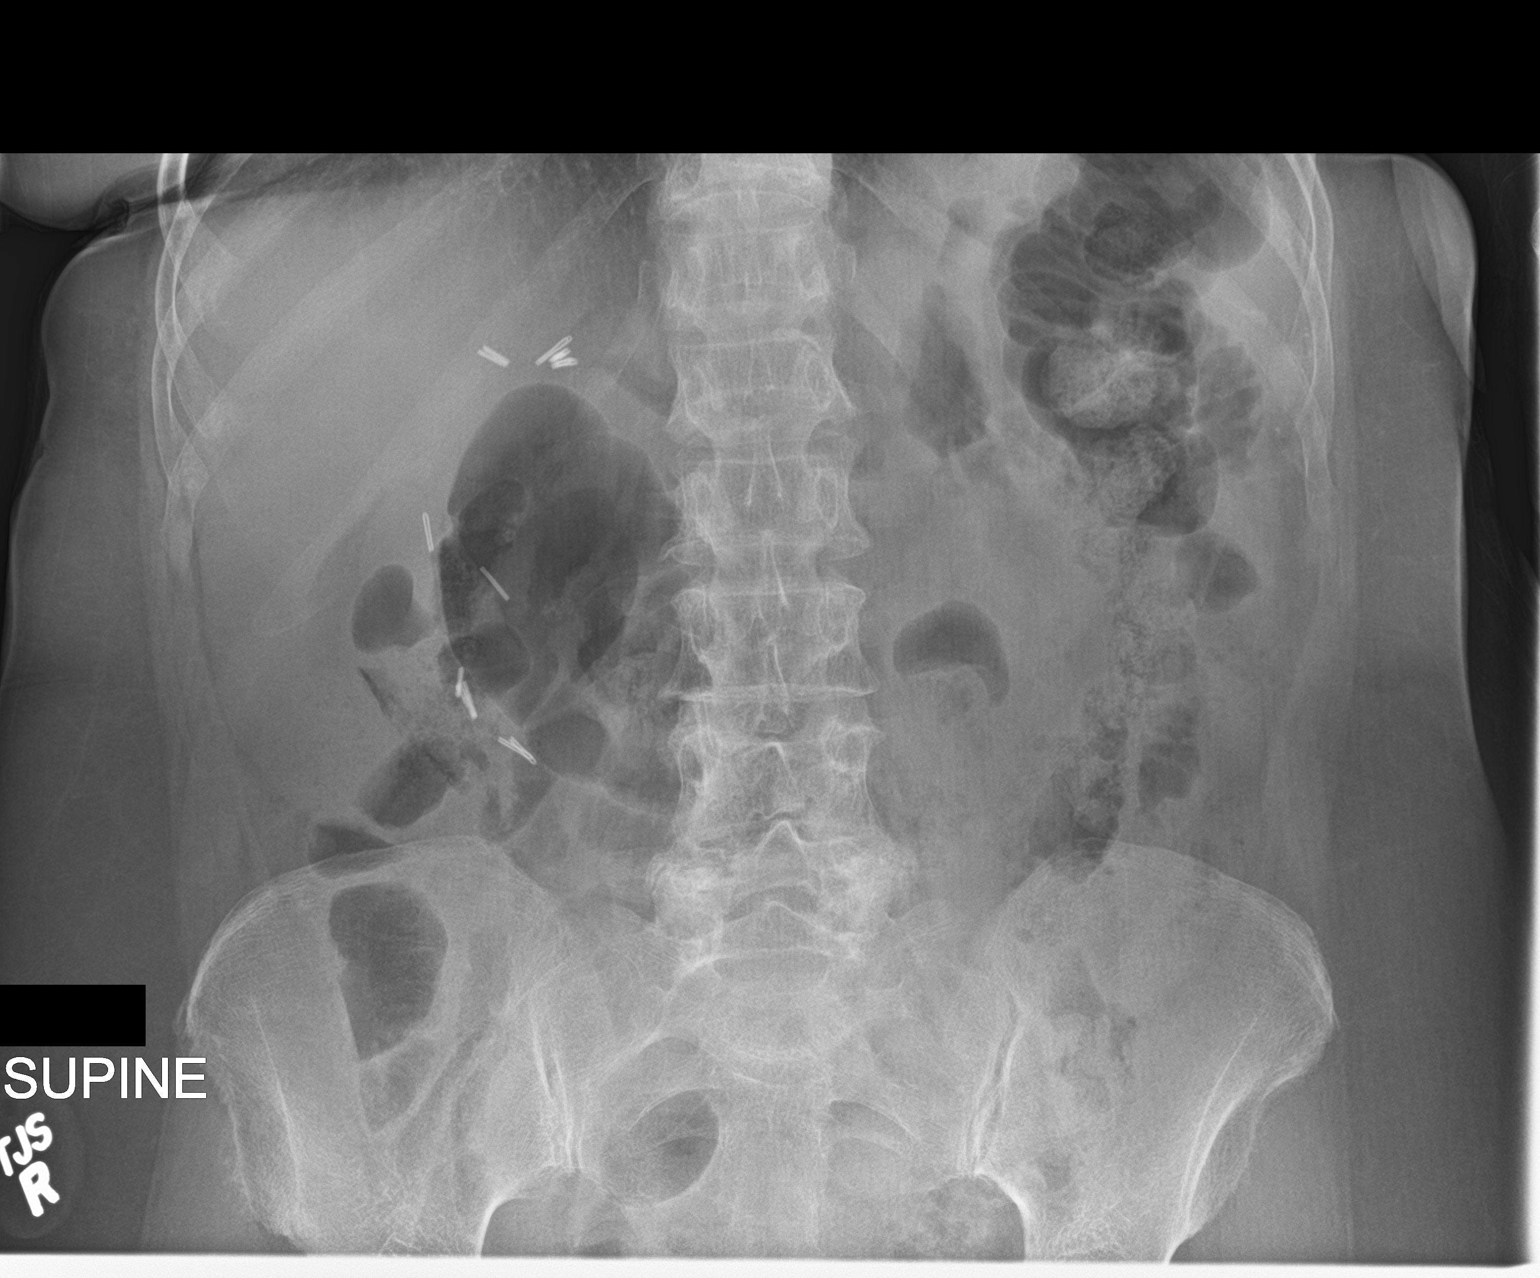

[2 of 2 positions shown; findings below may reference images not displayed]

FINDINGS: No abnormal bowel dilatation is noted. Status post cholecystectomy.
No abnormal calcifications are noted. Large amount of stool seen
throughout the colon.
IMPRESSION: Large stool burden.  No abnormal bowel dilatation is noted.

## 2019-08-15 ENCOUNTER — Telehealth: Payer: Self-pay | Admitting: Obstetrics and Gynecology

## 2019-08-15 NOTE — Telephone Encounter (Signed)
Patient called in saying she has an upcoming appointment for a physical per cherry. Patient said that Medicare will not cover that and she was wondering if this will be an old fashioned physical appointment or is it just a pelvic exam (patient stated that medicare will cover appointment if it is a pelvic exam. Patient wants this appointment pushed out another month. But given Cherrys schedule I'm not seeing any slots avaiable for a physical for April. I told patient I would leave a message for the nurse and decide what exactly kind of appointment this will be before I went ahead and scheduled it.

## 2019-08-17 ENCOUNTER — Encounter: Payer: Medicare Other | Admitting: Obstetrics and Gynecology

## 2019-09-01 ENCOUNTER — Other Ambulatory Visit: Payer: Self-pay

## 2019-09-01 ENCOUNTER — Encounter: Payer: Self-pay | Admitting: Obstetrics and Gynecology

## 2019-09-01 ENCOUNTER — Ambulatory Visit (INDEPENDENT_AMBULATORY_CARE_PROVIDER_SITE_OTHER): Payer: Medicare Other | Admitting: Obstetrics and Gynecology

## 2019-09-01 VITALS — BP 130/79 | HR 71 | Ht 59.0 in | Wt 146.0 lb

## 2019-09-01 DIAGNOSIS — E669 Obesity, unspecified: Secondary | ICD-10-CM

## 2019-09-01 DIAGNOSIS — N816 Rectocele: Secondary | ICD-10-CM | POA: Diagnosis not present

## 2019-09-01 DIAGNOSIS — N393 Stress incontinence (female) (male): Secondary | ICD-10-CM

## 2019-09-01 DIAGNOSIS — Z79890 Hormone replacement therapy (postmenopausal): Secondary | ICD-10-CM | POA: Diagnosis not present

## 2019-09-01 DIAGNOSIS — M810 Age-related osteoporosis without current pathological fracture: Secondary | ICD-10-CM

## 2019-09-01 DIAGNOSIS — N8110 Cystocele, unspecified: Secondary | ICD-10-CM | POA: Diagnosis not present

## 2019-09-01 DIAGNOSIS — Z90710 Acquired absence of both cervix and uterus: Secondary | ICD-10-CM

## 2019-09-01 NOTE — Progress Notes (Signed)
ANNUAL PREVENTATIVE CARE GYNECOLOGY  ENCOUNTER NOTE  Subjective:       Zoe Alvarado is a 74 y.o. [phone] female here for a routine pelvic/breast  exam. She is s/p TAH in 1993 for abnormal uterine bleeding. 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 The patient is not sexually active. The patient is taking hormone replacement therapy and has been on this regimen for 2-3. Patient denies post-menopausal vaginal bleeding. The patient wears seatbelts: no. The patient participates in regular exercise: yes.    Current complaints: 1.  Plans to have cataract surgery soon. Otherwise doing well.  2. Stress incontinence still present, but has improved some with Kegels.     Gynecologic History No LMP recorded. Patient has had a hysterectomy. Last Pap: no longer needed. No prior history of abnormal pap smears.  Last mammogram: 02/15/2019. Results were: normal Last Colonoscopy: Patient reports doing stool cards, last resulted 04/2017.  Last Dexa Scan: 02/16/2019.  Osteoporosis, T score is -3.4.   Obstetric History OB History  Gravida Para Term Preterm AB Living  4 1     1 3   SAB TAB Ectopic Multiple Live Births    1     3    # Outcome Date GA Lbr Len/2nd Weight Sex Delivery Anes PTL Lv  4 Gravida 1981    M Vag-Spont   LIV  3 Gravida 1978    M Vag-Spont   LIV  2 Para 1976    M Vag-Spont   LIV  1 TAB             Past Medical History:  Diagnosis Date  . Acute non-recurrent maxillary sinusitis 03/20/2016  . Allergy   . 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 Cataract years  . Hyperlipidemia   . Hypertension   . Osteoporosis   . Prolapse of female pelvic organs     Family History  Problem Relation Age of Onset  . Breast cancer Maternal Aunt 40  . Breast cancer Maternal Grandmother   . Cancer Maternal Grandmother   . Diabetes Mother   . Heart disease Father   . Hypertension Father   . Prostate cancer Father   . Cancer Father   . Vision loss Father   . Diabetes Brother   . Cancer  Maternal Aunt   . Ovarian cancer Neg Hx   . Colon cancer Neg Hx     Past Surgical History:  Procedure Laterality Date  . ABDOMINAL HYSTERECTOMY    . CHOLECYSTECTOMY    . DENTAL SURGERY    . DILATION AND CURETTAGE OF UTERUS    . KNEE SURGERY    . TONSILLECTOMY      Social History   Socioeconomic History  . Marital status: Married    Spouse name: Not on file  . Number of children: 3  . Years of education: Not on file  . Highest education level: Not on file  Occupational History  . Not on file  Tobacco Use  . Smoking status: Never Smoker  . Smokeless tobacco: Never Used  Substance and Sexual Activity  . Alcohol use: No    Alcohol/week: 0.0 standard drinks  . Drug use: No  . Sexual activity: Yes    Partners: Male    Birth control/protection: Surgical  Other Topics Concern  . Not on file  Social History Narrative   2nd marriage   3 living sons, 2 deceased   Social Determinants of Health   Financial Resource Strain:   .  Difficulty of Paying Living Expenses:   Food Insecurity:   . Worried About Charity fundraiser in the Last Year:   . Arboriculturist in the Last Year:   Transportation Needs:   . Film/video editor (Medical):   Marland Kitchen Lack of Transportation (Non-Medical):   Physical Activity:   . Days of Exercise per Week:   . Minutes of Exercise per Session:   Stress:   . Feeling of Stress :   Social Connections:   . Frequency of Communication with Friends and Family:   . Frequency of Social Gatherings with Friends and Family:   . Attends Religious Services:   . Active Member of Clubs or Organizations:   . Attends Archivist Meetings:   Marland Kitchen Marital Status:   Intimate Partner Violence:   . Fear of Current or Ex-Partner:   . Emotionally Abused:   Marland Kitchen Physically Abused:   . Sexually Abused:     Current Outpatient Medications on File Prior to Visit  Medication Sig Dispense Refill  . alendronate (FOSAMAX) 70 MG tablet Take by mouth.    . Calcium  Carbonate-Vit D-Min (CALTRATE 600+D PLUS MINERALS) 600-800 MG-UNIT CHEW Chew by mouth daily.     Marland Kitchen estradiol (ESTRACE) 0.5 MG tablet Take 1 tablet (0.5 mg total) by mouth daily. 90 tablet 3  . MULTIPLE VITAMINS-MINERALS PO Take 1 tablet by mouth daily.    Marland Kitchen olmesartan (BENICAR) 20 MG tablet Take 1 tablet (20 mg total) by mouth daily. 90 tablet 1  . UNABLE TO FIND daily. Balance of Nature     No current facility-administered medications on file prior to visit.    Allergies  Allergen Reactions  . Codeine   . Penicillins       Review of Systems ROS Review of Systems - General ROS: negative for - chills, fatigue, fever, hot flashes, night sweats, weight gain or weight loss Psychological ROS: negative for - anxiety, decreased libido, depression, mood swings, physical abuse or sexual abuse Ophthalmic ROS: negative for - blurry vision, eye pain or loss of vision ENT ROS: negative for - headaches, hearing change, visual changes or vocal changes Allergy and Immunology ROS: negative for - hives, itchy/watery eyes or seasonal allergies Hematological and Lymphatic ROS: negative for - bleeding problems, bruising, swollen lymph nodes or weight loss Endocrine ROS: negative for - galactorrhea, hair pattern changes, hot flashes, malaise/lethargy, mood swings, palpitations, polydipsia/polyuria, skin changes, temperature intolerance or unexpected weight changes Breast ROS: negative for - new or changing breast lumps or nipple discharge Respiratory ROS: negative for - cough or shortness of breath Cardiovascular ROS: negative for - chest pain, irregular heartbeat, palpitations or shortness of breath Gastrointestinal ROS: no abdominal pain, change in bowel habits, or black or bloody stools Genito-Urinary ROS: no dysuria, trouble voiding, or hematuria. Positive for stress incontinence.  Musculoskeletal ROS: negative for - joint pain or joint stiffness Neurological ROS: negative for - bowel and bladder  control changes Dermatological ROS: negative for rash and skin lesion changes   Objective:   BP 130/79   Pulse 71   Ht 4\' 11"  (1.499 m)   Wt 146 lb (66.2 kg)   BMI 29.49 kg/m  CONSTITUTIONAL: Well-developed, well-nourished female in no acute distress.  PSYCHIATRIC: Normal mood and affect. Normal behavior. Normal judgment and thought content. [place]: Alert and oriented to person, place, and time. Normal muscle tone coordination. No cranial nerve deficit noted. HENT:  Normocephalic, atraumatic, External right and left ear normal. Oropharynx is  clear and moist EYES: Conjunctivae and EOM are normal. Pupils are equal, round, and reactive to light. No scleral icterus.  NECK: Normal range of motion, supple, no masses.  Normal thyroid.  SKIN: Skin is warm and dry. No rash noted. Not diaphoretic. No erythema. No pallor. CARDIOVASCULAR: Normal heart rate noted, regular rhythm, no murmur. RESPIRATORY: Clear to auscultation bilaterally. Effort and breath sounds normal, no problems with respiration noted. BREASTS: Symmetric in size. No masses, skin changes, nipple drainage, or lymphadenopathy. ABDOMEN: Soft, normal bowel sounds, no distention noted.  No tenderness, rebound or guarding.  BLADDER: Normal PELVIC:  Bladder no bladder distension noted  Urethra: normal appearing urethra with no masses, tenderness or lesions  Vulva: normal appearing vulva with no masses, tenderness or lesions  Vagina: normal appearing vagina with normal color and discharge, no lesions  Cervix: normal appearing cervix without discharge or lesions  Uterus: surgically absent, vaginal cuff well healed  Adnexa: surgically absent bilateral  RV: External Exam NormaI, No Rectal Masses and Normal Sphincter tone  MUSCULOSKELETAL: Normal range of motion. No tenderness.  No cyanosis, clubbing, or edema.  2+ distal pulses. LYMPHATIC: No Axillary, Supraclavicular, or Inguinal Adenopathy.   Labs: Lab Results  Component Value  Date   WBC 7.0 04/22/2017   HGB 12.3 04/22/2017   HCT 34.9 (L) 04/22/2017   MCV 84.7 04/22/2017   PLT 260 04/22/2017    Lab Results  Component Value Date   CREATININE 0.56 (L) 05/02/2019   BUN 6 (L) 05/02/2019   NA 138 05/02/2019   K 3.8 05/02/2019   CL 104 05/02/2019   CO2 26 05/02/2019    Lab Results  Component Value Date   ALT 11 05/02/2019   AST 18 05/02/2019   ALKPHOS 106 08/03/2015   BILITOT 0.9 05/02/2019    Lab Results  Component Value Date   CHOL 165 05/02/2019   HDL 61 05/02/2019   LDLCALC 85 05/02/2019   TRIG 99 05/02/2019   CHOLHDL 2.7 05/02/2019    Lab Results  Component Value Date   TSH 1.07 06/24/2018    No results found for: HGBA1C   Assessment:   1. Cystocele with rectocele   2. Obesity (BMI 30.0-34.9)   3. Post-menopause on HRT (hormone replacement therapy)   4. Age-related osteoporosis without current pathological fracture   5. Stress incontinence   6. S/P hysterectomy     Plan:  Cystocele with enterodele/rectocele, currently using Kegels, but is ok for referral to pelvic floor physical therapy. Is not open to management with pessary or surgery at this time.  Pap: Not needed.  Beyond age of screening.  Mammogram: Ordered by PCP.  Stool Guaiac Testing:  Not Ordered.   Labs: No labs ordered.  Routine preventative health maintenance measures emphasized: Exercise/Diet/Weight control, Tobacco Warnings, Alcohol/Substance use risks, Stress Management, Peer Pressure Issues and Safe Sex Menopausal, on HRT. This woman has a 2-3 year history of HRT exposure with study data showing increased risk of thrombo-embolic events such as myocardial infarction stroke and breast cancer after 4 or more years exposure to combination products with estrogen and progesterone. She is advised that she may wish to discontinue the HRT at any time.. Her personal risk factors have been reviewed carefully with her today.  Osteoporosis, to continue use of her estrogen,  Vitamin D, and calcium.  Return to Clinic - 1 Year   Hildred Laser, MD [facility]

## 2019-09-01 NOTE — Progress Notes (Signed)
Pt present for pelvic check. Pt stated that she was doing well no problems.

## 2019-09-01 NOTE — Patient Instructions (Signed)

## 2019-10-06 NOTE — Telephone Encounter (Signed)
Patient was placed on schedule.

## 2019-10-26 ENCOUNTER — Other Ambulatory Visit: Payer: Self-pay

## 2019-10-26 ENCOUNTER — Encounter: Payer: Self-pay | Admitting: Physical Therapy

## 2019-10-26 ENCOUNTER — Ambulatory Visit: Payer: Medicare Other | Attending: Obstetrics and Gynecology | Admitting: Physical Therapy

## 2019-10-26 DIAGNOSIS — M25561 Pain in right knee: Secondary | ICD-10-CM | POA: Diagnosis present

## 2019-10-26 DIAGNOSIS — R2689 Other abnormalities of gait and mobility: Secondary | ICD-10-CM | POA: Diagnosis present

## 2019-10-26 DIAGNOSIS — G8929 Other chronic pain: Secondary | ICD-10-CM

## 2019-10-26 DIAGNOSIS — M6281 Muscle weakness (generalized): Secondary | ICD-10-CM

## 2019-10-26 NOTE — Patient Instructions (Signed)
  Proper body mechanics with getting out of a chair to decrease strain  on back &pelvic floor   Avoid holding your breath when Getting out of the chair:  Scoot to front part of chair chair Heels behind feet, feet are hip width apart, nose over toes  Inhale like you are smelling roses Exhale to stand   ___  Getting out bed  Exhale as you push up from sidelying   ____   Wear shoe lift in R    ____

## 2019-10-27 ENCOUNTER — Ambulatory Visit: Payer: Medicare Other | Admitting: Family Medicine

## 2019-10-27 NOTE — Therapy (Signed)
[place] [facility] MAIN [facility] SERVICES [address] [place], [place], [address] Phone: [phone]   Fax:  [phone]  Physical Therapy Evaluation  Patient Details  Name: Zoe Alvarado MRN: [id number] Date of Birth: 10-13-1945 Referring Provider (PT): Hildred Laser    Encounter Date: 10/26/2019   PT End of Session - 10/26/19 1022    Visit Number 1    Number of Visits 10    Date for PT Re-Evaluation 01/04/20   eval 10/26/19   PT Start Time 1003    PT Stop Time 1100    PT Time Calculation (min) 57 min           Past Medical History:  Diagnosis Date  . Acute non-recurrent maxillary sinusitis 03/20/2016  . Allergy   . 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 Cataract years  . Hyperlipidemia   . Hypertension   . Osteoporosis   . Prolapse of female pelvic organs     Past Surgical History:  Procedure Laterality Date  . ABDOMINAL HYSTERECTOMY    . CHOLECYSTECTOMY    . DENTAL SURGERY    . DILATION AND CURETTAGE OF UTERUS    . KNEE SURGERY    . TONSILLECTOMY      There were no vitals filed for this visit.   Subjective Assessment - 10/26/19 1013    Subjective 1) SUI occurs when she has been in bed and needs to get up to the toilet and when she has been sitting. Sometimes when he is coughing and she feels a downward feeling.     2) prolapse: pt feels she needs to hurry to the bathroom and someimtes leaks before getting there. Pt tries to do kegels on her own and noticed a decreased with leakage but it still leaks. Pt noticed she has not totally emptied with urination. Pt notices a bulge there and she has tried to push it back in with her fingers.    3) constipation:  Bowel movements occur daily and 50% of the time stools are hard. Sometimes straining.      4)  R knee pain  ( Hx of arthorscope and recommended for TKA but surgery has not been scheduled). Pt is limited by the knee pain. Average 4-5 /10 and increases to 7-8/10 after [employer] walking  but she sends her husband to the store. Pt is not able to walk anymore for exercises.Pt is not able to bend her knee well and climb stairs.  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      Pertinent History prolapse of pelvic organs, abdominal hysterectomy, D & C due to marriage, gall bladder removal, osteoporosis (report in records reviewed) , R knee pain, Hx of CA. No LBP. No dyspareunia.    Patient Stated Goals to avoid surgery and improve continence and improve R knee pain              [facility] PT Assessment - 10/27/19 1544      Assessment   Medical Diagnosis SUI    Referring Provider (PT) Hildred Laser       Precautions   Precautions None      Restrictions   Weight Bearing Restrictions No      Balance Screen   Has the patient fallen in the past 6 months No      Home Environment   Living Environment Private residence    Living Arrangements Spouse/significant other    Type of Home House  Home Access Stairs to enter    Entrance Stairs-Number of Steps 6    Home Layout Multi-level      Prior Function   Level of Independence Independent      Observation/Other Assessments   Scoliosis L shoulder lowered, R iliac crest/patella lowered, L upper lumbar convex curve ( with R shoe lift: levelled iliac crest, patella )        Sit to Stand   Comments breathholding , 5 TST 18 sec      Strength   Overall Strength Comments B hip flex 4/5, R knee flex/ext 3+/5, L 4/5        Bed Mobility   Bed Mobility --   breathholding      Ambulation/Gait   Gait velocity 1.0 m/s ( pre Tx)  1.17 m/s ( post Tx)     Gait Comments narrow BOS, excessive sway at L/S, antalgic gait, decreased weight bearing on R, ( pre Tx),   less excessive sway at pelvis, equal WBing ( Post Tx with R shoe lift)                          [facility] Adult PT Treatment/Exercise - 10/26/19 1026      Therapeutic Activites    Therapeutic Activities Other Therapeutic Activities    Other Therapeutic  Activities explained deep core function and ways to minimzie prolapse, incontinence        Neuro Re-ed    Neuro Re-ed Details  cued for exhalation to coactivate deep core in sit to stand and bed mobility to minimize straining pelvic floor                         PT Long Term Goals - 10/27/19 1543      PT LONG TERM GOAL #1   Title Pt will demo decreased time with 5 TST from 18 sec to < 17 sec in order to minimize falls    Time 4    Status New      PT LONG TERM GOAL #2   Title Pt will report increased stool consistency to Type 4 100% of the time and no straining with all bowel movements in order to minimize lowered position of pelvic organs and improve continence    Time 6    Period Weeks    Status New      PT LONG TERM GOAL #3   Title Pt will be able to climb stairs in her house safely with less knee pain by 50% in order to get to her 2nd floor safely    Time 4    Status New      PT LONG TERM GOAL #4   Title Pt will demo co-activation with pelvic floor in order to not leak with getting up from bed and sitting in order to improve continence    Time 6    Period Weeks    Status New      PT LONG TERM GOAL #5   Title Pt will be compliant with shoe lift wear and demo levelled iliac crest and less spinal deviations in order to progress to gait training and deep core strengthening to minimize urinary incontinence    Time 2    Period Weeks    Status New      Additional Long Term Goals   Additional Long Term Goals Yes      PT LONG TERM GOAL #6  Title Pt will increase FOTO prolapse pts 25 pts, Urinary 49 pts with increased points of > 8 pts in order to improve QOL    Time 10    Period Weeks    Status New      PT LONG TERM GOAL #7   Title Pt will demo proper body mechanics to minimize straining pelvic floor and worsening prolapse    Time 2    Period Weeks    Status New    Target Date 11/10/19      PT LONG TERM GOAL #8   Title Pt will demo increased R LE strength  from R knee flex/ext 3+/5 to > 4/5 in order to improve gait    Time 8    Period Weeks    Status New    Target Date 12/22/19                 Plan - 10/27/19 1453    Clinical Impression Statement Pt is a 74 yo woman who reports of SUI, constipation, prolapse, and R knee pain. These issues impact her QOL and impact her mobility and participation in community events. Pt's musculoskeletal assessment revealed a shorter leg length, scoliosis limited spinal /pelvic mobility, dyscoordination and strength of pelvic floor mm, weak hip weakness, gait deviations, poor body mechanics which places strain on the abdominal/pelvic floor mm.   Pt was provided education on etiology of Sx with anatomy, physiology explanation with images along with the benefits of customized pelvic PT Tx based on pt's medical conditions and musculoskeletal deficits.  Explained the physiology of deep core mm coordination and roles of pelvic floor function in urination, defecation, sexual function, and postural control with deep core mm system.   Following Tx today, pt demo'd equal alignment of pelvic girdle with shoe lift in R shoe and improved gait pattern. Pt reported less pain with walking. Once pt's pelvic alignment has improved, pelvic assessments and treatments will yield longer lasting changes.  Pt benefits from skilled PT        Personal Factors and Comorbidities Comorbidity 3+    Comorbidities prolapse of pelvic organs, abdominal hysterectomy, D & C due to marriage, gall bladder removal, osteoporosis (report in records reviewed) , R knee pain, Hx of CA.    Examination-Activity Limitations Squat;Stairs;Stand;Toileting;Sit    Stability/Clinical Decision Making Evolving/Moderate complexity    Clinical Decision Making Moderate    Rehab Potential Good    PT Frequency 1x / week    PT Duration Other (comment)   10   PT Treatment/Interventions Therapeutic activities;Patient/family education;Therapeutic  exercise;Neuromuscular re-education;Moist Heat;Scar mobilization;Balance training;Manual techniques;Splinting;Taping;Gait training;Functional mobility training           Patient will benefit from skilled therapeutic intervention in order to improve the following deficits and impairments:  Decreased coordination, Decreased range of motion, Abnormal gait, Decreased endurance, Decreased safety awareness, Increased muscle spasms, Decreased balance, Decreased strength, Decreased mobility, Impaired sensation, Postural dysfunction, Improper body mechanics, Decreased scar mobility, Impaired flexibility, Hypomobility, Pain, Decreased knowledge of precautions  Visit Diagnosis: Other abnormalities of gait and mobility  Muscle weakness (generalized)  Chronic pain of right knee     Problem List Patient Active Problem List   Diagnosis Date Noted  . Cystocele with rectocele 05/05/2019  . Post-menopause on HRT (hormone replacement therapy) 05/05/2019  . OP (osteoporosis) 01/15/2017  . Arthritis, degenerative 01/15/2017  . Climacteric 01/15/2017  . Obesity (BMI 30.0-34.9) 05/02/2015  . Hypertension 12/06/2014  . Dyslipidemia 12/06/2014    Jerl Mina ,PT,  DPT, E-RYT  10/27/2019, 3:46 PM  [place] [facility] MAIN [facility] SERVICES [address] [place], [place], [address] Phone: [phone]   Fax:  [phone]  Name: Zoe Alvarado MRN: [id number] Date of Birth: 08-06-1945

## 2019-10-31 ENCOUNTER — Encounter: Payer: Self-pay | Admitting: Family Medicine

## 2019-10-31 ENCOUNTER — Ambulatory Visit (INDEPENDENT_AMBULATORY_CARE_PROVIDER_SITE_OTHER): Payer: Medicare Other | Admitting: Family Medicine

## 2019-10-31 ENCOUNTER — Other Ambulatory Visit: Payer: Self-pay

## 2019-10-31 VITALS — BP 118/76 | HR 81 | Temp 98.2°F | Resp 14 | Ht 59.0 in | Wt 144.6 lb

## 2019-10-31 DIAGNOSIS — I10 Essential (primary) hypertension: Secondary | ICD-10-CM | POA: Diagnosis not present

## 2019-10-31 DIAGNOSIS — Z5181 Encounter for therapeutic drug level monitoring: Secondary | ICD-10-CM | POA: Diagnosis not present

## 2019-10-31 DIAGNOSIS — M1711 Unilateral primary osteoarthritis, right knee: Secondary | ICD-10-CM

## 2019-10-31 DIAGNOSIS — M810 Age-related osteoporosis without current pathological fracture: Secondary | ICD-10-CM | POA: Diagnosis not present

## 2019-10-31 LAB — COMPLETE METABOLIC PANEL WITH GFR
AG Ratio: 1.5 (calc) (ref 1.0–2.5)
ALT: 13 U/L (ref 6–29)
AST: 17 U/L (ref 10–35)
Albumin: 4 g/dL (ref 3.6–5.1)
Alkaline phosphatase (APISO): 85 U/L (ref 37–153)
BUN: 12 mg/dL (ref 7–25)
CO2: 26 mmol/L (ref 20–32)
Calcium: 9.1 mg/dL (ref 8.6–10.4)
Chloride: 107 mmol/L (ref 98–110)
Creat: 0.65 mg/dL (ref 0.60–0.93)
GFR, Est African American: 103 mL/min/{1.73_m2} (ref >=60)
GFR, Est Non African American: 89 mL/min/{1.73_m2} (ref >=60)
Globulin: 2.7 g/dL (calc) (ref 1.9–3.7)
Glucose, Bld: 96 mg/dL (ref 65–99)
Potassium: 4.1 mmol/L (ref 3.5–5.3)
Sodium: 140 mmol/L (ref 135–146)
Total Bilirubin: 0.5 mg/dL (ref 0.2–1.2)
Total Protein: 6.7 g/dL (ref 6.1–8.1)

## 2019-10-31 LAB — VITAMIN D 25 HYDROXY (VIT D DEFICIENCY, FRACTURES): Vit D, 25-Hydroxy: 21 ng/mL — ABNORMAL LOW (ref 30–100)

## 2019-10-31 NOTE — Patient Instructions (Addendum)
We are going to decrease your olmesartan from 20 mg to 10 mg daily - monitor your BP for the next month or so  Ok with Bp 100/60-130/80 and occassionally 140/90 as a top number is okay as long as that is not often or your new average.  If you still are having low end of normal BP's you can try and break it in half and see how you do with 5 mg - just let me know

## 2019-10-31 NOTE — Progress Notes (Signed)
Name: Zoe Alvarado   MRN: [id number]    DOB: [birth year]   Date:10/31/2019       Progress Note  Chief Complaint  Patient presents with  . Follow-up  . Hypertension     Subjective:   Zoe Alvarado is a 74 y.o. female, presents to clinic for routine follow up on the conditions listed above.  Patient new to me, she presents for follow-up on her blood pressure which is managed with Benicar 20 mg daily.  Hypertension:  Currently managed on benicar 20 mg -she monitors her blood pressure occasionally, she is not having any side effects or symptoms of her medicines.  She brings in a paper of readings over the past 10 months roughly with blood pressure range from 90/50 -110's/70.   Very rare that she has a systolic blood pressure in the 120s She had been working with her PCP many years ago about decreasing her medicine dose and trying to get off medication.  She would still like to do that although her weight is up a little bit from her weight goal of 130 pounds. She denies any symptoms with lower readings specifically denies no lightheadedness, hypotension, syncope. Blood pressure today is well controlled. BP Readings from Last 3 Encounters:  10/31/19 118/76  09/01/19 130/79  05/17/19 112/67   Pt denies CP, SOB, exertional sx, LE edema, palpitation, Ha's, visual disturbances Dietary efforts for BP?  Overall healthy diet  Patient has diagnosis of osteoporosis-diagnosed at least 5 years ago, earliest DEXA I can see in chart was 10/12/2014 Repeated DEXA last September on 02/16/2019  She was prescribed Fosamax about 6 months ago but she did not start it for several months.  She did check with her dentist prior to starting the medication and they said it would be okay for her to take.  She has been careful to take medications as instructed and she is nervous about having any dysphagia or esophagus side effects of the medication but she has not had any dysphagia at all.  She is supplementing with  vitamin D and calcium.  Reviewed her bone densities Dexa:  AP Spine L2-L3 02/16/2019 71.5 Osteoporosis -3.4 0.797 g/cm2 AP Spine L2-L3 10/12/2014 67.2 Osteoporosis -3.4 0.800 g/cm2  DualFemur Total Right 02/16/2019 71.5 Osteoporosis -2.9 0.644 g/cm2 DualFemur Total Right 10/12/2014 67.2 Osteoporosis -2.7 0.669 g/cm2  DualFemur Total Mean 02/16/2019 71.5 Osteoporosis -2.7 0.666 g/cm2 DualFemur Total Mean 10/12/2014 67.2 Osteoporosis -2.5 0.688 g/cm2  Left Forearm Radius 33% 02/16/2019 71.5 Osteoporosis -2.5 0.658 g/cm2 Left Forearm Radius 33% 10/12/2014 67.2 Osteopenia -2.0 0.703 g/cm2  She is taking alendronate 70 mg weekly for the past 2 to 3 months  We discussed risk of osteoporotic fracture, reducing risk of falls, her gait and balance and she does state that she has long history of right knee osteoarthritis, has been bone-on-bone for some time, has been told that she needs a total knee replacement, she manages pain with occasional ibuprofen or Tylenol.  She is doing physical therapy for pelvic floor PT from her gynecologist and she has prescribed her a lift for her shoe because she has lost height in her right leg and it is changing the way that she walks to have her knee pain and changes in her right leg.  She previously went to a orthopedic specialist who did some kind of gel injection and she noted no benefit after that.  She has not done any steroid injections and she is hesitant to go back to  a specialist until she is gotten through her 2 cataract surgeries in the next couple months.  She is open to seeing the specialist if they are able to review other options for her such as steroid injections, stem cell injections etc.  Patient Active Problem List   Diagnosis Date Noted  . Cystocele with rectocele 05/05/2019  . Post-menopause on HRT (hormone replacement therapy) 05/05/2019  . OP (osteoporosis) 01/15/2017  . Arthritis, degenerative 01/15/2017  . Climacteric 01/15/2017  .  Obesity (BMI 30.0-34.9) 05/02/2015  . Hypertension 12/06/2014  . Dyslipidemia 12/06/2014    Past Surgical History:  Procedure Laterality Date  . ABDOMINAL HYSTERECTOMY    . CHOLECYSTECTOMY    . DENTAL SURGERY    . DILATION AND CURETTAGE OF UTERUS    . KNEE SURGERY    . TONSILLECTOMY      Family History  Problem Relation Age of Onset  . Breast cancer Maternal Aunt 40  . Breast cancer Maternal Grandmother   . Cancer Maternal Grandmother   . Diabetes Mother   . Heart disease Father   . Hypertension Father   . Prostate cancer Father   . Cancer Father   . Vision loss Father   . Diabetes Brother   . Cancer Maternal Aunt   . Ovarian cancer Neg Hx   . Colon cancer Neg Hx     Social History   Tobacco Use  . Smoking status: Never Smoker  . Smokeless tobacco: Never Used  Vaping Use  . Vaping Use: Never used  Substance Use Topics  . Alcohol use: No    Alcohol/week: 0.0 standard drinks  . Drug use: No      Current Outpatient Medications:  .  alendronate (FOSAMAX) 70 MG tablet, Take by mouth., Disp: , Rfl:  .  Calcium Carbonate-Vit D-Min (CALTRATE 600+D PLUS MINERALS) 600-800 MG-UNIT CHEW, Chew by mouth daily. , Disp: , Rfl:  .  estradiol (ESTRACE) 0.5 MG tablet, Take 1 tablet (0.5 mg total) by mouth daily., Disp: 90 tablet, Rfl: 3 .  MULTIPLE VITAMINS-MINERALS PO, Take 1 tablet by mouth daily., Disp: , Rfl:  .  olmesartan (BENICAR) 20 MG tablet, Take 1 tablet (20 mg total) by mouth daily., Disp: 90 tablet, Rfl: 1 .  UNABLE TO FIND, daily. Balance of Nature, Disp: , Rfl:   Allergies  Allergen Reactions  . Codeine   . Penicillins     Chart Review Today: I personally reviewed active problem list, medication list, allergies, family history, social history, health maintenance, notes from last encounter, lab results, imaging with the patient/caregiver today.   Review of Systems  10 Systems reviewed and are negative for acute change except as noted in the  HPI.  Objective:    Vitals:   10/31/19 1532  BP: 118/76  Pulse: 81  Resp: 14  Temp: 98.2 F (36.8 C)  SpO2: 98%  Weight: 144 lb 9.6 oz (65.6 kg)  Height: 4\' 11"  (1.499 m)    Body mass index is 29.21 kg/m.  Physical Exam Vitals and nursing note reviewed.  Constitutional:      General: She is not in acute distress.    Appearance: Normal appearance. She is not ill-appearing or toxic-appearing.  Cardiovascular:     Rate and Rhythm: Normal rate and regular rhythm.     Pulses: Normal pulses.     Heart sounds: Normal heart sounds.  Pulmonary:     Effort: Pulmonary effort is normal. No respiratory distress.     Breath sounds: Normal  breath sounds.  Musculoskeletal:     Right lower leg: Edema present.     Left lower leg: Edema present.  Skin:    Coloration: Skin is not jaundiced or pale.  Neurological:     Mental Status: She is alert.     Gait: Gait abnormal.  Psychiatric:        Mood and Affect: Mood normal.        Behavior: Behavior normal.       PHQ2/9: Depression screen [facility] 2/9 10/31/2019 05/17/2019 04/28/2019 06/18/2017 04/24/2017  Decreased Interest 0 1 0 0 0  Down, Depressed, Hopeless 0 1 0 0 0  PHQ - 2 Score 0 2 0 0 0  Altered sleeping 0 0 0 - -  Tired, decreased energy 0 0 0 - -  Change in appetite 0 0 0 - -  Feeling bad or failure about yourself  0 0 0 - -  Trouble concentrating 0 0 0 - -  Moving slowly or fidgety/restless 0 0 0 - -  Suicidal thoughts 0 0 0 - -  PHQ-9 Score 0 2 0 - -  Difficult doing work/chores Not difficult at all Not difficult at all Not difficult at all - -    phq 9 is neg, reviewed  Fall Risk: Fall Risk  10/31/2019 05/17/2019 04/28/2019 06/24/2018 06/18/2017  Falls in the past year? 1 0 0 0 No  Number falls in past yr: 0 0 0 0 -  Injury with Fall? 0 0 0 0 -  Risk for fall due to : - No Fall Risks - - -  Follow up - Falls prevention discussed Falls evaluation completed - -    Functional Status Survey: Is the patient deaf or have  difficulty hearing?: No Does the patient have difficulty seeing, even when wearing glasses/contacts?: No Does the patient have difficulty concentrating, remembering, or making decisions?: No Does the patient have difficulty walking or climbing stairs?: No Does the patient have difficulty dressing or bathing?: No Does the patient have difficulty doing errands alone such as visiting a doctor's office or shopping?: No   Assessment & Plan:   1. Essential hypertension Well-controlled and stable with Benicar, she does have soft BPs frequently her average is low end of normal.  With her osteoporosis and osteoporotic fracture risk do feel that it would be appropriate to decrease her Benicar dose and make sure that her blood pressure is not too low or does not cause any lightheaded episodes or falls.  I discussed this with her at length and she would like to try and decrease meds or D/C meds if able. We reviewed her medicines and available prescriptions, she did just pick up a bottle of the 20 mg dose, she will cut this in half and take 10 mg of Benicar daily for the next month or so and monitor her blood pressures, encouraged her to reach out to me if she is staying at the 10 mg dose or would like me to send in a 5 mg tablet dose for her to try as well.  Feel that she will be well controlled for age if she remains between 100/60 to 130/80  - COMPLETE METABOLIC PANEL WITH GFR  2. Osteoporosis without current pathological fracture, unspecified osteoporosis type She has had a diagnosis of osteoporosis for over 5 years but only recently started Fosamax weekly, she is taking as prescribed careful to sit upright for more than 60 minutes and drink a full glass of water.  She has not had any side effects and denies any dysphagia, she is very anxious about this however due to history of chronic cough that she was concerned would worsen with the medicine.  She is on calcium and vitamin D supplement daily We  discussed minimizing risk of falls including addressing muscle skeletal/gait, mobility symptoms or issues and she reported that she has had bone-on-bone right knee osteoarthritis for some time that is worsening, she is working with a physical therapist who is help to address this with a lift for her right foot but with years of chronic knee pain, bone-on-bone she is changing the way that she is walking her gait is changing.  He is managing chronic knee pain fairly well with only occasional use of Tylenol or ibuprofen. - COMPLETE METABOLIC PANEL WITH GFR - VITAMIN D 25 Hydroxy (Vit-D Deficiency, Fractures)  3. Osteoarthritis of right knee, unspecified osteoarthritis type Several years history right knee pain with bone-on-bone previously went to a specialist at [facility] clinic, she is interested in other treatments or therapies but does want a wait a few months until she is done with 2 different eye procedures. I did explain limitations of medical management of OA- its simply helping her tolerate the pain - but ortho would be able to do injections, I'm unsure if "stem cell" injections are available for her. She will let us know when she is ready for the referral.  4. Encounter for medication monitoring Ensure Vit D and calcium are at adequate levels for alendronate tx - COMPLETE METABOLIC PANEL WITH GFR - VITAMIN D 25 Hydroxy (Vit-D Deficiency, Fractures)  Lengthy discussion today about plan to change meds and f/up for HTN, reviewed meds, dosing, supplements, fall risk, fall prevention today for osteoporosis and fosamax med monitoring, and discussed right knee OA with change in gait, total time today spent with pt was greater than 25 min, with OV total time >40 total.  Return for 6 month f/up on HTN/osteoporosis/knee pain - come sooner i.   [name], PA-C 10/31/19 3:35 PM

## 2019-11-01 MED ORDER — VITAMIN D (ERGOCALCIFEROL) 1.25 MG (50000 UNIT) PO CAPS: 50000.0000 [IU] | ORAL_CAPSULE | ORAL | 0 refills | Status: DC

## 2019-11-01 NOTE — Addendum Note (Signed)
Addended by: [name] on: 11/01/2019 05:51 PM   Modules accepted: Orders

## 2019-11-02 ENCOUNTER — Other Ambulatory Visit: Payer: Self-pay

## 2019-11-02 ENCOUNTER — Ambulatory Visit: Payer: Medicare Other | Admitting: Physical Therapy

## 2019-11-02 DIAGNOSIS — G8929 Other chronic pain: Secondary | ICD-10-CM

## 2019-11-02 DIAGNOSIS — R2689 Other abnormalities of gait and mobility: Secondary | ICD-10-CM | POA: Diagnosis not present

## 2019-11-02 DIAGNOSIS — M6281 Muscle weakness (generalized): Secondary | ICD-10-CM

## 2019-11-02 NOTE — Therapy (Signed)
[place] [facility] MAIN [facility] SERVICES [address] [place], [place], [address] Phone: [phone]   Fax:  [phone]  Physical Therapy Treatment  Patient Details  Name: Zoe Alvarado MRN: [id number] Date of Birth: 1946/02/15 Referring Provider (PT): Hildred Laser    Encounter Date: 11/02/2019   PT End of Session - 11/02/19 1227    Visit Number 2    Number of Visits 10    Date for PT Re-Evaluation 01/04/20   eval 10/26/19   PT Start Time 1003    PT Stop Time 1104    PT Time Calculation (min) 61 min           Past Medical History:  Diagnosis Date  . Acute non-recurrent maxillary sinusitis 03/20/2016  . Allergy   . 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 Cataract years  . Hyperlipidemia   . Hypertension   . Osteoporosis   . Prolapse of female pelvic organs     Past Surgical History:  Procedure Laterality Date  . ABDOMINAL HYSTERECTOMY    . CHOLECYSTECTOMY    . DENTAL SURGERY    . DILATION AND CURETTAGE OF UTERUS    . KNEE SURGERY    . TONSILLECTOMY      There were no vitals filed for this visit.   Subjective Assessment - 11/02/19 1017    Subjective Pt reported her knee pain improved by 10-15% with shoe lift.    Pertinent History prolapse of pelvic organs, abdominal hysterectomy, D & C due to marriage, gall bladder removal, osteoporosis (report in records reviewed) , R knee pain, Hx of CA. No LBP. No dyspareunia.    Patient Stated Goals to avoid surgery and improve continence and improve R knee pain              [facility] PT Assessment - 11/02/19 1017      Observation/Other Assessments   Scoliosis no scoliotic curves, forward head , shoulder levelled        AROM   Overall AROM Comments hooklying 90-90 R position,  hip flex 90 due to knee pain, post Tx: full hip flexion range without knee pain        Palpation   SI assessment  iliac crest levelled,     Palpation comment tightness at QL/ glut med R tightness , R SIJ hypomobility                          [facility] Adult PT Treatment/Exercise - 11/02/19 1056      Therapeutic Activites    Other Therapeutic Activities explained lower kinetic chain relationship to pelvic floor issues      Neuro Re-ed    Neuro Re-ed Details  cued for proper mini squat and stretching hip abductors with her limitations       Manual Therapy   Manual therapy comments long axis distraction, rotational mob / hip abd / ER  on RLE , glut med R , AP mob Grade II-III FADDIR , FABDER                       PT Long Term Goals - 10/27/19 1543      PT LONG TERM GOAL #1   Title Pt will demo decreased time with 5 TST from 18 sec to < 17 sec in order to minimize falls    Time 4    Status New  PT LONG TERM GOAL #2   Title Pt will report increased stool consistency to Type 4 100% of the time and no straining with all bowel movements in order to minimize lowered position of pelvic organs and improve continence    Time 6    Period Weeks    Status New      PT LONG TERM GOAL #3   Title Pt will be able to climb stairs in her house safely with less knee pain by 50% in order to get to her 2nd floor safely    Time 4    Status New      PT LONG TERM GOAL #4   Title Pt will demo co-activation with pelvic floor in order to not leak with getting up from bed and sitting in order to improve continence    Time 6    Period Weeks    Status New      PT LONG TERM GOAL #5   Title Pt will be compliant with shoe lift wear and demo levelled iliac crest and less spinal deviations in order to progress to gait training and deep core strengthening to minimize urinary incontinence    Time 2    Period Weeks    Status New      Additional Long Term Goals   Additional Long Term Goals Yes      PT LONG TERM GOAL #6   Title Pt will increase FOTO prolapse pts 25 pts, Urinary 49 pts with increased points of > 8 pts in order to improve QOL    Time 10    Period Weeks    Status New      PT  LONG TERM GOAL #7   Title Pt will demo proper body mechanics to minimize straining pelvic floor and worsening prolapse    Time 2    Period Weeks    Status New    Target Date 11/10/19      PT LONG TERM GOAL #8   Title Pt will demo increased R LE strength from R knee flex/ext 3+/5 to > 4/5 in order to improve gait    Time 8    Period Weeks    Status New    Target Date 12/22/19                 Plan - 11/02/19 1228    Clinical Impression Statement Pt demo'd improvements with iliac crest and shoulder height, significantly less scoliotic curves after last session which included providing a shoe lift to her R shoe.This improved her R knee pain but she still needs more strengthening to further decrease her R knee pain. Added hip strengthening exercises today which will also help with pelvic stability and yield longer gains when pelvic floor Tx begins.  Further addressed R hip/ back mm tightness and SIJ hypomobility which helped pt achieve more hip flexion in supine 90-90 deg position with no knee pain after Tx. Pt was able to achieve hip flex/ abd/ER on R LE post Tx. Plan to address pelvic floor at next session.  Pt continues to benefit from skilled PT    Personal Factors and Comorbidities Comorbidity 3+    Comorbidities prolapse of pelvic organs, abdominal hysterectomy, D & C due to marriage, gall bladder removal, osteoporosis (report in records reviewed) , R knee pain, Hx of CA.    Examination-Activity Limitations Squat;Stairs;Stand;Toileting;Sit    Stability/Clinical Decision Making Evolving/Moderate complexity    Rehab Potential Good    PT Frequency  1x / week    PT Duration Other (comment)   10   PT Treatment/Interventions Therapeutic activities;Patient/family education;Therapeutic exercise;Neuromuscular re-education;Moist Heat;Scar mobilization;Balance training;Manual techniques;Splinting;Taping;Gait training;Functional mobility training           Patient will benefit from skilled  therapeutic intervention in order to improve the following deficits and impairments:  Decreased coordination, Decreased range of motion, Abnormal gait, Decreased endurance, Decreased safety awareness, Increased muscle spasms, Decreased balance, Decreased strength, Decreased mobility, Impaired sensation, Postural dysfunction, Improper body mechanics, Decreased scar mobility, Impaired flexibility, Hypomobility, Pain, Decreased knowledge of precautions  Visit Diagnosis: Other abnormalities of gait and mobility  Muscle weakness (generalized)  Chronic pain of right knee     Problem List Patient Active Problem List   Diagnosis Date Noted  . Cystocele with rectocele 05/05/2019  . Post-menopause on HRT (hormone replacement therapy) 05/05/2019  . OP (osteoporosis) 01/15/2017  . Arthritis, degenerative 01/15/2017  . Climacteric 01/15/2017  . Obesity (BMI 30.0-34.9) 05/02/2015  . Hypertension 12/06/2014  . Dyslipidemia 12/06/2014    Mariane Masters ,PT, DPT, E-RYT  11/02/2019, 12:29 PM  [place] [facility] MAIN [facility] SERVICES [address] [place], [place], [address] Phone: [phone]   Fax:  [phone]  Name: Zoe Alvarado MRN: [id number] Date of Birth: [birth year]

## 2019-11-02 NOTE — Patient Instructions (Signed)
  Clam Shell 45 Degrees   Lying with hips and knees bent 45, one pillow between knees and ankles. Heel in line with tailbone Elbow inline with hips, ahead of the shoulder, pushing through forearm at 45 deg with fist  Lift knee with exhale. Be sure pelvis does not roll backward. Do not arch back. Do 20 times, each leg, 2 times per day.   Sidestep mini squat at the counter L /R = 1 lap  10 laps      Complimentary stretch: Figure-4  Cross _ foot over _ thigh, opposite knee straight  3 breaths  * Keep pelvis levelled with tactile cue with hand under back of hips  * Slide the ankle of the supporting foot out to decrease the angle which can help level the pelvis    For  your R side, turn your body 45 deg, to prop the thigh on the chair for now due to limitations

## 2019-11-09 ENCOUNTER — Ambulatory Visit: Payer: Medicare Other | Admitting: Physical Therapy

## 2019-11-09 ENCOUNTER — Other Ambulatory Visit: Payer: Self-pay

## 2019-11-09 DIAGNOSIS — M6281 Muscle weakness (generalized): Secondary | ICD-10-CM

## 2019-11-09 DIAGNOSIS — R2689 Other abnormalities of gait and mobility: Secondary | ICD-10-CM

## 2019-11-09 NOTE — Patient Instructions (Signed)
Deep core level 1 and 2  

## 2019-11-09 NOTE — Therapy (Signed)
[place] [facility] MAIN [facility] SERVICES [address] [place], [place], [address] Phone: [phone]   Fax:  [phone]  Physical Therapy Treatment  Patient Details  Name: Zoe Alvarado MRN: [id number] Date of Birth: [birth year] Referring Provider (PT): Hildred Laser    Encounter Date: 11/09/2019   PT End of Session - 11/09/19 1102    Visit Number 3    Number of Visits 10    Date for PT Re-Evaluation 01/04/20   eval 10/26/19   PT Start Time 1000    PT Stop Time 1102    PT Time Calculation (min) 62 min    Activity Tolerance Patient tolerated treatment well           Past Medical History:  Diagnosis Date  . Acute non-recurrent maxillary sinusitis 03/20/2016  . Allergy   . 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 Cataract years  . Hyperlipidemia   . Hypertension   . Osteoporosis   . Prolapse of female pelvic organs     Past Surgical History:  Procedure Laterality Date  . ABDOMINAL HYSTERECTOMY    . CHOLECYSTECTOMY    . DENTAL SURGERY    . DILATION AND CURETTAGE OF UTERUS    . KNEE SURGERY    . TONSILLECTOMY      There were no vitals filed for this visit.   Subjective Assessment - 11/09/19 1009    Subjective Pt reported she had a set back. The day after last session, she went shopping and she had trouble walking around, more so in the past. Pt rested that day and day after and it seems better now.    Pertinent History prolapse of pelvic organs, abdominal hysterectomy, D & C due to marriage, gall bladder removal, osteoporosis (report in records reviewed) , R knee pain, Hx of CA. No LBP. No dyspareunia.    Patient Stated Goals to avoid surgery and improve continence and improve R knee pain              [facility] PT Assessment - 11/09/19 1012      Strength   Overall Strength Comments B hip flex/ knee flex  4-/5 , B knee ext 4+/5 B       Palpation   SI assessment  tightness at coccygeus, attachments at ischial rami R                        Pelvic Floor Special Questions - 11/09/19 1059    External Perineal Exam through clothing, bulbospongiosus, deep transverse perineal R ,  dyscoordination of pelvic floor with ab overuse               [facility] Adult PT Treatment/Exercise - 11/09/19 1100      Therapeutic Activites    Other Therapeutic Activities explained Tx for prolapse, plan for integration to use Curve machines later in progress       Neuro Re-ed    Neuro Re-ed Details  cued for deep core level 1 and 2, pelvic tilts , excessive cues for less ab overuse        Modalities   Modalities Moist Heat      Moist Heat Therapy   Number Minutes Moist Heat 5 Minutes    Moist Heat Location --   perineum      Manual Therapy   Manual therapy comments  long axis distraction of R LE,  STM/MWM  at probelm areas noted in  assessment of pelvic floor                        PT Long Term Goals - 10/27/19 1543      PT LONG TERM GOAL #1   Title Pt will demo decreased time with 5 TST from 18 sec to < 17 sec in order to minimize falls    Time 4    Status New      PT LONG TERM GOAL #2   Title Pt will report increased stool consistency to Type 4 100% of the time and no straining with all bowel movements in order to minimize lowered position of pelvic organs and improve continence    Time 6    Period Weeks    Status New      PT LONG TERM GOAL #3   Title Pt will be able to climb stairs in her house safely with less knee pain by 50% in order to get to her 2nd floor safely    Time 4    Status New      PT LONG TERM GOAL #4   Title Pt will demo co-activation with pelvic floor in order to not leak with getting up from bed and sitting in order to improve continence    Time 6    Period Weeks    Status New      PT LONG TERM GOAL #5   Title Pt will be compliant with shoe lift wear and demo levelled iliac crest and less spinal deviations in order to progress to gait training and deep core strengthening  to minimize urinary incontinence    Time 2    Period Weeks    Status New      Additional Long Term Goals   Additional Long Term Goals Yes      PT LONG TERM GOAL #6   Title Pt will increase FOTO prolapse pts 25 pts, Urinary 49 pts with increased points of > 8 pts in order to improve QOL    Time 10    Period Weeks    Status New      PT LONG TERM GOAL #7   Title Pt will demo proper body mechanics to minimize straining pelvic floor and worsening prolapse    Time 2    Period Weeks    Status New    Target Date 11/10/19      PT LONG TERM GOAL #8   Title Pt will demo increased R LE strength from R knee flex/ext 3+/5 to > 4/5 in order to improve gait    Time 8    Period Weeks    Status New    Target Date 12/22/19                 Plan - 11/09/19 1124    Clinical Impression Statement Pt showed tighter pelvic floor mm on R than L which is likely associated with her shorter leg that has been adjusted with a shoe lift. Pt tolerated manual Tx to release these mm and required excessive cues for less abdominal overuse with deep core coordination. Pt benefited from a pillow under her hips given Hx of prolapse. Plan to perform internal pelvic floor at next session. Pt benefits from skilled PT    Personal Factors and Comorbidities Comorbidity 3+    Comorbidities prolapse of pelvic organs, abdominal hysterectomy, D & C due to marriage, gall bladder removal, osteoporosis (report in records reviewed) ,  R knee pain, Hx of CA.    Examination-Activity Limitations Squat;Stairs;Stand;Toileting;Sit    Stability/Clinical Decision Making Evolving/Moderate complexity    Rehab Potential Good    PT Frequency 1x / week    PT Duration Other (comment)   10   PT Treatment/Interventions Therapeutic activities;Patient/family education;Therapeutic exercise;Neuromuscular re-education;Moist Heat;Scar mobilization;Balance training;Manual techniques;Splinting;Taping;Gait training;Functional mobility training            Patient will benefit from skilled therapeutic intervention in order to improve the following deficits and impairments:  Decreased coordination, Decreased range of motion, Abnormal gait, Decreased endurance, Decreased safety awareness, Increased muscle spasms, Decreased balance, Decreased strength, Decreased mobility, Impaired sensation, Postural dysfunction, Improper body mechanics, Decreased scar mobility, Impaired flexibility, Hypomobility, Pain, Decreased knowledge of precautions  Visit Diagnosis: Other abnormalities of gait and mobility  Muscle weakness (generalized)     Problem List Patient Active Problem List   Diagnosis Date Noted  . Cystocele with rectocele 05/05/2019  . Post-menopause on HRT (hormone replacement therapy) 05/05/2019  . OP (osteoporosis) 01/15/2017  . Arthritis, degenerative 01/15/2017  . Climacteric 01/15/2017  . Obesity (BMI 30.0-34.9) 05/02/2015  . Hypertension 12/06/2014  . Dyslipidemia 12/06/2014    Mariane Masters ,PT, DPT, E-RYT  11/09/2019, 11:24 AM  [place] [facility] MAIN [facility] SERVICES [address] [place], [place], [address] Phone: [phone]   Fax:  [phone]  Name: Zoe Alvarado MRN: [id number] Date of Birth: 06-07-1945

## 2019-11-15 ENCOUNTER — Encounter: Payer: Self-pay | Admitting: Ophthalmology

## 2019-11-16 ENCOUNTER — Ambulatory Visit: Payer: Medicare Other | Admitting: Physical Therapy

## 2019-11-16 ENCOUNTER — Other Ambulatory Visit: Payer: Self-pay

## 2019-11-16 DIAGNOSIS — G8929 Other chronic pain: Secondary | ICD-10-CM

## 2019-11-16 DIAGNOSIS — M6281 Muscle weakness (generalized): Secondary | ICD-10-CM

## 2019-11-16 DIAGNOSIS — R2689 Other abnormalities of gait and mobility: Secondary | ICD-10-CM

## 2019-11-16 NOTE — Therapy (Signed)
[place] [facility] MAIN [facility] SERVICES [address] [place], [place], [address] Phone: [phone]   Fax:  [phone]  Physical Therapy Treatment  Patient Details  Name: Zoe Alvarado MRN: [id number] Date of Birth: 09/27/1945 Referring Provider (PT): Hildred Laser    Encounter Date: 11/16/2019   PT End of Session - 11/16/19 1233    Visit Number 4    Number of Visits 10    Date for PT Re-Evaluation 01/04/20   eval 10/26/19   PT Start Time 1000    PT Stop Time 1100    PT Time Calculation (min) 60 min    Activity Tolerance Patient tolerated treatment well           Past Medical History:  Diagnosis Date  . Acute non-recurrent maxillary sinusitis 03/20/2016  . Allergy   . 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 Cataract years  . Hyperlipidemia   . Hypertension   . Osteoporosis   . Pneumonia   . Prolapse of female pelvic organs     Past Surgical History:  Procedure Laterality Date  . ABDOMINAL HYSTERECTOMY    . CHOLECYSTECTOMY    . DENTAL SURGERY     dental implants  . DILATION AND CURETTAGE OF UTERUS    . KNEE SURGERY     arthroscopy  . TONSILLECTOMY      There were no vitals filed for this visit.   Subjective Assessment - 11/16/19 1006    Subjective Pt reported she went to [employer] this week twice and she noticed her knees did not bother her as much as her previous trip there the week before last Tx. Pt notices less leakage during the day. Leakage still occurs when she gets up at night. Pt has difficulty bending her R knee with stairs.    Pertinent History prolapse of pelvic organs, abdominal hysterectomy, D & C due to marriage, gall bladder removal, osteoporosis (report in records reviewed) , R knee pain, Hx of CA. No LBP. No dyspareunia.    Patient Stated Goals to avoid surgery and improve continence and improve R knee pain              [facility] PT Assessment - 11/16/19 1009      AROM   Overall AROM Comments supine: knee flexion  65 deg L, 90 deg R with painful end range       Strength   Overall Strength Comments B hip flex/ knee flex 4+/5 , B knee ext 4+/5 B, single UE on wall, PF MMT 2/5  16 reps R ( Post Tx: 21reps)   3/5   20 reps L ( psot Tx 20 reps) .  DF/EV 3+/5 L, 4+/5 R         Palpation   SI assessment  tightness at coccygeus R, SIJ L      Palpation comment R kne swelling, tenderness at Medial aspect of knee       Ambulation/Gait   Stairs --   narrow BOS , step to pattern                         [facility] Adult PT Treatment/Exercise - 11/16/19 1048      Therapeutic Activites    Other Therapeutic Activities stairs training       Neuro Re-ed    Neuro Re-ed Details  cued for wider BOS on ascending stairs       Exercises  Exercises --   see pt instructions       Manual Therapy   Manual therapy comments STM at medial aspect of knee, STM/MWM at coccygeus L , FADDIR PA mob to increased SIJ mobility     Kinesiotex Edema;Facilitate Muscle   medial aspect of knee                      PT Long Term Goals - 10/27/19 1543      PT LONG TERM GOAL #1   Title Pt will demo decreased time with 5 TST from 18 sec to < 17 sec in order to minimize falls    Time 4    Status New      PT LONG TERM GOAL #2   Title Pt will report increased stool consistency to Type 4 100% of the time and no straining with all bowel movements in order to minimize lowered position of pelvic organs and improve continence    Time 6    Period Weeks    Status New      PT LONG TERM GOAL #3   Title Pt will be able to climb stairs in her house safely with less knee pain by 50% in order to get to her 2nd floor safely    Time 4    Status New      PT LONG TERM GOAL #4   Title Pt will demo co-activation with pelvic floor in order to not leak with getting up from bed and sitting in order to improve continence    Time 6    Period Weeks    Status New      PT LONG TERM GOAL #5   Title Pt will be compliant with shoe  lift wear and demo levelled iliac crest and less spinal deviations in order to progress to gait training and deep core strengthening to minimize urinary incontinence    Time 2    Period Weeks    Status New      Additional Long Term Goals   Additional Long Term Goals Yes      PT LONG TERM GOAL #6   Title Pt will increase FOTO prolapse pts 25 pts, Urinary 49 pts with increased points of > 8 pts in order to improve QOL    Time 10    Period Weeks    Status New      PT LONG TERM GOAL #7   Title Pt will demo proper body mechanics to minimize straining pelvic floor and worsening prolapse    Time 2    Period Weeks    Status New    Target Date 11/10/19      PT LONG TERM GOAL #8   Title Pt will demo increased R LE strength from R knee flex/ext 3+/5 to > 4/5 in order to improve gait    Time 8    Period Weeks    Status New    Target Date 12/22/19                 Plan - 11/16/19 1233    Clinical Impression Statement Pt's knee is improving as pt reported she was able to walk in [employer] twice last week with less pain compared to the previous week. Pt also notices less urinary leakage during the day.   Pt tolerated manual Tx at R medial knee and kinesiotaping was applied to provide more support / reduce edema. Pt reported less pain at R  knee post Tx. Pt required L DF/EV strengthening due to weakness associated with L longer leg which is now adjusted with shoe lift in R shoe.  Plan to improve L genu valgus at next session and perform pelvic floor assessment at another session.    Pt continues to benefit from skilled PT     Personal Factors and Comorbidities Comorbidity 3+    Comorbidities prolapse of pelvic organs, abdominal hysterectomy, D & C due to marriage, gall bladder removal, osteoporosis (report in records reviewed) , R knee pain, Hx of CA.    Examination-Activity Limitations Squat;Stairs;Stand;Toileting;Sit    Stability/Clinical Decision Making Evolving/Moderate complexity     Rehab Potential Good    PT Frequency 1x / week    PT Duration Other (comment)   10   PT Treatment/Interventions Therapeutic activities;Patient/family education;Therapeutic exercise;Neuromuscular re-education;Moist Heat;Scar mobilization;Balance training;Manual techniques;Splinting;Taping;Gait training;Functional mobility training           Patient will benefit from skilled therapeutic intervention in order to improve the following deficits and impairments:  Decreased coordination, Decreased range of motion, Abnormal gait, Decreased endurance, Decreased safety awareness, Increased muscle spasms, Decreased balance, Decreased strength, Decreased mobility, Impaired sensation, Postural dysfunction, Improper body mechanics, Decreased scar mobility, Impaired flexibility, Hypomobility, Pain, Decreased knowledge of precautions  Visit Diagnosis: Other abnormalities of gait and mobility  Muscle weakness (generalized)  Chronic pain of right knee     Problem List Patient Active Problem List   Diagnosis Date Noted  . Cystocele with rectocele 05/05/2019  . Post-menopause on HRT (hormone replacement therapy) 05/05/2019  . OP (osteoporosis) 01/15/2017  . Arthritis, degenerative 01/15/2017  . Climacteric 01/15/2017  . Obesity (BMI 30.0-34.9) 05/02/2015  . Hypertension 12/06/2014  . Dyslipidemia 12/06/2014    Mariane Masters ,PT, DPT, E-RYT t 11/16/2019, 12:38 PM   [facility] MAIN [facility] SERVICES [address] [place], [place], [address] Phone: [phone]   Fax:  [phone]  Name: CLYDINE PARKISON MRN: [id number] Date of Birth: 12-04-1945

## 2019-11-16 NOTE — Patient Instructions (Addendum)
Ankle strengthening on L with band band wrapped around outer L side of foot ballmound of L foot pressing onto band , R foot is placed on top of band hip width apart, with the ballmound ,  R hand holds the band 15 reps swinging L pinky toe out to the L  X 2x day  

## 2019-11-22 ENCOUNTER — Other Ambulatory Visit: Admission: RE | Admit: 2019-11-22 | Payer: Medicare Other | Source: Ambulatory Visit

## 2019-11-23 ENCOUNTER — Ambulatory Visit: Payer: Medicare Other | Attending: Obstetrics and Gynecology | Admitting: Physical Therapy

## 2019-11-23 ENCOUNTER — Other Ambulatory Visit: Payer: Self-pay

## 2019-11-23 ENCOUNTER — Encounter: Payer: Self-pay | Admitting: Ophthalmology

## 2019-11-23 DIAGNOSIS — G8929 Other chronic pain: Secondary | ICD-10-CM

## 2019-11-23 DIAGNOSIS — R2689 Other abnormalities of gait and mobility: Secondary | ICD-10-CM

## 2019-11-23 DIAGNOSIS — M25561 Pain in right knee: Secondary | ICD-10-CM | POA: Diagnosis present

## 2019-11-23 DIAGNOSIS — M6281 Muscle weakness (generalized): Secondary | ICD-10-CM

## 2019-11-23 NOTE — Therapy (Addendum)
[place] MAIN [facility] SERVICES [address] [place], [place], [address] Phone: [phone]   Fax:  [phone]  Physical Therapy Treatment / Progress note reporting from 11/15/19 to 11/23/19 across 5 visits  Patient Details  Name: Zoe Alvarado MRN: [id number] Date of Birth: 1945-12-02 Referring Provider (PT): Rubie Maid    Encounter Date: 11/23/2019   PT End of Session - 11/23/19 1014    Visit Number 5    Number of Visits    Date for PT Re-Evaluation 02/15/20    PT Start Time 1007    PT Stop Time 1100    PT Time Calculation (min) 53 min    Activity Tolerance Patient tolerated treatment well           Past Medical History:  Diagnosis Date  . Acute non-recurrent maxillary sinusitis 03/20/2016  . Allergy   . 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 Cataract years  . Hyperlipidemia   . Hypertension   . Osteoporosis   . Pneumonia   . Prolapse of female pelvic organs     Past Surgical History:  Procedure Laterality Date  . ABDOMINAL HYSTERECTOMY    . CHOLECYSTECTOMY    . DENTAL SURGERY     dental implants  . DILATION AND CURETTAGE OF UTERUS    . KNEE SURGERY     arthroscopy  . TONSILLECTOMY      There were no vitals filed for this visit.   Subjective Assessment - 11/23/19 1012    Subjective Pt noticed she has had a pain in her L hip a couple of days after last session. It did go away.  The R knee pain decreased from 6-7/10 to 3/10 now. Pt is able to walk more in the stores.    Pertinent History prolapse of pelvic organs, abdominal hysterectomy, D & C due to marriage, gall bladder removal, osteoporosis (report in records reviewed) , R knee pain, Hx of CA. No LBP. No dyspareunia.    Patient Stated Goals to avoid surgery and improve continence and improve R knee pain              [facility] PT Assessment - 11/23/19 1017      Strength   Overall Strength Comments B hip flex/ knee flex 4+/5 , B knee ext 4+/5 B, single UE on wall, PF  MMT 3/5  20 reps B,  DF/EV 4/5 B       Palpation   SI assessment  L SIJ hypomobility, glut med tightness, medial plateau of L knee hypomobile       Ambulation/Gait   Gait velocity 1.25 m/s     Gait Comments wider BOS, short stride, minimal hip flexion                          [facility] Adult PT Treatment/Exercise - 11/23/19 1017      Neuro Re-ed    Neuro Re-ed Details  cued for more transverse arches, intrinsic feet mm, alignment with pleat exercise       Manual Therapy   Manual therapy comments AP mob, distraction at L SIJ, STM at glut med, AP/PA mob at medial plateau of L knee       Kinesiotex Facilitate Muscle                  PT Long Term Goals - 12/06/19 1525      PT LONG TERM GOAL #  1   Title Pt will demo decreased time with 5 TST from 18 sec to < 17 sec in order to minimize falls    Time 4    Status On-going      PT LONG TERM GOAL #2   Title Pt will report increased stool consistency to Type 4 100% of the time and no straining with all bowel movements in order to minimize lowered position of pelvic organs and improve continence    Time 6    Period Weeks    Status Partially Met      PT LONG TERM GOAL #3   Title Pt will be able to climb stairs in her house safely with less knee pain by 50% in order to get to her 2nd floor safely  ( 7/7  33%)    Time 4    Status Partially Met      PT LONG TERM GOAL #4   Title Pt will demo co-activation with pelvic floor in order to not leak with getting up from bed and sitting in order to improve continence    Time 6    Period Weeks    Status Partially Met      PT LONG TERM GOAL #5   Title Pt will be compliant with shoe lift wear and demo levelled iliac crest and less spinal deviations in order to progress to gait training and deep core strengthening to minimize urinary incontinence    Time 2    Period Weeks    Status Achieved      PT LONG TERM GOAL #6   Title Pt will increase FOTO prolapse pts 25 pts, Urinary  49 pts with increased points of > 8 pts in order to improve QOL ( 7/20: 21 pts of change for both )    Time 10    Period Weeks    Status Achieved      PT LONG TERM GOAL #7   Title Pt will demo proper body mechanics to minimize straining pelvic floor and worsening prolapse    Time 2    Period Weeks    Status Achieved      PT LONG TERM GOAL #8   Title Pt will demo increased R LE strength from R knee flex/ext 3+/5 to > 4/5 in order to improve gait    Time 8    Period Weeks    Status On-going                     Plan - 11/23/19 1153    Clinical Impression Statement Pt has achieved 3/8 goals, partially met 3 goals, and progressing well towards remaining goals. Pt's pelvic girdle and spinal deviations have been realigned with a shoe lift. Pt also showed improved deep core coordination which has improved her IAP system and in turn improved her urinary leakage and prolapse Sx. Pt's FOTO scores decreased significantly for Prolapse and Urinary from 25 pts for both categories to 4 pts which indicates improved function.   Pt 's R knee pain continues to have decreased. Pt's gait speed is improving and pt is able to shop more with less knee pain. Addressed L hip/ knee hypomobility as it was the side of the longer leg before a shoe lift was provided to her R foot. Pt demo'd incerased SIJ mobility on L and at medial aspect of L knee. Introduced lower kinetic chain chain propioception and strengthening training for which pt requried cues. Pt demo'd proper technique  post training. Anticipate today's Tx and new HEP will help pt minimize genu valgus and strengthen intrinsic feet mm for propulsion in gait and with stairs. Pt's PF reps are increasing as well which will help with stairs. Pt continues to report leakage is less during the day. Plan to return to pelvic floro strengtehnign after lower kinetic chain has been addressed.  Pt will need to hold PT while undergoing eye surgeries. Pt plans to  return end of August to progress towards remaining goals.     Pt continues to benefit from skilled PT    Personal Factors and Comorbidities Comorbidity 3+    Comorbidities prolapse of pelvic organs, abdominal hysterectomy, D & C due to marriage, gall bladder removal, osteoporosis (report in records reviewed) , R knee pain, Hx of CA.    Examination-Activity Limitations Squat;Stairs;Stand;Toileting;Sit    Stability/Clinical Decision Making Evolving/Moderate complexity    Rehab Potential Good    PT Frequency 1x / week    PT Duration Other (comment)   10   PT Treatment/Interventions Therapeutic activities;Patient/family education;Therapeutic exercise;Neuromuscular re-education;Moist Heat;Scar mobilization;Balance training;Manual techniques;Splinting;Taping;Gait training;Functional mobility training           Patient will benefit from skilled therapeutic intervention in order to improve the following deficits and impairments:  Decreased coordination, Decreased range of motion, Abnormal gait, Decreased endurance, Decreased safety awareness, Increased muscle spasms, Decreased balance, Decreased strength, Decreased mobility, Impaired sensation, Postural dysfunction, Improper body mechanics, Decreased scar mobility, Impaired flexibility, Hypomobility, Pain, Decreased knowledge of precautions  Visit Diagnosis: Other abnormalities of gait and mobility  Muscle weakness (generalized)  Chronic pain of right knee     Problem List Patient Active Problem List   Diagnosis Date Noted  . Cystocele with rectocele 05/05/2019  . Post-menopause on HRT (hormone replacement therapy) 05/05/2019  . OP (osteoporosis) 01/15/2017  . Arthritis, degenerative 01/15/2017  . Climacteric 01/15/2017  . Obesity (BMI 30.0-34.9) 05/02/2015  . Hypertension 12/06/2014  . Dyslipidemia 12/06/2014    Jerl Mina ,PT, DPT, E-RYT  11/23/2019, 11:57 AM  [place] MAIN [facility]  SERVICES [address] [place], [place], [address] Phone: [phone]   Fax:  [phone]  Name: Zoe Alvarado MRN: [id number] Date of Birth: 1946/04/26

## 2019-11-23 NOTE — Patient Instructions (Signed)
  PLEATS: BALLERINA Hands on counter  Barefoot   Heel raises in ballerina position, pressing rolled towel between heels  Hands at a corner of a wall   Knees bent pointed out like a "v" , navel ( center of mass) more forward  Heels together as you lift, pointed out like a "v"  KNEES ARE ALIGNED BEHIND THE TOES TO MINIMIZE STRAIN ON THE KNEES Lower heels down slow with your  navel ( center of mass) more forward to avoid dropping down fast and rocking more weight back onto heels   10 reps   ___   Walking with higher knees

## 2019-11-24 ENCOUNTER — Ambulatory Visit: Payer: Medicare Other | Admitting: Certified Registered Nurse Anesthetist

## 2019-11-24 ENCOUNTER — Encounter
Admission: RE | Disposition: A | Discharge status: Home / Self Care | Payer: Self-pay | Source: Home / Self Care | Attending: Ophthalmology

## 2019-11-24 ENCOUNTER — Encounter: Payer: Self-pay | Admitting: Ophthalmology

## 2019-11-24 ENCOUNTER — Ambulatory Visit
Admission: RE | Admit: 2019-11-24 | Discharge: 2019-11-24 | Disposition: A | Discharge status: Home / Self Care | Payer: Medicare Other | Attending: Ophthalmology | Admitting: Ophthalmology

## 2019-11-24 DIAGNOSIS — Z79899 Other long term (current) drug therapy: Secondary | ICD-10-CM | POA: Insufficient documentation

## 2019-11-24 DIAGNOSIS — H2512 Age-related nuclear cataract, left eye: Secondary | ICD-10-CM | POA: Diagnosis not present

## 2019-11-24 DIAGNOSIS — Z885 Allergy status to narcotic agent status: Secondary | ICD-10-CM | POA: Diagnosis not present

## 2019-11-24 DIAGNOSIS — I10 Essential (primary) hypertension: Secondary | ICD-10-CM | POA: Diagnosis not present

## 2019-11-24 DIAGNOSIS — M810 Age-related osteoporosis without current pathological fracture: Secondary | ICD-10-CM | POA: Insufficient documentation

## 2019-11-24 DIAGNOSIS — M1990 Unspecified osteoarthritis, unspecified site: Secondary | ICD-10-CM | POA: Insufficient documentation

## 2019-11-24 DIAGNOSIS — Z79890 Hormone replacement therapy (postmenopausal): Secondary | ICD-10-CM | POA: Insufficient documentation

## 2019-11-24 DIAGNOSIS — Z880 Allergy status to penicillin: Secondary | ICD-10-CM | POA: Diagnosis not present

## 2019-11-24 DIAGNOSIS — Z7983 Long term (current) use of bisphosphonates: Secondary | ICD-10-CM | POA: Insufficient documentation

## 2019-11-24 HISTORY — DX: Pneumonia, unspecified organism: J18.9

## 2019-11-24 HISTORY — PX: CATARACT EXTRACTION W/PHACO: SHX586

## 2019-11-24 SURGERY — PHACOEMULSIFICATION, CATARACT, WITH IOL INSERTION
Anesthesia: Monitor Anesthesia Care | Site: Eye | Laterality: Left

## 2019-11-24 MED ORDER — NEOMYCIN-POLYMYXIN-DEXAMETH 0.1 % OP OINT
TOPICAL_OINTMENT | OPHTHALMIC | Status: DC | PRN
  Administered 2019-11-24: 1 via OPHTHALMIC

## 2019-11-24 MED ORDER — EPINEPHRINE PF 1 MG/ML IJ SOLN
INTRAMUSCULAR | Status: AC
  Filled 2019-11-24: qty 1

## 2019-11-24 MED ORDER — NEOMYCIN-POLYMYXIN-DEXAMETH 3.5-10000-0.1 OP OINT
TOPICAL_OINTMENT | OPHTHALMIC | Status: AC
  Filled 2019-11-24: qty 3.5

## 2019-11-24 MED ORDER — FENTANYL CITRATE (PF) 100 MCG/2ML IJ SOLN
INTRAMUSCULAR | Status: AC
  Filled 2019-11-24: qty 2

## 2019-11-24 MED ORDER — PHENYLEPHRINE HCL (PRESSORS) 10 MG/ML IV SOLN
INTRAVENOUS | Status: AC
  Filled 2019-11-24: qty 1

## 2019-11-24 MED ORDER — POVIDONE-IODINE 5 % OP SOLN
OPHTHALMIC | Status: DC | PRN
  Administered 2019-11-24: 1 via OPHTHALMIC

## 2019-11-24 MED ORDER — ARMC OPHTHALMIC DILATING DROPS
1.0000 "application " | OPHTHALMIC | Status: AC
  Administered 2019-11-24 (×2): 1 via OPHTHALMIC

## 2019-11-24 MED ORDER — EPINEPHRINE PF 1 MG/ML IJ SOLN
INTRAOCULAR | Status: DC | PRN
  Administered 2019-11-24: 200 mL via OPHTHALMIC

## 2019-11-24 MED ORDER — TETRACAINE HCL 0.5 % OP SOLN
OPHTHALMIC | Status: AC
  Administered 2019-11-24: 1 [drp] via OPHTHALMIC
  Filled 2019-11-24: qty 4

## 2019-11-24 MED ORDER — SODIUM CHLORIDE 0.9 % IV SOLN: INTRAVENOUS | Status: DC

## 2019-11-24 MED ORDER — NA HYALUR & NA CHOND-NA HYALUR 0.4-0.35 ML IO KIT
PACK | INTRAOCULAR | Status: DC | PRN
  Administered 2019-11-24: .55 mL via INTRAOCULAR

## 2019-11-24 MED ORDER — MOXIFLOXACIN HCL 0.5 % OP SOLN
OPHTHALMIC | Status: AC
  Administered 2019-11-24: 1 [drp] via OPHTHALMIC
  Filled 2019-11-24: qty 6

## 2019-11-24 MED ORDER — MOXIFLOXACIN HCL 0.5 % OP SOLN
1.0000 [drp] | OPHTHALMIC | Status: DC | PRN
  Administered 2019-11-24: 1 [drp] via OPHTHALMIC

## 2019-11-24 MED ORDER — TETRACAINE HCL 0.5 % OP SOLN
1.0000 [drp] | OPHTHALMIC | Status: AC | PRN
  Administered 2019-11-24: 1 [drp] via OPHTHALMIC

## 2019-11-24 MED ORDER — LIDOCAINE HCL (PF) 4 % IJ SOLN
INTRAOCULAR | Status: DC | PRN
  Administered 2019-11-24: 4 mL via OPHTHALMIC

## 2019-11-24 MED ORDER — MIDAZOLAM HCL 2 MG/2ML IJ SOLN
INTRAMUSCULAR | Status: DC | PRN
  Administered 2019-11-24 (×2): 1 mg via INTRAVENOUS

## 2019-11-24 MED ORDER — ARMC OPHTHALMIC DILATING DROPS
OPHTHALMIC | Status: AC
  Administered 2019-11-24: 1 via OPHTHALMIC
  Filled 2019-11-24: qty 0.5

## 2019-11-24 MED ORDER — MOXIFLOXACIN HCL 0.5 % OP SOLN
1.0000 [drp] | OPHTHALMIC | Status: AC
  Administered 2019-11-24 (×2): 1 [drp] via OPHTHALMIC

## 2019-11-24 MED ORDER — NA CHONDROIT SULF-NA HYALURON 40-30 MG/ML IO SOLN
INTRAOCULAR | Status: DC | PRN
  Administered 2019-11-24: 0.5 mL via INTRAOCULAR

## 2019-11-24 MED ORDER — FENTANYL CITRATE (PF) 100 MCG/2ML IJ SOLN
INTRAMUSCULAR | Status: DC | PRN
  Administered 2019-11-24: 25 ug via INTRAVENOUS
  Administered 2019-11-24: 12.5 ug via INTRAVENOUS

## 2019-11-24 MED ORDER — POVIDONE-IODINE 5 % OP SOLN
OPHTHALMIC | Status: AC
  Filled 2019-11-24: qty 210

## 2019-11-24 MED ORDER — LIDOCAINE HCL (PF) 4 % IJ SOLN
INTRAMUSCULAR | Status: AC
  Filled 2019-11-24: qty 5

## 2019-11-24 MED ORDER — MIDAZOLAM HCL 2 MG/2ML IJ SOLN
INTRAMUSCULAR | Status: AC
  Filled 2019-11-24: qty 2

## 2019-11-24 SURGICAL SUPPLY — 21 items
GLOVE BIO SURGEON STRL SZ8 (GLOVE) ×2 IMPLANT
GLOVE BIOGEL M 6.5 STRL (GLOVE) ×2 IMPLANT
GLOVE SURG LX 7.5 STRW (GLOVE) ×1
GLOVE SURG LX STRL 7.5 STRW (GLOVE) ×1 IMPLANT
GOWN STRL REUS W/ TWL LRG LVL3 (GOWN DISPOSABLE) ×2 IMPLANT
GOWN STRL REUS W/TWL LRG LVL3 (GOWN DISPOSABLE) ×2
LABEL CATARACT MEDS ST (LABEL) ×2 IMPLANT
LENS IOL EYHANCE TORIC II 18.0 ×1 IMPLANT
LENS IOL EYHANCE TRC 225 18.0 ×1 IMPLANT
LENS IOL EYHNC TORIC 225 18.0 ×1 IMPLANT
MARKER SKIN DUAL TIP RULER LAB (MISCELLANEOUS) ×2 IMPLANT
NDL HPO THNWL 1X22GA REG BVL (NEEDLE) ×1 IMPLANT
NEEDLE SAFETY 22GX1 (NEEDLE) ×1
PACK CATARACT (MISCELLANEOUS) ×2 IMPLANT
PACK CATARACT BRASINGTON LX (MISCELLANEOUS) ×2 IMPLANT
PACK EYE AFTER SURG (MISCELLANEOUS) ×2 IMPLANT
SOL BSS BAG (MISCELLANEOUS) ×2
SOLUTION BSS BAG (MISCELLANEOUS) ×1 IMPLANT
SYR 5ML LL (SYRINGE) ×2 IMPLANT
WATER STERILE IRR 250ML POUR (IV SOLUTION) ×2 IMPLANT
WIPE NON LINTING 3.25X3.25 (MISCELLANEOUS) ×2 IMPLANT

## 2019-11-24 NOTE — Anesthesia Procedure Notes (Signed)
Date/Time: 11/24/2019 7:57 AM Performed by: Mathews Argyle, CRNA Pre-anesthesia Checklist: Patient identified, Emergency Drugs available, Suction available and Patient being monitored Oxygen Delivery Method: Nasal cannula

## 2019-11-24 NOTE — Anesthesia Postprocedure Evaluation (Signed)
Anesthesia Post Note  Patient: Zoe Alvarado  Procedure(s) Performed: CATARACT EXTRACTION PHACO AND INTRAOCULAR LENS PLACEMENT (IOC) LEFT EYHANCE TORIC (Left Eye)  Patient location during evaluation: Phase II Anesthesia Type: MAC Level of consciousness: awake and alert Pain management: pain level controlled Vital Signs Assessment: post-procedure vital signs reviewed and stable Respiratory status: spontaneous breathing, nonlabored ventilation and respiratory function stable Cardiovascular status: stable and blood pressure returned to baseline Postop Assessment: no apparent nausea or vomiting Anesthetic complications: no   No complications documented.   Last Vitals:  Vitals:   11/24/19 0619 11/24/19 0813  BP: 123/79 125/74  Pulse: 73 72  Resp: 16 16  Temp: 36.6 C 36.5 C  SpO2: 97% 99%    Last Pain:  Vitals:   11/24/19 0619  TempSrc: Temporal  PainSc: 4                  Rolla Plate P

## 2019-11-24 NOTE — Anesthesia Preprocedure Evaluation (Signed)
Anesthesia Evaluation  Patient identified by MRN, date of birth, ID band Patient awake    Reviewed: Allergy & Precautions, H&P , NPO status , Patient's Chart, lab work & pertinent test results  History of Anesthesia Complications Negative for: history of anesthetic complications  Airway Mallampati: II  TM Distance: >3 FB Neck ROM: full    Dental  (+) Implants, Teeth Intact   Pulmonary neg pulmonary ROS, neg shortness of breath, neg COPD,    Pulmonary exam normal        Cardiovascular hypertension, (-) angina(-) Past MI and (-) Cardiac Stents Normal cardiovascular exam(-) dysrhythmias      Neuro/Psych negative neurological ROS  negative psych ROS   GI/Hepatic negative GI ROS, Neg liver ROS,   Endo/Other  negative endocrine ROS  Renal/GU      Musculoskeletal   Abdominal   Peds  Hematology negative hematology ROS (+)   Anesthesia Other Findings Past Medical History: 03/20/2016: Acute non-recurrent maxillary sinusitis No date: Allergy No date: Arthritis     Comment:  in thumbs on both hands years: Cataract No date: Hyperlipidemia No date: Hypertension No date: Osteoporosis No date: Pneumonia No date: Prolapse of female pelvic organs  Past Surgical History: No date: ABDOMINAL HYSTERECTOMY No date: CHOLECYSTECTOMY No date: DENTAL SURGERY     Comment:  dental implants No date: DILATION AND CURETTAGE OF UTERUS No date: KNEE SURGERY     Comment:  arthroscopy No date: TONSILLECTOMY  BMI    Body Mass Index: 28.59 kg/m      Reproductive/Obstetrics negative OB ROS                             Anesthesia Physical Anesthesia Plan  ASA: II  Anesthesia Plan: MAC   Post-op Pain Management:    Induction:   PONV Risk Score and Plan:   Airway Management Planned: Natural Airway and Nasal Cannula  Additional Equipment:   Intra-op Plan:   Post-operative Plan:   Informed  Consent: I have reviewed the patients History and Physical, chart, labs and discussed the procedure including the risks, benefits and alternatives for the proposed anesthesia with the patient or authorized representative who has indicated his/her understanding and acceptance.       Plan Discussed with: Anesthesiologist and CRNA  Anesthesia Plan Comments:         Anesthesia Quick Evaluation

## 2019-11-24 NOTE — H&P (Signed)

## 2019-11-24 NOTE — Discharge Instructions (Addendum)
Eye Surgery Discharge Instructions  Expect mild scratchy sensation or mild soreness. DO NOT RUB YOUR EYE!  The day of surgery: . Minimal physical activity, but bed rest is not required . No reading, computer work, or close hand work . No bending, lifting, or straining. . May watch TV  For 24 hours: . No driving, legal decisions, or alcoholic beverages . Safety precautions . Eat anything you prefer: It is better to start with liquids, then soup then solid foods. . Solar shield eyeglasses should be worn for comfort in the sunlight/patch while sleeping  Resume all regular medications including aspirin or Coumadin if these were discontinued prior to surgery. You may shower, bathe, shave, or wash your hair. Tylenol may be taken for mild discomfort. Follow Dr. Skip Estimable eye drop instruction sheet as reviewed.  Call your doctor if you experience significant pain, nausea, or vomiting, fever > 101 or other signs of infection. [phone] or [phone] Specific instructions:   Follow-up Information    Lockie Mola, MD Follow up.   Specialty: Ophthalmology Why: 11/25/19 @ 9:00 am Contact information: [address]   [place] [address] [phone]

## 2019-11-24 NOTE — Transfer of Care (Signed)
Immediate Anesthesia Transfer of Care Note  Patient: Zoe Alvarado  Procedure(s) Performed: CATARACT EXTRACTION PHACO AND INTRAOCULAR LENS PLACEMENT (IOC) LEFT EYHANCE TORIC (Left Eye)  Patient Location: Short Stay  Anesthesia Type:MAC  Level of Consciousness: awake, alert  and oriented  Airway & Oxygen Therapy: Patient Spontanous Breathing  Post-op Assessment: Report given to RN and Post -op Vital signs reviewed and stable  Post vital signs: Reviewed  Last Vitals:  Vitals Value Taken Time  BP 125/74 11/24/19 0813  Temp 36.5 C 11/24/19 0813  Pulse 72 11/24/19 0813  Resp 16 11/24/19 0813  SpO2 99 % 11/24/19 0813    Last Pain:  Vitals:   11/24/19 0619  TempSrc: Temporal  PainSc: 4          Complications: No complications documented.

## 2019-11-24 NOTE — Op Note (Addendum)
  PREOPERATIVE DIAGNOSIS:  Nuclear sclerotic cataract of the left eye.  H25.12  POSTOPERATIVE DIAGNOSIS:  Nuclear sclerotic cataract of the left eye.   PROCEDURE:  Phacoemulsification with Toric posterior chamber intraocular lens placement of the left eye.  Ultrasound time: Procedure(s) with comments: CATARACT EXTRACTION PHACO AND INTRAOCULAR LENS PLACEMENT (IOC) LEFT EYHANCE TORIC (Left) - cde 16.31 [place] 1:39 ap 16.5  LENS:[id number] Eyhance 18.0 D Toric intraocular lens with 2.25 diopters of cylindrical power with axis orientation at 169 degrees.    SURGEON:  Deirdre Evener, MD   ANESTHESIA:  Topical with tetracaine drops and 2% Xylocaine jelly, augmented with 1% preservative-free intracameral lidocaine.  COMPLICATIONS:  None.   DESCRIPTION OF PROCEDURE:  The patient was identified in the holding room and transported to the operating suite and placed in the supine position under the operating microscope.  The left eye was identified as the operative eye, and it was prepped and draped in the usual sterile ophthalmic fashion.    A clear-corneal paracentesis incision was made at the 1:30 position.  0.5 ml of preservative-free 1% lidocaine was injected into the anterior chamber. The anterior chamber was filled with Viscoat.  A 2.4 millimeter near clear corneal incision was then made at the 10:30 position.  A cystotome and capsulorrhexis forceps were then used to make a curvilinear capsulorrhexis.  Hydrodissection and hydrodelineation were then performed using balanced salt solution.   Phacoemulsification was then used in stop and chop fashion to remove the lens, nucleus and epinucleus.  The remaining cortex was aspirated using the irrigation and aspiration handpiece.  Provisc viscoelastic was then placed into the capsular bag to distend it for lens placement.  The Verion digital marker was used to align the implant at the intended axis.   A 18.0 diopter lens was then injected into the  capsular bag.  It was rotated clockwise until the axis marks on the lens were approximately 15 degrees in the counterclockwise direction to the intended alignment.  The viscoelastic was aspirated from the eye using the irrigation aspiration handpiece.  Then, a Koch spatula through the sideport incision was used to rotate the lens in a clockwise direction until the axis markings of the intraocular lens were lined up with the Verion alignment.  Balanced salt solution was then used to hydrate the wounds. Miostat wasplaced into the anterior chamber. Vigamox 0.2 ml of a 1mg  per ml solution was injected into the anterior chamber for a dose of 0.2 mg of intracameral antibiotic at the completion of the case.    The eye was noted to have a physiologic pressure and there was no wound leak noted.   Maxitrol ointment was  applied to the eye.  The patient was taken to the recovery room in stable condition having had no complications of anesthesia or surgery.  Dellis Voght 11/24/2019, 8:11 AM

## 2019-11-25 ENCOUNTER — Encounter: Payer: Self-pay | Admitting: Ophthalmology

## 2019-11-30 ENCOUNTER — Ambulatory Visit: Payer: Medicare Other | Admitting: Physical Therapy

## 2019-12-06 ENCOUNTER — Encounter: Payer: Self-pay | Admitting: Ophthalmology

## 2019-12-06 NOTE — Addendum Note (Signed)
Addended by: Mariane Masters on: 12/06/2019 03:51 PM   Modules accepted: Orders

## 2019-12-07 ENCOUNTER — Ambulatory Visit: Payer: Medicare Other | Admitting: Physical Therapy

## 2019-12-12 ENCOUNTER — Other Ambulatory Visit: Payer: Self-pay

## 2019-12-12 ENCOUNTER — Other Ambulatory Visit
Admission: RE | Admit: 2019-12-12 | Discharge: 2019-12-12 | Disposition: A | Discharge status: Home / Self Care | Payer: Medicare Other | Source: Ambulatory Visit | Attending: Ophthalmology | Admitting: Ophthalmology

## 2019-12-12 DIAGNOSIS — Z01812 Encounter for preprocedural laboratory examination: Secondary | ICD-10-CM | POA: Diagnosis present

## 2019-12-12 DIAGNOSIS — Z20822 Contact with and (suspected) exposure to covid-19: Secondary | ICD-10-CM | POA: Diagnosis not present

## 2019-12-12 NOTE — Discharge Instructions (Signed)

## 2019-12-13 LAB — SARS CORONAVIRUS 2 (TAT 6-24 HRS): SARS Coronavirus 2: NEGATIVE

## 2019-12-14 ENCOUNTER — Other Ambulatory Visit: Payer: Self-pay

## 2019-12-14 ENCOUNTER — Encounter: Payer: Self-pay | Admitting: Ophthalmology

## 2019-12-14 ENCOUNTER — Ambulatory Visit
Admission: RE | Admit: 2019-12-14 | Discharge: 2019-12-14 | Disposition: A | Discharge status: Home / Self Care | Payer: Medicare Other | Attending: Ophthalmology | Admitting: Ophthalmology

## 2019-12-14 ENCOUNTER — Ambulatory Visit: Payer: Medicare Other | Admitting: Anesthesiology

## 2019-12-14 ENCOUNTER — Encounter
Admission: RE | Disposition: A | Discharge status: Home / Self Care | Payer: Self-pay | Source: Home / Self Care | Attending: Ophthalmology

## 2019-12-14 ENCOUNTER — Ambulatory Visit: Payer: Medicare Other | Admitting: Physical Therapy

## 2019-12-14 DIAGNOSIS — Z885 Allergy status to narcotic agent status: Secondary | ICD-10-CM | POA: Diagnosis not present

## 2019-12-14 DIAGNOSIS — Z880 Allergy status to penicillin: Secondary | ICD-10-CM | POA: Diagnosis not present

## 2019-12-14 DIAGNOSIS — Z9049 Acquired absence of other specified parts of digestive tract: Secondary | ICD-10-CM | POA: Diagnosis not present

## 2019-12-14 DIAGNOSIS — R42 Dizziness and giddiness: Secondary | ICD-10-CM | POA: Diagnosis not present

## 2019-12-14 DIAGNOSIS — M810 Age-related osteoporosis without current pathological fracture: Secondary | ICD-10-CM | POA: Insufficient documentation

## 2019-12-14 DIAGNOSIS — Z9849 Cataract extraction status, unspecified eye: Secondary | ICD-10-CM | POA: Diagnosis not present

## 2019-12-14 DIAGNOSIS — Z90710 Acquired absence of both cervix and uterus: Secondary | ICD-10-CM | POA: Diagnosis not present

## 2019-12-14 DIAGNOSIS — I10 Essential (primary) hypertension: Secondary | ICD-10-CM | POA: Diagnosis not present

## 2019-12-14 DIAGNOSIS — H2511 Age-related nuclear cataract, right eye: Secondary | ICD-10-CM | POA: Diagnosis present

## 2019-12-14 DIAGNOSIS — M1710 Unilateral primary osteoarthritis, unspecified knee: Secondary | ICD-10-CM | POA: Diagnosis not present

## 2019-12-14 DIAGNOSIS — R05 Cough: Secondary | ICD-10-CM | POA: Insufficient documentation

## 2019-12-14 HISTORY — PX: CATARACT EXTRACTION W/PHACO: SHX586

## 2019-12-14 SURGERY — PHACOEMULSIFICATION, CATARACT, WITH IOL INSERTION
Anesthesia: Monitor Anesthesia Care | Site: Eye | Laterality: Right

## 2019-12-14 MED ORDER — EPINEPHRINE PF 1 MG/ML IJ SOLN
INTRAOCULAR | Status: DC | PRN
  Administered 2019-12-14: 49 mL via OPHTHALMIC

## 2019-12-14 MED ORDER — LIDOCAINE HCL (PF) 2 % IJ SOLN
INTRAOCULAR | Status: DC | PRN
  Administered 2019-12-14: 2 mL

## 2019-12-14 MED ORDER — MOXIFLOXACIN HCL 0.5 % OP SOLN
1.0000 [drp] | OPHTHALMIC | Status: DC | PRN
  Administered 2019-12-14 (×3): 1 [drp] via OPHTHALMIC

## 2019-12-14 MED ORDER — ACETAMINOPHEN 160 MG/5ML PO SOLN: 325.0000 mg | ORAL | Status: DC | PRN

## 2019-12-14 MED ORDER — ACETAMINOPHEN 325 MG PO TABS: 325.0000 mg | ORAL_TABLET | ORAL | Status: DC | PRN

## 2019-12-14 MED ORDER — NA HYALUR & NA CHOND-NA HYALUR 0.4-0.35 ML IO KIT
PACK | INTRAOCULAR | Status: DC | PRN
  Administered 2019-12-14: 1 mL via INTRAOCULAR

## 2019-12-14 MED ORDER — TETRACAINE HCL 0.5 % OP SOLN
1.0000 [drp] | OPHTHALMIC | Status: DC | PRN
  Administered 2019-12-14 (×3): 1 [drp] via OPHTHALMIC

## 2019-12-14 MED ORDER — MIDAZOLAM HCL 2 MG/2ML IJ SOLN
INTRAMUSCULAR | Status: DC | PRN
  Administered 2019-12-14: 2 mg via INTRAVENOUS

## 2019-12-14 MED ORDER — MOXIFLOXACIN HCL 0.5 % OP SOLN
OPHTHALMIC | Status: DC | PRN
  Administered 2019-12-14: 0.2 mL via OPHTHALMIC

## 2019-12-14 MED ORDER — BRIMONIDINE TARTRATE-TIMOLOL 0.2-0.5 % OP SOLN
OPHTHALMIC | Status: DC | PRN
  Administered 2019-12-14: 1 [drp] via OPHTHALMIC

## 2019-12-14 MED ORDER — FENTANYL CITRATE (PF) 100 MCG/2ML IJ SOLN
INTRAMUSCULAR | Status: DC | PRN
  Administered 2019-12-14 (×2): 50 ug via INTRAVENOUS

## 2019-12-14 MED ORDER — LACTATED RINGERS IV SOLN: INTRAVENOUS | Status: DC

## 2019-12-14 MED ORDER — ARMC OPHTHALMIC DILATING DROPS
1.0000 "application " | OPHTHALMIC | Status: DC | PRN
  Administered 2019-12-14 (×3): 1 via OPHTHALMIC

## 2019-12-14 MED ORDER — ONDANSETRON HCL 4 MG/2ML IJ SOLN: 4.0000 mg | Freq: Once | INTRAMUSCULAR | Status: DC | PRN

## 2019-12-14 SURGICAL SUPPLY — 24 items
CANNULA ANT/CHMB 27G (MISCELLANEOUS) ×1 IMPLANT
CANNULA ANT/CHMB 27GA (MISCELLANEOUS) ×2 IMPLANT
GLOVE SURG LX 7.5 STRW (GLOVE) ×1
GLOVE SURG LX STRL 7.5 STRW (GLOVE) ×1 IMPLANT
GLOVE SURG TRIUMPH 8.0 PF LTX (GLOVE) ×2 IMPLANT
GOWN STRL REUS W/ TWL LRG LVL3 (GOWN DISPOSABLE) ×2 IMPLANT
GOWN STRL REUS W/TWL LRG LVL3 (GOWN DISPOSABLE) ×4
LENS IOL EYHANCE TORIC II 18.5 ×2 IMPLANT
LENS IOL EYHANCE TRC 150 18.5 IMPLANT
LENS IOL EYHNC TORIC 150 18.5 ×1 IMPLANT
MARKER SKIN DUAL TIP RULER LAB (MISCELLANEOUS) ×2 IMPLANT
NDL CAPSULORHEX 25GA (NEEDLE) ×1 IMPLANT
NDL FILTER BLUNT 18X1 1/2 (NEEDLE) ×2 IMPLANT
NEEDLE CAPSULORHEX 25GA (NEEDLE) ×2 IMPLANT
NEEDLE FILTER BLUNT 18X 1/2SAF (NEEDLE) ×2
NEEDLE FILTER BLUNT 18X1 1/2 (NEEDLE) ×2 IMPLANT
PACK CATARACT BRASINGTON (MISCELLANEOUS) ×2 IMPLANT
PACK EYE AFTER SURG (MISCELLANEOUS) ×2 IMPLANT
PACK OPTHALMIC (MISCELLANEOUS) ×2 IMPLANT
SOLUTION OPHTHALMIC SALT (MISCELLANEOUS) ×2 IMPLANT
SYR 3ML LL SCALE MARK (SYRINGE) ×4 IMPLANT
SYR TB 1ML LUER SLIP (SYRINGE) ×2 IMPLANT
WATER STERILE IRR 250ML POUR (IV SOLUTION) ×2 IMPLANT
WIPE NON LINTING 3.25X3.25 (MISCELLANEOUS) ×2 IMPLANT

## 2019-12-14 NOTE — H&P (Signed)

## 2019-12-14 NOTE — Transfer of Care (Signed)
Immediate Anesthesia Transfer of Care Note  Patient: Zoe Alvarado  Procedure(s) Performed: CATARACT EXTRACTION PHACO AND INTRAOCULAR LENS PLACEMENT (IOC) RIGHT EYHANCE TORIC 3.77 00:41.5 901% (Right Eye)  Patient Location: PACU  Anesthesia Type: MAC  Level of Consciousness: awake, alert  and patient cooperative  Airway and Oxygen Therapy: Patient Spontanous Breathing and Patient connected to supplemental oxygen  Post-op Assessment: Post-op Vital signs reviewed, Patient's Cardiovascular Status Stable, Respiratory Function Stable, Patent Airway and No signs of Nausea or vomiting  Post-op Vital Signs: Reviewed and stable  Complications: No complications documented.

## 2019-12-14 NOTE — Anesthesia Preprocedure Evaluation (Signed)
Anesthesia Evaluation  Patient identified by MRN, date of birth, ID band Patient awake    Reviewed: Allergy & Precautions, H&P , NPO status , Patient's Chart, lab work & pertinent test results, reviewed documented beta blocker date and time   Airway Mallampati: II  TM Distance: >3 FB Neck ROM: full    Dental no notable dental hx.    Pulmonary neg pulmonary ROS,    Pulmonary exam normal breath sounds clear to auscultation       Cardiovascular Exercise Tolerance: Good hypertension,  Rhythm:regular Rate:Normal     Neuro/Psych negative neurological ROS  negative psych ROS   GI/Hepatic negative GI ROS, Neg liver ROS,   Endo/Other  negative endocrine ROS  Renal/GU negative Renal ROS  negative genitourinary   Musculoskeletal   Abdominal   Peds  Hematology negative hematology ROS (+)   Anesthesia Other Findings   Reproductive/Obstetrics negative OB ROS                             Anesthesia Physical Anesthesia Plan  ASA: II  Anesthesia Plan: MAC   Post-op Pain Management:    Induction:   PONV Risk Score and Plan: 2 and Treatment may vary due to age or medical condition  Airway Management Planned:   Additional Equipment:   Intra-op Plan:   Post-operative Plan:   Informed Consent: I have reviewed the patients History and Physical, chart, labs and discussed the procedure including the risks, benefits and alternatives for the proposed anesthesia with the patient or authorized representative who has indicated his/her understanding and acceptance.     Dental Advisory Given  Plan Discussed with: CRNA  Anesthesia Plan Comments:         Anesthesia Quick Evaluation

## 2019-12-14 NOTE — Anesthesia Postprocedure Evaluation (Signed)
Anesthesia Post Note  Patient: Zoe Alvarado  Procedure(s) Performed: CATARACT EXTRACTION PHACO AND INTRAOCULAR LENS PLACEMENT (IOC) RIGHT EYHANCE TORIC 3.77 00:41.5 901% (Right Eye)     Patient location during evaluation: PACU Anesthesia Type: MAC Level of consciousness: awake and alert Pain management: pain level controlled Vital Signs Assessment: post-procedure vital signs reviewed and stable Respiratory status: spontaneous breathing, nonlabored ventilation, respiratory function stable and patient connected to nasal cannula oxygen Cardiovascular status: stable and blood pressure returned to baseline Postop Assessment: no apparent nausea or vomiting Anesthetic complications: no   No complications documented.  Alisa Graff

## 2019-12-14 NOTE — Anesthesia Procedure Notes (Signed)
Procedure Name: MAC Date/Time: 12/14/2019 1:39 PM Performed by: Jeannene Patella, CRNA Pre-anesthesia Checklist: Patient identified, Emergency Drugs available, Suction available, Timeout performed and Patient being monitored Patient Re-evaluated:Patient Re-evaluated prior to induction Oxygen Delivery Method: Nasal cannula Placement Confirmation: positive ETCO2

## 2019-12-14 NOTE — Op Note (Signed)
LOCATION:  [facility]   PREOPERATIVE DIAGNOSIS:  Nuclear sclerotic cataract of the right eye.  H25.11   POSTOPERATIVE DIAGNOSIS:  Nuclear sclerotic cataract of the right eye.   PROCEDURE:  Phacoemulsification with Toric posterior chamber intraocular lens placement of the right eye.  Ultrasound time: Procedure(s): CATARACT EXTRACTION PHACO AND INTRAOCULAR LENS PLACEMENT (IOC) RIGHT EYHANCE TORIC 3.77 00:41.5 901% (Right)  LENS:   Implant Name Type Inv. Item Serial No. Manufacturer Lot No. LRB No. Used Action  LENS TORIC II EYHANCE 18.5 - [id number]  LENS TORIC II EYHANCE 18.5 [id number] [id number]   Right 1 Implanted      Toric intraocular lens with 1.5 diopters of cylindrical power with axis orientation at 11 degrees.    SURGEON:  Deirdre Evener, MD   ANESTHESIA: Topical with tetracaine drops and 2% Xylocaine jelly, augmented with 1% preservative-free intracameral lidocaine. .   COMPLICATIONS:  None.   DESCRIPTION OF PROCEDURE:  The patient was identified in the holding room and transported to the operating suite and placed in the supine position under the operating microscope.  The right eye was identified as the operative eye, and it was prepped and draped in the usual sterile ophthalmic fashion.    A clear-corneal paracentesis incision was made at the 12:00 position.  0.5 ml of preservative-free 1% lidocaine was injected into the anterior chamber. The anterior chamber was filled with Viscoat.  A 2.4 millimeter near clear corneal incision was then made at the 9:00 position.  A cystotome and capsulorrhexis forceps were then used to make a curvilinear capsulorrhexis.  Hydrodissection and hydrodelineation were then performed using balanced salt solution.   Phacoemulsification was then used in stop and chop fashion to remove the lens, nucleus and epinucleus.  The remaining cortex was aspirated using the irrigation and aspiration handpiece.  Provisc viscoelastic was then  placed into the capsular bag to distend it for lens placement.  The Verion digital marker was used to align the implant at the intended axis.   A Toric lens was then injected into the capsular bag.  It was rotated clockwise until the axis marks on the lens were approximately 15 degrees in the counterclockwise direction to the intended alignment.  The viscoelastic was aspirated from the eye using the irrigation aspiration handpiece.  Then, a Koch spatula through the sideport incision was used to rotate the lens in a clockwise direction until the axis markings of the intraocular lens were lined up with the Verion alignment.  Balanced salt solution was then used to hydrate the wounds. Vigamox 0.2 ml of a 1mg  per ml solution was injected into the anterior chamber for a dose of 0.2 mg of intracameral antibiotic at the completion of the case.    The eye was noted to have a physiologic pressure and there was no wound leak noted.   Timolol and Brimonidine drops were applied to the eye.  The patient was taken to the recovery room in stable condition having had no complications of anesthesia or surgery.  Charese Abundis 12/14/2019, 1:56 PM

## 2019-12-15 ENCOUNTER — Encounter: Payer: Self-pay | Admitting: Ophthalmology

## 2019-12-21 ENCOUNTER — Ambulatory Visit: Payer: Medicare Other | Admitting: Physical Therapy

## 2019-12-28 ENCOUNTER — Ambulatory Visit: Payer: Medicare Other | Admitting: Physical Therapy

## 2020-01-04 ENCOUNTER — Ambulatory Visit: Payer: Medicare Other | Admitting: Physical Therapy

## 2020-01-11 ENCOUNTER — Other Ambulatory Visit: Payer: Self-pay

## 2020-01-11 ENCOUNTER — Ambulatory Visit: Payer: Medicare Other | Attending: Obstetrics and Gynecology | Admitting: Physical Therapy

## 2020-01-11 DIAGNOSIS — M25561 Pain in right knee: Secondary | ICD-10-CM | POA: Insufficient documentation

## 2020-01-11 DIAGNOSIS — R2689 Other abnormalities of gait and mobility: Secondary | ICD-10-CM | POA: Insufficient documentation

## 2020-01-11 DIAGNOSIS — G8929 Other chronic pain: Secondary | ICD-10-CM

## 2020-01-11 DIAGNOSIS — M6281 Muscle weakness (generalized): Secondary | ICD-10-CM | POA: Insufficient documentation

## 2020-01-11 NOTE — Patient Instructions (Signed)
PELVIC FLOOR / KEGEL EXERCISES   Pelvic floor/ Kegel exercises are used to strengthen the muscles in the base of your pelvis that are responsible for supporting your pelvic organs and preventing urine/feces leakage. Based on your therapists recommendations, they can be performed while standing, sitting, or lying down. Imagine pelvic floor area as a diamond with pelvic landmarks: top =pubic bone, bottom tip=tailbone, sides=sitting bones (ischial tuberosities).    Make yourself aware of this muscle group by using these cues while coordinating your breath:  Inhale, feel pelvic floor diamond area lower like hammock towards your feet and ribcage/belly expanding. Pause. Let the exhale naturally and feel your belly sink, abdominal muscles hugging in around you and you may notice the pelvic diamond draws upward towards your head forming a umbrella shape. Give a squeeze during the exhalation like you are stopping the flow of urine. If you are squeezing the buttock muscles, try to give 50% less effort.   Common Errors:  Breath holding: If you are holding your breath, you may be bearing down against your bladder instead of pulling it up. If you belly bulges up while you are squeezing, you are holding your breath. Be sure to breathe gently in and out while exercising. Counting out loud may help you avoid holding your breath.  Accessory muscle use: You should not see or feel other muscle movement when performing pelvic floor exercises. When done properly, no one can tell that you are performing the exercises. Keep the buttocks, belly and inner thighs relaxed.  Overdoing it: Your muscles can fatigue and stop working for you if you over-exercise. You may actually leak more or feel soreness at the lower abdomen or rectum.  YOUR HOME EXERCISE PROGRAM     SHORT HOLDS: Position: on back with pillow under hips  Inhale and then exhale. Then squeeze the muscle.  (Be sure to let belly sink in with exhales and  not push outward)  Perform 7 repetitions, 3 different   Times/day  ( deep core level 1)  (Then do Deep core level 2 with pillow under hips)    3 x day                    DECREASE DOWNWARD PRESSURE ON  YOUR PELVIC FLOOR, ABDOMINAL, LOW BACK MUSCLES       PRESERVE YOUR PELVIC HEALTH LONG-TERM   ** SQUEEZE pelvic floor BEFORE YOUR SNEEZE, COUGH, LAUGH   ** EXHALE BEFORE YOU RISE AGAINST GRAVITY (lifting, sit to stand, from squat to stand)   ** LOG ROLL OUT OF BED INSTEAD OF CRUNCH/SIT-UP

## 2020-01-11 NOTE — Therapy (Addendum)
[place] MAIN [facility] SERVICES [address] [place], [place], [address] Phone: [phone]   Fax:  [phone]  Physical Therapy Treatment  Patient Details  Name: Zoe Alvarado MRN: [id number] Date of Birth: Sep 28, 1945 Referring Provider (PT): Rubie Maid    Encounter Date: 01/11/2020   PT End of Session - 01/11/20 1408    Visit Number 6    Number of Visits 10    Date for PT Re-Evaluation 02/15/20   eval 10/26/19   PT Start Time 1300    PT Stop Time 1761    PT Time Calculation (min) 55 min    Activity Tolerance Patient tolerated treatment well           Past Medical History:  Diagnosis Date  . Acute non-recurrent maxillary sinusitis 03/20/2016  . Allergy   . 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 Cataract years  . Hyperlipidemia   . Hypertension   . Osteoporosis   . Pneumonia   . Prolapse of female pelvic organs     Past Surgical History:  Procedure Laterality Date  . ABDOMINAL HYSTERECTOMY    . CATARACT EXTRACTION W/PHACO Left 11/24/2019   Procedure: CATARACT EXTRACTION PHACO AND INTRAOCULAR LENS PLACEMENT ([place]) LEFT Tyler Deis;  Surgeon: Leandrew Koyanagi, MD;  Location: [facility];  Service: Ophthalmology;  Laterality: Left;  cde 16.31 [place] 1:39 ap 16.5   . CATARACT EXTRACTION W/PHACO Right 12/14/2019   Procedure: CATARACT EXTRACTION PHACO AND INTRAOCULAR LENS PLACEMENT ([facility]) RIGHT EYHANCE TORIC 3.77 00:41.5 901%;  Surgeon: Leandrew Koyanagi, MD;  Location: [place];  Service: Ophthalmology;  Laterality: Right;  . CHOLECYSTECTOMY    . DENTAL SURGERY     dental implants  . DILATION AND CURETTAGE OF UTERUS    . KNEE SURGERY     arthroscopy  . TONSILLECTOMY      There were no vitals filed for this visit.   Subjective Assessment - 01/11/20 1303    Subjective Pt noticed leakage occurs more when she has been asleep   or has been sitting for a long time. Otherwise, pt has noticed s 15% improvement     Pertinent History prolapse of pelvic organs, abdominal hysterectomy, D & C due to marriage, gall bladder removal, osteoporosis (report in records reviewed) , R knee pain, Hx of CA. No LBP. No dyspareunia.    Patient Stated Goals to avoid surgery and improve continence and improve R knee pain                          Pelvic Floor Special Questions - 01/11/20 1304    Prolapse Anterior Wall;Posterior Wall   inside introitus, excessive downward force w/ cough    Pelvic Floor Internal Exam pt consented verbally without contraindications     Exam Type Vaginal    Palpation scar retrictions 6-9 o'clock ,      Strength good squeeze, good lift, able to hold agaisnt strong resistance   7 quick, 1 sec              Tx: Manual Tx,   Internal Tx with fascial releases at scar restrictions  Neuromuscular re-edu:  Cued for pelvic floor contractions             PT Long Term Goals - 12/06/19 1525      PT LONG TERM GOAL #1   Title Pt will demo decreased time with 5 TST from 18 sec  to < 17 sec in order to minimize falls    Time 4    Status On-going      PT LONG TERM GOAL #2   Title Pt will report increased stool consistency to Type 4 100% of the time and no straining with all bowel movements in order to minimize lowered position of pelvic organs and improve continence    Time 6    Period Weeks    Status Partially Met      PT LONG TERM GOAL #3   Title Pt will be able to climb stairs in her house safely with less knee pain by 50% in order to get to her 2nd floor safely  ( 7/7  33%)    Time 4    Status Partially Met      PT LONG TERM GOAL #4   Title Pt will demo co-activation with pelvic floor in order to not leak with getting up from bed and sitting in order to improve continence    Time 6    Period Weeks    Status Partially Met      PT LONG TERM GOAL #5   Title Pt will be compliant with shoe lift wear and demo levelled iliac crest and less spinal deviations in order  to progress to gait training and deep core strengthening to minimize urinary incontinence    Time 2    Period Weeks    Status Achieved      PT LONG TERM GOAL #6   Title Pt will increase FOTO prolapse pts 25 pts, Urinary 49 pts with increased points of > 8 pts in order to improve QOL ( 7/20: 21 pts of change for both )    Time 10    Period Weeks    Status Achieved      PT LONG TERM GOAL #7   Title Pt will demo proper body mechanics to minimize straining pelvic floor and worsening prolapse    Time 2    Period Weeks    Status Achieved      PT LONG TERM GOAL #8   Title Pt will demo increased R LE strength from R knee flex/ext 3+/5 to > 4/5 in order to improve gait    Time 8    Period Weeks    Status On-going                 Plan - 01/11/20 1409    Clinical Impression Statement Pt returned after a hiatus from PT to take care of cataract surgery. Pt showed increased pelvic floor scar restrictions, poor co-activation of anterior pelvic floor mm,  and descent of pelvic organs with cue for cough. Pt demo'd decreased tightness at perineal scars and demo'd more circumferential and sequential contraction of pelvic floor post Tx. Pt required a more anti-gravity position with a folded towel under pelvis to facilitate a stronger pelvic floor contraction. Endurance pelvic floor training will be initiated at next session. Pt continues to benefit from skilled PT    Personal Factors and Comorbidities Comorbidity 3+    Comorbidities prolapse of pelvic organs, abdominal hysterectomy, D & C due to marriage, gall bladder removal, osteoporosis (report in records reviewed) , R knee pain, Hx of CA.    Examination-Activity Limitations Squat;Stairs;Stand;Toileting;Sit    Stability/Clinical Decision Making Evolving/Moderate complexity    Rehab Potential Good    PT Frequency 1x / week    PT Duration Other (comment)   10   PT Treatment/Interventions Therapeutic  activities;Patient/family  education;Therapeutic exercise;Neuromuscular re-education;Moist Heat;Scar mobilization;Balance training;Manual techniques;Splinting;Taping;Gait training;Functional mobility training           Patient will benefit from skilled therapeutic intervention in order to improve the following deficits and impairments:  Decreased coordination, Decreased range of motion, Abnormal gait, Decreased endurance, Decreased safety awareness, Increased muscle spasms, Decreased balance, Decreased strength, Decreased mobility, Impaired sensation, Postural dysfunction, Improper body mechanics, Decreased scar mobility, Impaired flexibility, Hypomobility, Pain, Decreased knowledge of precautions  Visit Diagnosis: Other abnormalities of gait and mobility  Muscle weakness (generalized)  Chronic pain of right knee     Problem List Patient Active Problem List   Diagnosis Date Noted  . Cystocele with rectocele 05/05/2019  . Post-menopause on HRT (hormone replacement therapy) 05/05/2019  . OP (osteoporosis) 01/15/2017  . Arthritis, degenerative 01/15/2017  . Climacteric 01/15/2017  . Obesity (BMI 30.0-34.9) 05/02/2015  . Hypertension 12/06/2014  . Dyslipidemia 12/06/2014    Jerl Mina ,PT, DPT, E-RYT  01/11/2020, 2:13 PM  [place] MAIN [facility] SERVICES [address] [place], [place], [address] Phone: [phone]   Fax:  [phone]  Name: Zoe Alvarado MRN: [id number] Date of Birth: 17-Apr-1946

## 2020-01-18 ENCOUNTER — Ambulatory Visit: Payer: Medicare Other | Attending: Obstetrics and Gynecology | Admitting: Physical Therapy

## 2020-01-18 ENCOUNTER — Other Ambulatory Visit: Payer: Self-pay

## 2020-01-18 DIAGNOSIS — G8929 Other chronic pain: Secondary | ICD-10-CM | POA: Insufficient documentation

## 2020-01-18 DIAGNOSIS — M6281 Muscle weakness (generalized): Secondary | ICD-10-CM | POA: Insufficient documentation

## 2020-01-18 DIAGNOSIS — M25561 Pain in right knee: Secondary | ICD-10-CM | POA: Diagnosis present

## 2020-01-18 DIAGNOSIS — R2689 Other abnormalities of gait and mobility: Secondary | ICD-10-CM

## 2020-01-18 NOTE — Therapy (Signed)
[place] MAIN [facility] SERVICES [address] [place], [place], [address] Phone: [phone]   Fax:  [phone]  Physical Therapy Treatment  Patient Details  Name: Zoe Alvarado MRN: [id number] Date of Birth: Jun 21, 1945 Referring Provider (PT): Rubie Maid    Encounter Date: 01/18/2020   PT End of Session - 01/18/20 1341    Visit Number 7    Number of Visits 10    Date for PT Re-Evaluation 02/15/20   eval 10/26/19   PT Start Time 1300    PT Stop Time 1400    PT Time Calculation (min) 60 min    Activity Tolerance Patient tolerated treatment well           Past Medical History:  Diagnosis Date  . Acute non-recurrent maxillary sinusitis 03/20/2016  . Allergy   . 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 Cataract years  . Hyperlipidemia   . Hypertension   . Osteoporosis   . Pneumonia   . Prolapse of female pelvic organs     Past Surgical History:  Procedure Laterality Date  . ABDOMINAL HYSTERECTOMY    . CATARACT EXTRACTION W/PHACO Left 11/24/2019   Procedure: CATARACT EXTRACTION PHACO AND INTRAOCULAR LENS PLACEMENT ([place]) LEFT Tyler Deis;  Surgeon: Leandrew Koyanagi, MD;  Location: [facility];  Service: Ophthalmology;  Laterality: Left;  cde 16.31 [place] 1:39 ap 16.5   . CATARACT EXTRACTION W/PHACO Right 12/14/2019   Procedure: CATARACT EXTRACTION PHACO AND INTRAOCULAR LENS PLACEMENT ([facility]) RIGHT EYHANCE TORIC 3.77 00:41.5 901%;  Surgeon: Leandrew Koyanagi, MD;  Location: [place];  Service: Ophthalmology;  Laterality: Right;  . CHOLECYSTECTOMY    . DENTAL SURGERY     dental implants  . DILATION AND CURETTAGE OF UTERUS    . KNEE SURGERY     arthroscopy  . TONSILLECTOMY      There were no vitals filed for this visit.   Subjective Assessment - 01/18/20 1305    Subjective Pt reported no soreness after last session    Pertinent History prolapse of pelvic organs, abdominal hysterectomy, D & C due to marriage, gall bladder  removal, osteoporosis (report in records reviewed) , R knee pain, Hx of CA. No LBP. No dyspareunia.    Patient Stated Goals to avoid surgery and improve continence and improve R knee pain              [facility] PT Assessment - 01/18/20 1340      Strength   Overall Strength Comments hip abd 3/5 B                       Pelvic Floor Special Questions - 01/18/20 1336    Prolapse --    behind iliococygeus, able to lift mm prior cough   Pelvic Floor Internal Exam pt consented verbally without contraindications     Exam Type Vaginal    Palpation scar retrictions 6-9 o'clock 1-3 layers, bladder more upward position without folded towel    no tenderness    Strength good squeeze, good lift, able to hold agaisnt strong resistance   10 reps             [facility] Adult PT Treatment/Exercise - 01/18/20 1345      Neuro Re-ed    Neuro Re-ed Details  cued for clams wtih band, squeeze of pelvic flioor with cough      Exercises   Exercises --   see pt instructions  Moist Heat Therapy   Moist Heat Location --   perineum      Manual Therapy   Internal Pelvic Floor perineal scar releases                        PT Long Term Goals - 12/06/19 1525      PT LONG TERM GOAL #1   Title Pt will demo decreased time with 5 TST from 18 sec to < 17 sec in order to minimize falls    Time 4    Status On-going      PT LONG TERM GOAL #2   Title Pt will report increased stool consistency to Type 4 100% of the time and no straining with all bowel movements in order to minimize lowered position of pelvic organs and improve continence    Time 6    Period Weeks    Status Partially Met      PT LONG TERM GOAL #3   Title Pt will be able to climb stairs in her house safely with less knee pain by 50% in order to get to her 2nd floor safely  ( 7/7  33%)    Time 4    Status Partially Met      PT LONG TERM GOAL #4   Title Pt will demo co-activation with pelvic floor in order to not leak  with getting up from bed and sitting in order to improve continence    Time 6    Period Weeks    Status Partially Met      PT LONG TERM GOAL #5   Title Pt will be compliant with shoe lift wear and demo levelled iliac crest and less spinal deviations in order to progress to gait training and deep core strengthening to minimize urinary incontinence    Time 2    Period Weeks    Status Achieved      PT LONG TERM GOAL #6   Title Pt will increase FOTO prolapse pts 25 pts, Urinary 49 pts with increased points of > 8 pts in order to improve QOL ( 7/20: 21 pts of change for both )    Time 10    Period Weeks    Status Achieved      PT LONG TERM GOAL #7   Title Pt will demo proper body mechanics to minimize straining pelvic floor and worsening prolapse    Time 2    Period Weeks    Status Achieved      PT LONG TERM GOAL #8   Title Pt will demo increased R LE strength from R knee flex/ext 3+/5 to > 4/5 in order to improve gait    Time 8    Period Weeks    Status On-going                 Plan - 01/18/20 1342    Clinical Impression Statement Pt demo'd decreasing perineal scar today without tenderness with palpation. Pt demo'd a more upward position with bladder without hips in an elevated position with towel under hips. Pt is building more pelvic floor strength with decreased pelvic floor scar restrictions. Progressed hip strengthening with resistance band in seated position. Pt continues to benefit from skilled PT   Personal Factors and Comorbidities Comorbidity 3+    Comorbidities prolapse of pelvic organs, abdominal hysterectomy, D & C due to marriage, gall bladder removal, osteoporosis (report in records reviewed) , R knee pain, Hx  of CA.    Examination-Activity Limitations Squat;Stairs;Stand;Toileting;Sit    Stability/Clinical Decision Making Evolving/Moderate complexity    Rehab Potential Good    PT Frequency 1x / week    PT Duration Other (comment)   10   PT  Treatment/Interventions Therapeutic activities;Patient/family education;Therapeutic exercise;Neuromuscular re-education;Moist Heat;Scar mobilization;Balance training;Manual techniques;Splinting;Taping;Gait training;Functional mobility training           Patient will benefit from skilled therapeutic intervention in order to improve the following deficits and impairments:  Decreased coordination, Decreased range of motion, Abnormal gait, Decreased endurance, Decreased safety awareness, Increased muscle spasms, Decreased balance, Decreased strength, Decreased mobility, Impaired sensation, Postural dysfunction, Improper body mechanics, Decreased scar mobility, Impaired flexibility, Hypomobility, Pain, Decreased knowledge of precautions  Visit Diagnosis: Other abnormalities of gait and mobility  Muscle weakness (generalized)  Chronic pain of right knee     Problem List Patient Active Problem List   Diagnosis Date Noted  . Cystocele with rectocele 05/05/2019  . Post-menopause on HRT (hormone replacement therapy) 05/05/2019  . OP (osteoporosis) 01/15/2017  . Arthritis, degenerative 01/15/2017  . Climacteric 01/15/2017  . Obesity (BMI 30.0-34.9) 05/02/2015  . Hypertension 12/06/2014  . Dyslipidemia 12/06/2014    Jerl Mina ,PT, DPT, E-RYT   01/18/2020, 1:50 PM  [place] MAIN [facility] SERVICES [address] [place], [place], [address] Phone: [phone]   Fax:  [phone]  Name: Zoe Alvarado MRN: [id number] Date of Birth: Jan 19, 1946

## 2020-01-18 NOTE — Patient Instructions (Addendum)
7 pelvic floor reps x 3 x day    Quick squeeze before cough  __  Seated clams with red band at thigh Inhale,]exhale, knee apart  20 reps  X  2 x day

## 2020-01-25 ENCOUNTER — Ambulatory Visit: Payer: Medicare Other | Admitting: Physical Therapy

## 2020-01-25 ENCOUNTER — Other Ambulatory Visit: Payer: Self-pay

## 2020-01-25 DIAGNOSIS — R2689 Other abnormalities of gait and mobility: Secondary | ICD-10-CM

## 2020-01-25 DIAGNOSIS — G8929 Other chronic pain: Secondary | ICD-10-CM

## 2020-01-25 DIAGNOSIS — M6281 Muscle weakness (generalized): Secondary | ICD-10-CM

## 2020-01-25 NOTE — Patient Instructions (Addendum)
On belly,  Lift R thigh/ leg up on exhale 20 reps   R one only  ___   Seated w  Band under feet  inhale, wide ribs  Exhale, elbow by side, forearms out   20 reps   ____

## 2020-01-25 NOTE — Therapy (Signed)
[place] MAIN [facility] SERVICES [address] [place], [place], [address] Phone: [phone]   Fax:  [phone]  Physical Therapy Treatment  Patient Details  Name: Zoe Alvarado MRN: [id number] Date of Birth: 05-Dec-1945 Referring Provider (PT): Rubie Maid    Encounter Date: 01/25/2020   PT End of Session - 01/25/20 1222    Visit Number 8    Number of Visits 10    Date for PT Re-Evaluation 02/15/20   eval 10/26/19   PT Start Time 1200    PT Stop Time 1300    PT Time Calculation (min) 60 min    Activity Tolerance Patient tolerated treatment well           Past Medical History:  Diagnosis Date  . Acute non-recurrent maxillary sinusitis 03/20/2016  . Allergy   . 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 Cataract years  . Hyperlipidemia   . Hypertension   . Osteoporosis   . Pneumonia   . Prolapse of female pelvic organs     Past Surgical History:  Procedure Laterality Date  . ABDOMINAL HYSTERECTOMY    . CATARACT EXTRACTION W/PHACO Left 11/24/2019   Procedure: CATARACT EXTRACTION PHACO AND INTRAOCULAR LENS PLACEMENT ([place]) LEFT Tyler Deis;  Surgeon: Leandrew Koyanagi, MD;  Location: [facility];  Service: Ophthalmology;  Laterality: Left;  cde 16.31 [place] 1:39 ap 16.5   . CATARACT EXTRACTION W/PHACO Right 12/14/2019   Procedure: CATARACT EXTRACTION PHACO AND INTRAOCULAR LENS PLACEMENT ([facility]) RIGHT EYHANCE TORIC 3.77 00:41.5 901%;  Surgeon: Leandrew Koyanagi, MD;  Location: [place];  Service: Ophthalmology;  Laterality: Right;  . CHOLECYSTECTOMY    . DENTAL SURGERY     dental implants  . DILATION AND CURETTAGE OF UTERUS    . KNEE SURGERY     arthroscopy  . TONSILLECTOMY      There were no vitals filed for this visit.   Subjective Assessment - 01/25/20 1215    Subjective Pt reported she is not changing pads as much. Incontinence has improved by 40-50%.  she plans to return to the fitness gym. Pt is having trouble bending  her R knee. When shopping and she is stoping and going, she notices her R knee pain when she gets home    Pertinent History prolapse of pelvic organs, abdominal hysterectomy, D & C due to marriage, gall bladder removal, osteoporosis (report in records reviewed) , R knee pain, Hx of CA. No LBP. No dyspareunia.    Patient Stated Goals to avoid surgery and improve continence and improve R knee pain              [facility] PT Assessment - 01/25/20 1222      Coordination   Gross Motor Movements are Fluid and Coordinated --   seated breathing: chest breathing    Fine Motor Movements are Fluid and Coordinated --   chest breathing in seated position     Other:   Other/Comments simulated weight machine: breathholding, straining on pelvic floor       Strength   Overall Strength Comments hip ext L 3+/5       Palpation   SI assessment  iliac crest levelled,     Palpation comment tightness at peroneal longus, swelling R knee                          [facility] Adult PT Treatment/Exercise - 01/25/20 1247  Therapeutic Activites    Other Therapeutic Activities educated on avoiding machines that causes downward motion onto pelvic floor       Neuro Re-ed    Neuro Re-ed Details  cued for new R LE strengthening and upper body       Exercises   Exercises --   see pt instructions     Manual Therapy   Manual therapy comments AP mob R knee, peroneal longus STM                        PT Long Term Goals - 12/06/19 1525      PT LONG TERM GOAL #1   Title Pt will demo decreased time with 5 TST from 18 sec to < 17 sec in order to minimize falls    Time 4    Status On-going      PT LONG TERM GOAL #2   Title Pt will report increased stool consistency to Type 4 100% of the time and no straining with all bowel movements in order to minimize lowered position of pelvic organs and improve continence    Time 6    Period Weeks    Status Partially Met      PT LONG TERM GOAL #3    Title Pt will be able to climb stairs in her house safely with less knee pain by 50% in order to get to her 2nd floor safely  ( 7/7  33%)    Time 4    Status Partially Met      PT LONG TERM GOAL #4   Title Pt will demo co-activation with pelvic floor in order to not leak with getting up from bed and sitting in order to improve continence    Time 6    Period Weeks    Status Partially Met      PT LONG TERM GOAL #5   Title Pt will be compliant with shoe lift wear and demo levelled iliac crest and less spinal deviations in order to progress to gait training and deep core strengthening to minimize urinary incontinence    Time 2    Period Weeks    Status Achieved      PT LONG TERM GOAL #6   Title Pt will increase FOTO prolapse pts 25 pts, Urinary 49 pts with increased points of > 8 pts in order to improve QOL ( 7/20: 21 pts of change for both )    Time 10    Period Weeks    Status Achieved      PT LONG TERM GOAL #7   Title Pt will demo proper body mechanics to minimize straining pelvic floor and worsening prolapse    Time 2    Period Weeks    Status Achieved      PT LONG TERM GOAL #8   Title Pt will demo increased R LE strength from R knee flex/ext 3+/5 to > 4/5 in order to improve gait    Time 8    Period Weeks    Status On-going                 Plan - 01/25/20 1222    Clinical Impression Statement Pt is improving with incontinence and her prolapse is "not out as far". Pt's knee strength on RLE is improving and hip extension strength on R is still weaker. Added strengthening exercise today. Educated pt on ways to minimize straining pelvic floor with resistance  band exercises for upper body instead of using gym machines. Pt required cues for technique. Exercises were done in seated position with consideration of R knee OA. Cued for less chest breathng to activate deep core system.  Pt continues to benefit from skilled PT.    Personal Factors and Comorbidities Comorbidity 3+     Comorbidities prolapse of pelvic organs, abdominal hysterectomy, D & C due to marriage, gall bladder removal, osteoporosis (report in records reviewed) , R knee pain, Hx of CA.    Examination-Activity Limitations Squat;Stairs;Stand;Toileting;Sit    Stability/Clinical Decision Making Evolving/Moderate complexity    Rehab Potential Good    PT Frequency 1x / week    PT Duration Other (comment)   10   PT Treatment/Interventions Therapeutic activities;Patient/family education;Therapeutic exercise;Neuromuscular re-education;Moist Heat;Scar mobilization;Balance training;Manual techniques;Splinting;Taping;Gait training;Functional mobility training           Patient will benefit from skilled therapeutic intervention in order to improve the following deficits and impairments:  Decreased coordination, Decreased range of motion, Abnormal gait, Decreased endurance, Decreased safety awareness, Increased muscle spasms, Decreased balance, Decreased strength, Decreased mobility, Impaired sensation, Postural dysfunction, Improper body mechanics, Decreased scar mobility, Impaired flexibility, Hypomobility, Pain, Decreased knowledge of precautions  Visit Diagnosis: Other abnormalities of gait and mobility  Muscle weakness (generalized)  Chronic pain of right knee     Problem List Patient Active Problem List   Diagnosis Date Noted  . Cystocele with rectocele 05/05/2019  . Post-menopause on HRT (hormone replacement therapy) 05/05/2019  . OP (osteoporosis) 01/15/2017  . Arthritis, degenerative 01/15/2017  . Climacteric 01/15/2017  . Obesity (BMI 30.0-34.9) 05/02/2015  . Hypertension 12/06/2014  . Dyslipidemia 12/06/2014    Jerl Mina ,PT, DPT, E-RYT  01/25/2020, 12:51 PM  [place] MAIN [facility] SERVICES [address] [place], [place], [address] Phone: [phone]   Fax:  [phone]  Name: Zoe Alvarado MRN: [id number] Date of Birth: 03/20/1946

## 2020-02-01 ENCOUNTER — Ambulatory Visit: Payer: Medicare Other | Admitting: Physical Therapy

## 2020-02-01 ENCOUNTER — Other Ambulatory Visit: Payer: Self-pay

## 2020-02-01 DIAGNOSIS — M6281 Muscle weakness (generalized): Secondary | ICD-10-CM

## 2020-02-01 DIAGNOSIS — R2689 Other abnormalities of gait and mobility: Secondary | ICD-10-CM

## 2020-02-01 DIAGNOSIS — G8929 Other chronic pain: Secondary | ICD-10-CM

## 2020-02-01 NOTE — Patient Instructions (Addendum)
   Neck / shoulder stretches:    Lying on back - small sushi roll towel under neck  _ 6 directions  5 reps  _elbow circles in and out to lower shoulder blades down and back  10 reps  _angel wings, lower elbows down , keep arms touching bed  10 reps    __  Sitting with feet on the ground, hands on the chair as additional points of contact to practice head stacked over shoulders, pelvis   __  Sleep on one pillow when on your back not three pillows  Pillow is above shoulders    Sleep with two pillows when on your side And a pillow between knees    ___   Multifidis twist  Blue Band is on doorknob: sit in chair (facing perpendicular)   Twisting trunk without moving the hips and knees Hold band at the level of ribcage, elbows bent,shoulder blades roll back and down like squeezing a pencil under armpit    Exhale twist,.10-15 deg away from door without moving your hips/ knees. Continue to maintain equal weight through legs. Keep knee unlocked.  10 x 2   ___   WALKING WITH RESISTANCE Red Band at waist connected to doorknob Stepping forward normal length steps, planting mid and forefoot down, center of mass ( navel) leans forward slightly as if you were walking uphill 3-4 steps till band feels taut ( MAKE SURE THE DOOR IS LOCKED AND WON'T OPEN)   Stepping backwards, lower heel slowly, feet wider, hip width apart,   carry trunk and hips back as you step

## 2020-02-01 NOTE — Therapy (Signed)
[place] MAIN [facility] SERVICES [address] [place], [place], [address] Phone: [phone]   Fax:  [phone]  Physical Therapy Treatment  Patient Details  Name: Zoe Alvarado MRN: [id number] Date of Birth: 1945/12/11 Referring Provider (PT): Rubie Maid    Encounter Date: 02/01/2020   PT End of Session - 02/01/20 1217    Visit Number 9    Number of Visits 10    Date for PT Re-Evaluation 02/15/20   eval 10/26/19   PT Start Time 1210    PT Stop Time 1325    PT Time Calculation (min) 75 min    Activity Tolerance Patient tolerated treatment well           Past Medical History:  Diagnosis Date  . Acute non-recurrent maxillary sinusitis 03/20/2016  . Allergy   . 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 Cataract years  . Hyperlipidemia   . Hypertension   . Osteoporosis   . Pneumonia   . Prolapse of female pelvic organs     Past Surgical History:  Procedure Laterality Date  . ABDOMINAL HYSTERECTOMY    . CATARACT EXTRACTION W/PHACO Left 11/24/2019   Procedure: CATARACT EXTRACTION PHACO AND INTRAOCULAR LENS PLACEMENT ([place]) LEFT Tyler Deis;  Surgeon: Leandrew Koyanagi, MD;  Location: [facility];  Service: Ophthalmology;  Laterality: Left;  cde 16.31 [place] 1:39 ap 16.5   . CATARACT EXTRACTION W/PHACO Right 12/14/2019   Procedure: CATARACT EXTRACTION PHACO AND INTRAOCULAR LENS PLACEMENT ([facility]) RIGHT EYHANCE TORIC 3.77 00:41.5 901%;  Surgeon: Leandrew Koyanagi, MD;  Location: [place];  Service: Ophthalmology;  Laterality: Right;  . CHOLECYSTECTOMY    . DENTAL SURGERY     dental implants  . DILATION AND CURETTAGE OF UTERUS    . KNEE SURGERY     arthroscopy  . TONSILLECTOMY      There were no vitals filed for this visit.   Subjective Assessment - 02/01/20 1215    Subjective Pt reports her leakage has improved by 75%. Pt is sleeping through the night and not getting up as much to pee. Pt has not had any accidents. Pt still  has the prolapse.    Pertinent History prolapse of pelvic organs, abdominal hysterectomy, D & C due to marriage, gall bladder removal, osteoporosis (report in records reviewed) , R knee pain, Hx of CA. No LBP. No dyspareunia.    Patient Stated Goals to avoid surgery and improve continence and improve R knee pain              [facility] PT Assessment - 02/01/20 1330      Other:   Other/Comments poor eccentric control, narrow BOS with backward lunge stepping , forward head posture       Palpation   Palpation comment increased tightness at C/T junction, SCM, scalenes R                          [facility] Adult PT Treatment/Exercise - 02/01/20 1331      Therapeutic Activites    Other Therapeutic Activities discussed proper pillow placemnt and decreasing number of pillows when sleeping, discussed role of thoracic kyphosis on worsening risks for Fx 2/2 osteoporosis and fworsening prolapse and need to correct psoture before adding more upper body strengthening/ return to weight machines       Neuro Re-ed    Neuro Re-ed Details  excessive cues for propioception of neck alignment and new  HEP to correct forward posture       Moist Heat Therapy   Number Minutes Moist Heat 5 Minutes    Moist Heat Location --   cervical     Manual Therapy   Manual therapy comments STM/MWM distraction at C/T junction, C/T junction, SCM, scalenes R lengthening                        PT Long Term Goals - 02/01/20 1215      PT LONG TERM GOAL #1   Title Pt will demo decreased time with 5 TST from 18 sec to < 17 sec in order to minimize falls    Time 4    Status On-going      PT LONG TERM GOAL #2   Title Pt will report increased stool consistency to Type 4 100% of the time and no straining with all bowel movements in order to minimize lowered position of pelvic organs and improve continence    Time 6    Period Weeks    Status Partially Met      PT LONG TERM GOAL #3   Title Pt will be  able to climb stairs in her house safely with less knee pain by 50% in order to get to her 2nd floor safely  ( 7/7  33%)    Time 4    Status Partially Met      PT LONG TERM GOAL #4   Title Pt will demo co-activation with pelvic floor in order to not leak with getting up from bed and sitting in order to improve continence    Time 6    Period Weeks    Status Partially Met      PT LONG TERM GOAL #5   Title Pt will be compliant with shoe lift wear and demo levelled iliac crest and less spinal deviations in order to progress to gait training and deep core strengthening to minimize urinary incontinence    Time 2    Period Weeks    Status Achieved      PT LONG TERM GOAL #6   Title Pt will increase FOTO prolapse pts 25 pts, Urinary 49 pts with increased points of > 8 pts in order to improve QOL ( 7/20: 21 pts of change for both )    Time 10    Period Weeks    Status Achieved      PT LONG TERM GOAL #7   Title Pt will demo proper body mechanics to minimize straining pelvic floor and worsening prolapse    Time 2    Period Weeks    Status Achieved      PT LONG TERM GOAL #8   Title Pt will demo increased R LE strength from R knee flex/ext 3+/5 to > 4/5 in order to improve gait    Time 8    Period Weeks    Status Achieved                 Plan - 02/01/20 1321    Clinical Impression Statement Pt advanced to multifidis and lower kinetic chain strengthening today but required excessive cues for technique. Pt required manual Tx to minimize forward head posture and reported feeling looser in neck area. Discussed proper pillow placemnt and decreasing number of pillows when sleeping. Discussed role of thoracic kyphosis on worsening risks for Fx 2/2 osteoporosis and fworsening prolapse and need to correct psoture before adding more upper  body strengthening/ return to use of weight machines .    Pt continues to benefit from skilled PT   Personal Factors and Comorbidities Comorbidity 3+     Comorbidities prolapse of pelvic organs, abdominal hysterectomy, D & C due to marriage, gall bladder removal, osteoporosis (report in records reviewed) , R knee pain, Hx of CA.    Examination-Activity Limitations Squat;Stairs;Stand;Toileting;Sit    Stability/Clinical Decision Making Evolving/Moderate complexity    Rehab Potential Good    PT Frequency 1x / week    PT Duration Other (comment)   10   PT Treatment/Interventions Therapeutic activities;Patient/family education;Therapeutic exercise;Neuromuscular re-education;Moist Heat;Scar mobilization;Balance training;Manual techniques;Splinting;Taping;Gait training;Functional mobility training           Patient will benefit from skilled therapeutic intervention in order to improve the following deficits and impairments:  Decreased coordination, Decreased range of motion, Abnormal gait, Decreased endurance, Decreased safety awareness, Increased muscle spasms, Decreased balance, Decreased strength, Decreased mobility, Impaired sensation, Postural dysfunction, Improper body mechanics, Decreased scar mobility, Impaired flexibility, Hypomobility, Pain, Decreased knowledge of precautions  Visit Diagnosis: Other abnormalities of gait and mobility  Muscle weakness (generalized)  Chronic pain of right knee     Problem List Patient Active Problem List   Diagnosis Date Noted  . Cystocele with rectocele 05/05/2019  . Post-menopause on HRT (hormone replacement therapy) 05/05/2019  . OP (osteoporosis) 01/15/2017  . Arthritis, degenerative 01/15/2017  . Climacteric 01/15/2017  . Obesity (BMI 30.0-34.9) 05/02/2015  . Hypertension 12/06/2014  . Dyslipidemia 12/06/2014    Jerl Mina ,PT, DPT, E-RYT  02/01/2020, 1:33 PM  [place] MAIN [facility] SERVICES [address] [place], [place], [address] Phone: [phone]   Fax:  [phone]  Name: AME HEAGLE MRN: [id number] Date of Birth: 06/15/1945

## 2020-02-07 ENCOUNTER — Other Ambulatory Visit: Payer: Self-pay

## 2020-02-07 ENCOUNTER — Ambulatory Visit: Payer: Medicare Other | Admitting: Physical Therapy

## 2020-02-07 DIAGNOSIS — R2689 Other abnormalities of gait and mobility: Secondary | ICD-10-CM | POA: Diagnosis not present

## 2020-02-07 DIAGNOSIS — G8929 Other chronic pain: Secondary | ICD-10-CM

## 2020-02-07 DIAGNOSIS — M6281 Muscle weakness (generalized): Secondary | ICD-10-CM

## 2020-02-07 NOTE — Therapy (Signed)
[place] MAIN [facility] SERVICES [address] [place], [place], [address] Phone: [phone]   Fax:  [phone]  Physical Therapy Treatment / progress Note from 6/8 21 to 02/07/20 across 10 visits   Patient Details  Name: Zoe Alvarado MRN: [id number] Date of Birth: 11-19-1945 Referring Provider (PT): Rubie Maid    Encounter Date: 02/07/2020   PT End of Session - 02/07/20 1157    Visit Number 10    Number of Visits 10    Date for PT Re-Evaluation 04/17/20   PN 02/07/20   PT Start Time 1102    PT Stop Time 1200    PT Time Calculation (min) 58 min    Activity Tolerance Patient tolerated treatment well           Past Medical History:  Diagnosis Date  . Acute non-recurrent maxillary sinusitis 03/20/2016  . Allergy   . 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 Cataract years  . Hyperlipidemia   . Hypertension   . Osteoporosis   . Pneumonia   . Prolapse of female pelvic organs     Past Surgical History:  Procedure Laterality Date  . ABDOMINAL HYSTERECTOMY    . CATARACT EXTRACTION W/PHACO Left 11/24/2019   Procedure: CATARACT EXTRACTION PHACO AND INTRAOCULAR LENS PLACEMENT ([place]) LEFT Tyler Deis;  Surgeon: Leandrew Koyanagi, MD;  Location: [facility];  Service: Ophthalmology;  Laterality: Left;  cde 16.31 [place] 1:39 ap 16.5   . CATARACT EXTRACTION W/PHACO Right 12/14/2019   Procedure: CATARACT EXTRACTION PHACO AND INTRAOCULAR LENS PLACEMENT ([facility]) RIGHT EYHANCE TORIC 3.77 00:41.5 901%;  Surgeon: Leandrew Koyanagi, MD;  Location: [place];  Service: Ophthalmology;  Laterality: Right;  . CHOLECYSTECTOMY    . DENTAL SURGERY     dental implants  . DILATION AND CURETTAGE OF UTERUS    . KNEE SURGERY     arthroscopy  . TONSILLECTOMY      There were no vitals filed for this visit.   Subjective Assessment - 02/07/20 1105    Subjective Pt has a question about the band exercise from last week. Pt had no increased pain with new  HEP    Pertinent History prolapse of pelvic organs, abdominal hysterectomy, D & C due to marriage, gall bladder removal, osteoporosis (report in records reviewed) , R knee pain, Hx of CA. No LBP. No dyspareunia.    Patient Stated Goals to avoid surgery and improve continence and improve R knee pain              [facility] PT Assessment - 02/07/20 1339      Palpation   SI assessment  tightness L midthoracic, rhomboids, mid trap                          [facility] Adult PT Treatment/Exercise - 02/07/20 1200      Therapeutic Activites    Other Therapeutic Activities reassessed goals       Neuro Re-ed    Neuro Re-ed Details  cued for new resistance band HEP      Exercises   Exercises --   see pt instruction     Moist Heat Therapy   Number Minutes Moist Heat 5 Minutes    Moist Heat Location --   thoracic     Manual Therapy   Manual therapy comments STM/MWM to decrease midtrap, rhomoboids  L  PT Long Term Goals - 02/07/20 1158      PT LONG TERM GOAL #1   Title Pt will demo decreased time with 5 TST from 18 sec to < 17 sec in order to minimize falls    Time 4    Status On-going      PT LONG TERM GOAL #2   Title Pt will report increased stool consistency to Type 4 100% of the time and no straining with all bowel movements in order to minimize lowered position of pelvic organs and improve continence    Time 6    Period Weeks    Status Partially Met      PT LONG TERM GOAL #3   Title Pt will be able to climb stairs in her house safely with less knee pain by 50% in order to get to her 2nd floor safely  ( 7/7  33%)    Time 4    Status Partially Met      PT LONG TERM GOAL #4   Title Pt will demo co-activation with pelvic floor in order to not leak with getting up from bed and sitting in order to improve continence    Time 6    Period Weeks    Status Achieved      PT LONG TERM GOAL #5   Title Pt will be compliant with shoe lift wear  and demo levelled iliac crest and less spinal deviations in order to progress to gait training and deep core strengthening to minimize urinary incontinence    Time 2    Period Weeks    Status Achieved      Additional Long Term Goals   Additional Long Term Goals Yes      PT LONG TERM GOAL #6   Title Pt will increase FOTO prolapse pts 25 pts, Urinary 49 pts with increased points of > 8 pts in order to improve QOL ( 7/20: 21 pts of change for both , 9/21: Prolapse 0 pts, Urinary 8pts)  )    Time 10    Period Weeks    Status Achieved      PT LONG TERM GOAL #7   Title Pt will demo proper body mechanics to minimize straining pelvic floor and worsening prolapse    Time 2    Period Weeks    Status Achieved      PT LONG TERM GOAL #8   Title Pt will demo increased R LE strength from R knee flex/ext 3+/5 to > 4/5 in order to improve gait    Time 8    Period Weeks    Status Achieved      PT LONG TERM GOAL  #9   TITLE Pt will be IND with resistance band strengthening exercsies to build bone density, m inimize risk for injuries / worsening of prolpase    Time 10    Period Weeks    Status New    Target Date 04/17/20                 Plan - 02/07/20 1158    Clinical Impression Statement Across the past 10 visits, pt has achieved 5/9 goals and progressing well towards remaining goals.  Pt 's FOTO score for prolapse pts 25 pts to 0 pts , Urinary 49 pts to 8 pts. These indicate significant improvements. Pt reports her leakage has improved by 75%. Pt is sleeping through the night and not getting up as much to pee. Pt has  not had any accidents.  Pt's leg length difference has been addressed with a shoe lift which has helped to equally align her pelvic girdle and spine and decrease her knee pain. Strengthening exercises have helped to increase her BLE strength.  Manual Tx have helped minimize mm imbalance and curves to her spine. Currently working to improve her forward posture and thoracic  kyphosis which will help minimize of risk of injuries 2/2 osteopenia/porosis. Pt is learning modifications to resistance strengthening with bands and advised to not to use weight machines at the gym setting while prolapse remains an issue being addressed and with increased cases with COVID. Pt required cues for propioception and alignment for balance with resistance bands. Pt continues to benefit from skilled PT.               Personal Factors and Comorbidities Comorbidity 3+    Comorbidities prolapse of pelvic organs, abdominal hysterectomy, D & C due to marriage, gall bladder removal, osteoporosis (report in records reviewed) , R knee pain, Hx of CA.    Examination-Activity Limitations Squat;Stairs;Stand;Toileting;Sit    Stability/Clinical Decision Making Evolving/Moderate complexity    Rehab Potential Good    PT Frequency 1x / week    PT Duration Other (comment)   10   PT Treatment/Interventions Therapeutic activities;Patient/family education;Therapeutic exercise;Neuromuscular re-education;Moist Heat;Scar mobilization;Balance training;Manual techniques;Splinting;Taping;Gait training;Functional mobility training           Patient will benefit from skilled therapeutic intervention in order to improve the following deficits and impairments:  Decreased coordination, Decreased range of motion, Abnormal gait, Decreased endurance, Decreased safety awareness, Increased muscle spasms, Decreased balance, Decreased strength, Decreased mobility, Impaired sensation, Postural dysfunction, Improper body mechanics, Decreased scar mobility, Impaired flexibility, Hypomobility, Pain, Decreased knowledge of precautions  Visit Diagnosis: Muscle weakness (generalized)  Other abnormalities of gait and mobility  Chronic pain of right knee     Problem List Patient Active Problem List   Diagnosis Date Noted  . Cystocele with rectocele 05/05/2019  . Post-menopause on HRT (hormone replacement therapy)  05/05/2019  . OP (osteoporosis) 01/15/2017  . Arthritis, degenerative 01/15/2017  . Climacteric 01/15/2017  . Obesity (BMI 30.0-34.9) 05/02/2015  . Hypertension 12/06/2014  . Dyslipidemia 12/06/2014    Jerl Mina ,PT, DPT, E-RYT  02/07/2020, 1:41 PM  [place] MAIN [facility] SERVICES [address] [place], [place], [address] Phone: [phone]   Fax:  [phone]  Name: Zoe Alvarado MRN: [id number] Date of Birth: 1945-09-04

## 2020-02-07 NOTE — Patient Instructions (Signed)
Backward lunges facing door  with band at doorknob  2 mins . straight arms, shoulders down and back  Front knee in place above ankle, back foot and toes pointed forward, ( do not turn the hips and toes out)  Carry your center with you as you step to maintain 50% weight in both legs.     WALKING WITH RESISTANCE  ( last week)  BLUE Band at waist connected to doorknob Stepping forward normal length steps, planting mid and forefoot down, center of mass ( navel) leans forward slightly as if you were walking uphill 3-4 steps till band feels taut ( MAKE SURE THE DOOR IS LOCKED AND WON'T OPEN)   Stepping backwards, lower heel slowly, carry trunk and hips back as you step   Multifidis twist   Band is on doorknob: stand further away from door (facing perpendicular)   Twisting trunk without moving the hips and knees Hold band at the level of ribcage, elbows bent,shoulder blades roll back and down like squeezing a pencil under armpit    Exhale twist,.10-15 deg away from door without moving your hips/ knees. Continue to maintain equal weight through legs. Keep knee unlocked. 30 reps

## 2020-02-08 ENCOUNTER — Encounter: Payer: Medicare Other | Admitting: Physical Therapy

## 2020-02-10 ENCOUNTER — Telehealth: Payer: Self-pay | Admitting: Family Medicine

## 2020-02-13 ENCOUNTER — Other Ambulatory Visit: Payer: Self-pay

## 2020-02-13 ENCOUNTER — Ambulatory Visit (INDEPENDENT_AMBULATORY_CARE_PROVIDER_SITE_OTHER): Payer: Medicare Other

## 2020-02-13 DIAGNOSIS — Z23 Encounter for immunization: Secondary | ICD-10-CM | POA: Diagnosis not present

## 2020-02-15 ENCOUNTER — Encounter: Payer: Medicare Other | Admitting: Physical Therapy

## 2020-02-16 ENCOUNTER — Other Ambulatory Visit: Payer: Self-pay

## 2020-02-16 ENCOUNTER — Ambulatory Visit: Payer: Medicare Other | Admitting: Physical Therapy

## 2020-02-16 DIAGNOSIS — G8929 Other chronic pain: Secondary | ICD-10-CM

## 2020-02-16 DIAGNOSIS — R2689 Other abnormalities of gait and mobility: Secondary | ICD-10-CM | POA: Diagnosis not present

## 2020-02-16 DIAGNOSIS — M6281 Muscle weakness (generalized): Secondary | ICD-10-CM

## 2020-02-16 NOTE — Patient Instructions (Signed)
When using pillows to sleep reclined:  two are horizontal   top one is lengthwise  And the bottom of it is at low back not your neck    __   deep core level 1 ( with quick pelvic floor squeeze)      deep core level 2 ( with red band at thigh)   6 min     PELVIC FLOOR / KEGEL EXERCISES   Pelvic floor/ Kegel exercises are used to strengthen the muscles in the base of your pelvis that are responsible for supporting your pelvic organs and preventing urine/feces leakage. Based on your therapists recommendations, they can be performed while standing, sitting, or lying down. Imagine pelvic floor area as a diamond with pelvic landmarks: top =pubic bone, bottom tip=tailbone, sides=sitting bones (ischial tuberosities).    Make yourself aware of this muscle group by using these cues while coordinating your breath:  Inhale, feel pelvic floor diamond area lower like hammock towards your feet and ribcage/belly expanding. Pause. Let the exhale naturally and feel your belly sink, abdominal muscles hugging in around you and you may notice the pelvic diamond draws upward towards your head forming a umbrella shape. Give a squeeze during the exhalation like you are stopping the flow of urine. If you are squeezing the buttock muscles, try to give 50% less effort.   Common Errors:  Breath holding: If you are holding your breath, you may be bearing down against your bladder instead of pulling it up. If you belly bulges up while you are squeezing, you are holding your breath. Be sure to breathe gently in and out while exercising. Counting out loud may help you avoid holding your breath.  Accessory muscle use: You should not see or feel other muscle movement when performing pelvic floor exercises. When done properly, no one can tell that you are performing the exercises. Keep the buttocks, belly and inner thighs relaxed.  Overdoing it: Your muscles can fatigue and stop working for you if you over-exercise. You  may actually leak more or feel soreness at the lower abdomen or rectum.  YOUR HOME EXERCISE PROGRAM  LONG HOLDS:   Position:  reclined in car seat  Or on your reclined set up with pillows ( do not lift head to sit up, instead make sure to use the handle to raise the car seat up and keep spine/head relaxed to not place load on pelvic floor/ abdominal muscle)     Inhale and then exhale. Then squeeze the muscle and count aloud for 3 seconds. Rest with three long breaths. (Be sure to let belly sink in with exhales and not push outward)  Perform 5 repetitions, 3 times/day                      DECREASE DOWNWARD PRESSURE ON  YOUR PELVIC FLOOR, ABDOMINAL, LOW BACK MUSCLES       PRESERVE YOUR PELVIC HEALTH LONG-TERM   ** SQUEEZE pelvic floor BEFORE YOUR SNEEZE, COUGH, LAUGH   ** EXHALE BEFORE YOU RISE AGAINST GRAVITY (lifting, sit to stand, from squat to stand)   ** LOG ROLL OUT OF BED INSTEAD OF CRUNCH/SIT-UP    ___  When using your peddler  sit ontop of folded blankets to raise your height from the floor for more knee extensions  Place facing a door With red band , pull one end at a time like Nustep handrails

## 2020-02-16 NOTE — Therapy (Signed)
[place] MAIN [facility] SERVICES [address] [place], [place], [address] Phone: [phone]   Fax:  [phone]  Physical Therapy Treatment  Patient Details  Name: Zoe Alvarado MRN: [id number] Date of Birth: 1945-11-19 Referring Provider (PT): Rubie Maid    Encounter Date: 02/16/2020   PT End of Session - 02/16/20 1503    Visit Number 11    Number of Visits --    Date for PT Re-Evaluation 04/17/20   PN 02/07/20   PT Start Time 1500    PT Stop Time 1600    PT Time Calculation (min) 60 min    Activity Tolerance Patient tolerated treatment well           Past Medical History:  Diagnosis Date  . Acute non-recurrent maxillary sinusitis 03/20/2016  . Allergy   . 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 Cataract years  . Hyperlipidemia   . Hypertension   . Osteoporosis   . Pneumonia   . Prolapse of female pelvic organs     Past Surgical History:  Procedure Laterality Date  . ABDOMINAL HYSTERECTOMY    . CATARACT EXTRACTION W/PHACO Left 11/24/2019   Procedure: CATARACT EXTRACTION PHACO AND INTRAOCULAR LENS PLACEMENT ([place]) LEFT Tyler Deis;  Surgeon: Leandrew Koyanagi, MD;  Location: [facility];  Service: Ophthalmology;  Laterality: Left;  cde 16.31 [place] 1:39 ap 16.5   . CATARACT EXTRACTION W/PHACO Right 12/14/2019   Procedure: CATARACT EXTRACTION PHACO AND INTRAOCULAR LENS PLACEMENT ([facility]) RIGHT EYHANCE TORIC 3.77 00:41.5 901%;  Surgeon: Leandrew Koyanagi, MD;  Location: [place];  Service: Ophthalmology;  Laterality: Right;  . CHOLECYSTECTOMY    . DENTAL SURGERY     dental implants  . DILATION AND CURETTAGE OF UTERUS    . KNEE SURGERY     arthroscopy  . TONSILLECTOMY      There were no vitals filed for this visit.   Subjective Assessment - 02/16/20 1502    Subjective Pt reported no pain with new HEP. Pt has a question about Nu Step machine.    Pertinent History prolapse of pelvic organs, abdominal hysterectomy, D &  C due to marriage, gall bladder removal, osteoporosis (report in records reviewed) , R knee pain, Hx of CA. No LBP. No dyspareunia.    Patient Stated Goals to avoid surgery and improve continence and improve R knee pain                          Pelvic Floor Special Questions - 02/16/20 1613    External Perineal Exam through clothing: proper lengthening and activation with cues 3 sec, 5 reps              [facility] Adult PT Treatment/Exercise - 02/16/20 1614      Therapeutic Activites    Other Therapeutic Activities modified exercises for aerobic and strengthening       Neuro Re-ed    Neuro Re-ed Details  cued for endurance training                        PT Long Term Goals - 02/07/20 1158      PT LONG TERM GOAL #1   Title Pt will demo decreased time with 5 TST from 18 sec to < 17 sec in order to minimize falls    Time 4    Status On-going      PT LONG  TERM GOAL #2   Title Pt will report increased stool consistency to Type 4 100% of the time and no straining with all bowel movements in order to minimize lowered position of pelvic organs and improve continence    Time 6    Period Weeks    Status Partially Met      PT LONG TERM GOAL #3   Title Pt will be able to climb stairs in her house safely with less knee pain by 50% in order to get to her 2nd floor safely  ( 7/7  33%)    Time 4    Status Partially Met      PT LONG TERM GOAL #4   Title Pt will demo co-activation with pelvic floor in order to not leak with getting up from bed and sitting in order to improve continence    Time 6    Period Weeks    Status Achieved      PT LONG TERM GOAL #5   Title Pt will be compliant with shoe lift wear and demo levelled iliac crest and less spinal deviations in order to progress to gait training and deep core strengthening to minimize urinary incontinence    Time 2    Period Weeks    Status Achieved      Additional Long Term Goals   Additional Long Term  Goals Yes      PT LONG TERM GOAL #6   Title Pt will increase FOTO prolapse pts 25 pts, Urinary 49 pts with increased points of > 8 pts in order to improve QOL ( 7/20: 21 pts of change for both , 9/21: Prolapse 0 pts, Urinary 8pts)  )    Time 10    Period Weeks    Status Achieved      PT LONG TERM GOAL #7   Title Pt will demo proper body mechanics to minimize straining pelvic floor and worsening prolapse    Time 2    Period Weeks    Status Achieved      PT LONG TERM GOAL #8   Title Pt will demo increased R LE strength from R knee flex/ext 3+/5 to > 4/5 in order to improve gait    Time 8    Period Weeks    Status Achieved      PT LONG TERM GOAL  #9   TITLE Pt will be IND with resistance band strengthening exercsies to build bone density, m inimize risk for injuries / worsening of prolpase    Time 10    Period Weeks    Status New    Target Date 04/17/20                 Plan - 02/16/20 1503    Clinical Impression Statement Pt demo'd good coordination with pelvic floor with minor cues and was advanced to endurance strengthening. Modified pt's aerobic and hip strengthening exercises. Pt continues to benefit from skilled PT    Personal Factors and Comorbidities Comorbidity 3+    Comorbidities prolapse of pelvic organs, abdominal hysterectomy, D & C due to marriage, gall bladder removal, osteoporosis (report in records reviewed) , R knee pain, Hx of CA.    Examination-Activity Limitations Squat;Stairs;Stand;Toileting;Sit    Stability/Clinical Decision Making Evolving/Moderate complexity    Rehab Potential Good    PT Frequency 1x / week    PT Duration Other (comment)   10   PT Treatment/Interventions Therapeutic activities;Patient/family education;Therapeutic exercise;Neuromuscular re-education;Moist Heat;Scar mobilization;Balance training;Manual techniques;Splinting;Taping;Gait  training;Functional mobility training           Patient will benefit from skilled therapeutic  intervention in order to improve the following deficits and impairments:  Decreased coordination, Decreased range of motion, Abnormal gait, Decreased endurance, Decreased safety awareness, Increased muscle spasms, Decreased balance, Decreased strength, Decreased mobility, Impaired sensation, Postural dysfunction, Improper body mechanics, Decreased scar mobility, Impaired flexibility, Hypomobility, Pain, Decreased knowledge of precautions  Visit Diagnosis: Muscle weakness (generalized)  Other abnormalities of gait and mobility  Chronic pain of right knee     Problem List Patient Active Problem List   Diagnosis Date Noted  . Cystocele with rectocele 05/05/2019  . Post-menopause on HRT (hormone replacement therapy) 05/05/2019  . OP (osteoporosis) 01/15/2017  . Arthritis, degenerative 01/15/2017  . Climacteric 01/15/2017  . Obesity (BMI 30.0-34.9) 05/02/2015  . Hypertension 12/06/2014  . Dyslipidemia 12/06/2014    Jerl Mina 02/16/2020, 4:14 PM  [place] MAIN [facility] SERVICES [address] [place], [place], [address] Phone: [phone]   Fax:  [phone]  Name: KATRECE ROEDIGER MRN: [id number] Date of Birth: Feb 05, 1946

## 2020-02-17 ENCOUNTER — Other Ambulatory Visit: Payer: Self-pay

## 2020-02-17 DIAGNOSIS — I10 Essential (primary) hypertension: Secondary | ICD-10-CM

## 2020-02-21 ENCOUNTER — Telehealth: Payer: Self-pay

## 2020-02-21 ENCOUNTER — Ambulatory Visit: Payer: Medicare Other | Attending: Obstetrics and Gynecology | Admitting: Physical Therapy

## 2020-02-21 ENCOUNTER — Other Ambulatory Visit: Payer: Self-pay

## 2020-02-21 DIAGNOSIS — M533 Sacrococcygeal disorders, not elsewhere classified: Secondary | ICD-10-CM | POA: Diagnosis not present

## 2020-02-21 DIAGNOSIS — M25561 Pain in right knee: Secondary | ICD-10-CM | POA: Insufficient documentation

## 2020-02-21 DIAGNOSIS — R2689 Other abnormalities of gait and mobility: Secondary | ICD-10-CM

## 2020-02-21 DIAGNOSIS — M6281 Muscle weakness (generalized): Secondary | ICD-10-CM

## 2020-02-21 DIAGNOSIS — G8929 Other chronic pain: Secondary | ICD-10-CM

## 2020-02-21 DIAGNOSIS — I10 Essential (primary) hypertension: Secondary | ICD-10-CM

## 2020-02-21 MED ORDER — OLMESARTAN MEDOXOMIL 5 MG PO TABS: 5.0000 mg | ORAL_TABLET | Freq: Every day | ORAL | 3 refills | Status: DC

## 2020-02-21 NOTE — Patient Instructions (Addendum)
Stretch for pelvic floor   V- slides  "v heels slide away and then back toward buttocks and then rock knee to slight ,  slide heel along at 11 o clock away from buttocks   10 reps   On R side only   ___  PELVIC FLOOR / KEGEL EXERCISES   Pelvic floor/ Kegel exercises are used to strengthen the muscles in the base of your pelvis that are responsible for supporting your pelvic organs and preventing urine/feces leakage. Based on your therapist's recommendations, they can be performed while standing, sitting, or lying down. Imagine pelvic floor area as a diamond with pelvic landmarks: top =pubic bone, bottom tip=tailbone, sides=sitting bones (ischial tuberosities).    Make yourself aware of this muscle group by using these cues while coordinating your breath:  Inhale, feel pelvic floor diamond area lower like hammock towards your feet and ribcage/belly expanding. Pause. Let the exhale naturally and feel your belly sink, abdominal muscles hugging in around you and you may notice the pelvic diamond draws upward towards your head forming a umbrella shape. Give a squeeze during the exhalation like you are stopping the flow of urine. If you are squeezing the buttock muscles, try to give 50% less effort.   Common Errors:  Breath holding: If you are holding your breath, you may be bearing down against your bladder instead of pulling it up. If you belly bulges up while you are squeezing, you are holding your breath. Be sure to breathe gently in and out while exercising. Counting out loud may help you avoid holding your breath.  Accessory muscle use: You should not see or feel other muscle movement when performing pelvic floor exercises. When done properly, no one can tell that you are performing the exercises. Keep the buttocks, belly and inner thighs relaxed.  Overdoing it: Your muscles can fatigue and stop working for you if you over-exercise. You may actually leak more or feel soreness at the  lower abdomen or rectum.  YOUR HOME EXERCISE PROGRAM  LONG HOLDS:   Position: on back or seated do not lift head to sit up, instead make sure to use the handle to raise the car seat up and keep spine/head relaxed to not place load on pelvic floor/ abdominal muscle)     Inhale and then exhale. Then squeeze the muscle and count aloud for 3 seconds. Rest with three long breaths. (Be sure to let belly sink in with exhales and not push outward)  AVOID OVERTIGHTENING THE BACK RECTAL MUSCLES, FOCUS ON TENT/ SEQUENTIAL LIFT, IRIS OF EYE   Perform 5 repetitions, 3 times/day  SHORT HOLDS: Position: on back, sitting   Inhale and then exhale. Then squeeze the muscle.  (Be sure to let belly sink in with exhales and not push outward)  Perform 5 repetitions, 5  Times/day                      DECREASE DOWNWARD PRESSURE ON  YOUR PELVIC FLOOR, ABDOMINAL, LOW BACK MUSCLES       PRESERVE YOUR PELVIC HEALTH LONG-TERM   ** SQUEEZE pelvic floor BEFORE YOUR SNEEZE, COUGH, LAUGH   ** EXHALE BEFORE YOU RISE AGAINST GRAVITY (lifting, sit to stand, from squat to stand)   ** LOG ROLL OUT OF BED INSTEAD OF CRUNCH/SIT-UP    __  Wall elbow shoulder pressed chin tuck 5 sec holds x 10 reps

## 2020-02-21 NOTE — Therapy (Signed)
[place] MAIN [facility] SERVICES [address] [place], [place], [address] Phone: [phone]   Fax:  [phone]  Physical Therapy Treatment  Patient Details  Name: Zoe Alvarado MRN: [id number] Date of Birth: Dec 28, 1945 Referring Provider (PT): Rubie Maid    Encounter Date: 02/21/2020   PT End of Session - 02/21/20 1405    Visit Number 12    Date for PT Re-Evaluation 04/17/20   PN 02/07/20   PT Start Time 1300    PT Stop Time 1350    PT Time Calculation (min) 50 min    Activity Tolerance Patient tolerated treatment well           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   Cataract years   Hyperlipidemia    Hypertension    Osteoporosis    Pneumonia    Prolapse of female pelvic organs     Past Surgical History:  Procedure Laterality Date   ABDOMINAL HYSTERECTOMY     CATARACT EXTRACTION W/PHACO Left 11/24/2019   Procedure: CATARACT EXTRACTION PHACO AND INTRAOCULAR LENS PLACEMENT ([place]) LEFT Tyler Deis;  Surgeon: Leandrew Koyanagi, MD;  Location: [facility];  Service: Ophthalmology;  Laterality: Left;  cde 16.31 [place] 1:39 ap 16.5    CATARACT EXTRACTION W/PHACO Right 12/14/2019   Procedure: CATARACT EXTRACTION PHACO AND INTRAOCULAR LENS PLACEMENT ([facility]) RIGHT EYHANCE TORIC 3.77 00:41.5 901%;  Surgeon: Leandrew Koyanagi, MD;  Location: [place];  Service: Ophthalmology;  Laterality: Right;   CHOLECYSTECTOMY     DENTAL SURGERY     dental implants   DILATION AND CURETTAGE OF UTERUS     KNEE SURGERY     arthroscopy   TONSILLECTOMY      There were no vitals filed for this visit.   Subjective Assessment - 02/21/20 1314    Subjective Pt reported no difficulty with new HEP from past week    Pertinent History prolapse of pelvic organs, abdominal hysterectomy, D & C due to marriage, gall bladder removal,  osteoporosis (report in records reviewed) , R knee pain, Hx of CA. No LBP. No dyspareunia.    Patient Stated Goals to avoid surgery and improve continence and improve R knee pain              [facility] PT Assessment - 02/21/20 1312      Observation/Other Assessments   Observations forward head / thoracic kyphosis                       Pelvic Floor Special Questions - 02/21/20 1406    Pelvic Floor Internal Exam pt consented verbally without contraindications     Exam Type Vaginal    Palpation scar retrictions 6-9 o'clock 3rd layers, tightness at piriformis R ,   no tenderness    Strength good squeeze, good lift, able to hold agaisnt strong resistance   cued for more sequential             [facility] Adult PT Treatment/Exercise - 02/21/20 1407      Neuro Re-ed    Neuro Re-ed Details  cued for R sided pelvic floor lengthening, and cervicoscapular strengthening , modified from wall squats      Moist Heat Therapy   Number Minutes Moist Heat 5 Minutes    Moist Heat Location --   perineum  Manual Therapy   Internal Pelvic Floor STM/MWM at probelm areas noted in pelvic floor assessment                        PT Long Term Goals - 02/07/20 1158      PT LONG TERM GOAL #1   Title Pt will demo decreased time with 5 TST from 18 sec to < 17 sec in order to minimize falls    Time 4    Status On-going      PT LONG TERM GOAL #2   Title Pt will report increased stool consistency to Type 4 100% of the time and no straining with all bowel movements in order to minimize lowered position of pelvic organs and improve continence    Time 6    Period Weeks    Status Partially Met      PT LONG TERM GOAL #3   Title Pt will be able to climb stairs in her house safely with less knee pain by 50% in order to get to her 2nd floor safely  ( 7/7  33%)    Time 4    Status Partially Met      PT LONG TERM GOAL #4   Title Pt will demo co-activation with pelvic floor in order to  not leak with getting up from bed and sitting in order to improve continence    Time 6    Period Weeks    Status Achieved      PT LONG TERM GOAL #5   Title Pt will be compliant with shoe lift wear and demo levelled iliac crest and less spinal deviations in order to progress to gait training and deep core strengthening to minimize urinary incontinence    Time 2    Period Weeks    Status Achieved      Additional Long Term Goals   Additional Long Term Goals Yes      PT LONG TERM GOAL #6   Title Pt will increase FOTO prolapse pts 25 pts, Urinary 49 pts with increased points of > 8 pts in order to improve QOL ( 7/20: 21 pts of change for both , 9/21: Prolapse 0 pts, Urinary 8pts)  )    Time 10    Period Weeks    Status Achieved      PT LONG TERM GOAL #7   Title Pt will demo proper body mechanics to minimize straining pelvic floor and worsening prolapse    Time 2    Period Weeks    Status Achieved      PT LONG TERM GOAL #8   Title Pt will demo increased R LE strength from R knee flex/ext 3+/5 to > 4/5 in order to improve gait    Time 8    Period Weeks    Status Achieved      PT LONG TERM GOAL  #9   TITLE Pt will be IND with resistance band strengthening exercsies to build bone density, m inimize risk for injuries / worsening of prolpase    Time 10    Period Weeks    Status New    Target Date 04/17/20                 Plan - 02/21/20 1406    Clinical Impression Statement Pt progressed to seated pelvic floor endurance strengthening but required excessive cues for more circumferential and sequential contraction and less posterior contraction. Prolapse remains in  a more cranial position. Pt continues to benefit from skilled PT   Personal Factors and Comorbidities Comorbidity 3+    Comorbidities prolapse of pelvic organs, abdominal hysterectomy, D & C due to marriage, gall bladder removal, osteoporosis (report in records reviewed) , R knee pain, Hx of CA.     Examination-Activity Limitations Squat;Stairs;Stand;Toileting;Sit    Stability/Clinical Decision Making Evolving/Moderate complexity    Rehab Potential Good    PT Frequency 1x / week    PT Duration Other (comment)   10   PT Treatment/Interventions Therapeutic activities;Patient/family education;Therapeutic exercise;Neuromuscular re-education;Moist Heat;Scar mobilization;Balance training;Manual techniques;Splinting;Taping;Gait training;Functional mobility training           Patient will benefit from skilled therapeutic intervention in order to improve the following deficits and impairments:  Decreased coordination, Decreased range of motion, Abnormal gait, Decreased endurance, Decreased safety awareness, Increased muscle spasms, Decreased balance, Decreased strength, Decreased mobility, Impaired sensation, Postural dysfunction, Improper body mechanics, Decreased scar mobility, Impaired flexibility, Hypomobility, Pain, Decreased knowledge of precautions  Visit Diagnosis: Muscle weakness (generalized)  Other abnormalities of gait and mobility  Chronic pain of right knee     Problem List Patient Active Problem List   Diagnosis Date Noted   Cystocele with rectocele 05/05/2019   Post-menopause on HRT (hormone replacement therapy) 05/05/2019   OP (osteoporosis) 01/15/2017   Arthritis, degenerative 01/15/2017   Climacteric 01/15/2017   Obesity (BMI 30.0-34.9) 05/02/2015   Hypertension 12/06/2014   Dyslipidemia 12/06/2014    Jerl Mina ,PT, DPT, E-RYT  02/21/2020, 2:10 PM  [place] [facility] MAIN [facility] SERVICES [address] [place], [place], [address] Phone: [phone]   Fax:  [phone]  Name: AEVAH STANSBERY MRN: [id number] Date of Birth: 25-Oct-1945

## 2020-02-22 NOTE — Telephone Encounter (Signed)
Pt states she wants to wait for her Dec appt. If this is not ok please reach out to pt so she can explain her concern. Pt pts request.

## 2020-02-28 ENCOUNTER — Other Ambulatory Visit: Payer: Self-pay

## 2020-02-28 ENCOUNTER — Ambulatory Visit: Payer: Medicare Other | Admitting: Physical Therapy

## 2020-02-28 DIAGNOSIS — G8929 Other chronic pain: Secondary | ICD-10-CM

## 2020-02-28 DIAGNOSIS — M6281 Muscle weakness (generalized): Secondary | ICD-10-CM

## 2020-02-28 DIAGNOSIS — M25561 Pain in right knee: Secondary | ICD-10-CM

## 2020-02-28 DIAGNOSIS — R2689 Other abnormalities of gait and mobility: Secondary | ICD-10-CM

## 2020-02-28 NOTE — Patient Instructions (Signed)
Lying on back, knees bent    band under ballmounds  while laying on back w/ knees bent  "W" exercise  10 reps x 2 sets   Band is placed under feet, knees bent, feet are hip width apart Hold band with thumbs point out, keep upper arm and elbow touching the bed the whole time  - inhale and then exhale pull bands by bending elbows hands move in a "w"  (feel shoulder blades squeezing)   ________________  

## 2020-02-28 NOTE — Therapy (Signed)
[place] [facility] MAIN REHAB SERVICES [address] [place], [place], [address] Phone: [phone]   Fax:  [phone]  Physical Therapy Treatment  Patient Details  Name: Zoe Alvarado MRN: [id number] Date of Birth: 02/05/1946 Referring Provider (PT): Anika Cherry    Encounter Date: 02/28/2020   PT End of Session - 02/28/20 1420    Visit Number 13    Date for PT Re-Evaluation 04/17/20   PN 02/07/20   PT Start Time 1334    PT Stop Time 1430    PT Time Calculation (min) 56 min    Activity Tolerance Patient tolerated treatment well           Past Medical History:  Diagnosis Date  . Acute non-recurrent maxillary sinusitis 03/20/2016  . Allergy   . 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 Cataract years  . Hyperlipidemia   . Hypertension   . Osteoporosis   . Pneumonia   . Prolapse of female pelvic organs     Past Surgical History:  Procedure Laterality Date  . ABDOMINAL HYSTERECTOMY    . CATARACT EXTRACTION W/PHACO Left 11/24/2019   Procedure: CATARACT EXTRACTION PHACO AND INTRAOCULAR LENS PLACEMENT ([facility]) LEFT EYHANCE TORIC;  Surgeon: Brasington, Chadwick, MD;  Location: [facility];  Service: Ophthalmology;  Laterality: Left;  cde 16.31 [place] 1:39 ap 16.5   . CATARACT EXTRACTION W/PHACO Right 12/14/2019   Procedure: CATARACT EXTRACTION PHACO AND INTRAOCULAR LENS PLACEMENT ([facility]) RIGHT EYHANCE TORIC 3.77 00:41.5 901%;  Surgeon: Brasington, Chadwick, MD;  Location: [facility];  Service: Ophthalmology;  Laterality: Right;  . CHOLECYSTECTOMY    . DENTAL SURGERY     dental implants  . DILATION AND CURETTAGE OF UTERUS    . KNEE SURGERY     arthroscopy  . TONSILLECTOMY      There were no vitals filed for this visit.   Subjective Assessment - 02/28/20 1337    Subjective Pt reported she did not like the peddling exercise with bands. Pt notices aa difference in her neck and humpback.    Pertinent History prolapse of pelvic organs, abdominal  hysterectomy, D & C due to marriage, gall bladder removal, osteoporosis (report in records reviewed) , R knee pain, Hx of CA. No LBP. No dyspareunia.    Patient Stated Goals to avoid surgery and improve continence and improve R knee pain                             [facility] Adult PT Treatment/Exercise - 02/28/20 1422      Neuro Re-ed    Neuro Re-ed Details  cued for cervical scapular retraction/ depression resistance band       Moist Heat Therapy   Number Minutes Moist Heat 5 Minutes    Moist Heat Location --   neck and shoulders     Manual Therapy   Manual therapy comments distractions T3-10, interspinal mm, upper trap/ SCM/ scalenes STM/MWM, , anterior neck mm fascial releases                        PT Long Term Goals - 02/07/20 1158      PT LONG TERM GOAL #1   Title Pt will demo decreased time with 5 TST from 18 sec to < 17 sec in order to minimize falls    Time 4    Status On-going        PT LONG TERM GOAL #2   Title Pt will report increased stool consistency to Type 4 100% of the time and no straining with all bowel movements in order to minimize lowered position of pelvic organs and improve continence    Time 6    Period Weeks    Status Partially Met      PT LONG TERM GOAL #3   Title Pt will be able to climb stairs in her house safely with less knee pain by 50% in order to get to her 2nd floor safely  ( 7/7  33%)    Time 4    Status Partially Met      PT LONG TERM GOAL #4   Title Pt will demo co-activation with pelvic floor in order to not leak with getting up from bed and sitting in order to improve continence    Time 6    Period Weeks    Status Achieved      PT LONG TERM GOAL #5   Title Pt will be compliant with shoe lift wear and demo levelled iliac crest and less spinal deviations in order to progress to gait training and deep core strengthening to minimize urinary incontinence    Time 2    Period Weeks    Status Achieved       Additional Long Term Goals   Additional Long Term Goals Yes      PT LONG TERM GOAL #6   Title Pt will increase FOTO prolapse pts 25 pts, Urinary 49 pts with increased points of > 8 pts in order to improve QOL ( 7/20: 21 pts of change for both , 9/21: Prolapse 0 pts, Urinary 8pts)  )    Time 10    Period Weeks    Status Achieved      PT LONG TERM GOAL #7   Title Pt will demo proper body mechanics to minimize straining pelvic floor and worsening prolapse    Time 2    Period Weeks    Status Achieved      PT LONG TERM GOAL #8   Title Pt will demo increased R LE strength from R knee flex/ext 3+/5 to > 4/5 in order to improve gait    Time 8    Period Weeks    Status Achieved      PT LONG TERM GOAL  #9   TITLE Pt will be IND with resistance band strengthening exercsies to build bone density, m inimize risk for injuries / worsening of prolpase    Time 10    Period Weeks    Status New    Target Date 04/17/20                 Plan - 02/28/20 1420    Clinical Impression Statement Pt    Personal Factors and Comorbidities Comorbidity 3+    Comorbidities prolapse of pelvic organs, abdominal hysterectomy, D & C due to marriage, gall bladder removal, osteoporosis (report in records reviewed) , R knee pain, Hx of CA.    Examination-Activity Limitations Squat;Stairs;Stand;Toileting;Sit    Stability/Clinical Decision Making Evolving/Moderate complexity    Rehab Potential Good    PT Frequency 1x / week    PT Duration Other (comment)   10   PT Treatment/Interventions Therapeutic activities;Patient/family education;Therapeutic exercise;Neuromuscular re-education;Moist Heat;Scar mobilization;Balance training;Manual techniques;Splinting;Taping;Gait training;Functional mobility training           Patient will benefit from skilled therapeutic intervention in order to improve the following deficits   and impairments:  Decreased coordination, Decreased range of motion, Abnormal gait, Decreased  endurance, Decreased safety awareness, Increased muscle spasms, Decreased balance, Decreased strength, Decreased mobility, Impaired sensation, Postural dysfunction, Improper body mechanics, Decreased scar mobility, Impaired flexibility, Hypomobility, Pain, Decreased knowledge of precautions  Visit Diagnosis: Muscle weakness (generalized)  Other abnormalities of gait and mobility  Chronic pain of right knee     Problem List Patient Active Problem List   Diagnosis Date Noted  . Cystocele with rectocele 05/05/2019  . Post-menopause on HRT (hormone replacement therapy) 05/05/2019  . OP (osteoporosis) 01/15/2017  . Arthritis, degenerative 01/15/2017  . Climacteric 01/15/2017  . Obesity (BMI 30.0-34.9) 05/02/2015  . Hypertension 12/06/2014  . Dyslipidemia 12/06/2014    Yeung,Shin Yiing 02/28/2020, 2:24 PM  [place] [facility] MAIN REHAB SERVICES [address] [place], [place], [address] Phone: [phone]   Fax:  [phone]  Name: Zoe Alvarado MRN: [id number] Date of Birth: 08/07/1945   

## 2020-03-06 ENCOUNTER — Ambulatory Visit: Payer: Medicare Other | Admitting: Physical Therapy

## 2020-03-14 ENCOUNTER — Ambulatory Visit: Payer: Medicare Other | Admitting: Physical Therapy

## 2020-03-21 ENCOUNTER — Ambulatory Visit: Payer: Medicare Other | Admitting: Physical Therapy

## 2020-03-30 ENCOUNTER — Telehealth: Payer: Self-pay | Admitting: Emergency Medicine

## 2020-03-30 ENCOUNTER — Encounter: Payer: Medicare Other | Admitting: Physical Therapy

## 2020-03-30 NOTE — Telephone Encounter (Signed)
Currently taking 10 mg Olmesartan for BP. Taking half of a 20mg . Will be out of this medication next week. The new script says 5 mg she got #90. Do you want her to do 5mg  one a day or 2 of the 5 mg one a day. Please  advise

## 2020-04-02 NOTE — Telephone Encounter (Signed)
Per Dr. Carlynn Purl informed patient to take 5mg  of the olmesartan for one week starting on 04/03/20 and then come in for follow up visit.  Patient verbalized understanding and scheduled her follow up for 04/10/20 @ 9 AM

## 2020-04-06 ENCOUNTER — Encounter: Payer: Medicare Other | Admitting: Physical Therapy

## 2020-04-09 ENCOUNTER — Ambulatory Visit: Payer: Medicare Other | Attending: Obstetrics and Gynecology | Admitting: Physical Therapy

## 2020-04-09 ENCOUNTER — Other Ambulatory Visit: Payer: Self-pay

## 2020-04-09 DIAGNOSIS — G8929 Other chronic pain: Secondary | ICD-10-CM

## 2020-04-09 DIAGNOSIS — M6281 Muscle weakness (generalized): Secondary | ICD-10-CM | POA: Diagnosis not present

## 2020-04-09 DIAGNOSIS — M25561 Pain in right knee: Secondary | ICD-10-CM | POA: Insufficient documentation

## 2020-04-09 DIAGNOSIS — R2689 Other abnormalities of gait and mobility: Secondary | ICD-10-CM | POA: Diagnosis present

## 2020-04-09 NOTE — Therapy (Signed)
[place] MAIN [facility] SERVICES [address] [place], [place], [address] Phone: [phone]   Fax:  [phone]  Physical Therapy Treatment/ Progress Note reporting from 02/07/20 to 04/09/20 ( 14th session since eval)   Patient Details  Name: Zoe Alvarado MRN: [id number] Date of Birth: 07-Jan-1946 Referring Provider (PT): Rubie Maid    Encounter Date: 04/09/2020   PT End of Session - 04/09/20 1034    Visit Number 14    Date for PT Re-Evaluation 06/19/2020     PN 02/07/20   PT Start Time 1000    PT Stop Time 1055    PT Time Calculation (min) 55 min    Activity Tolerance Patient tolerated treatment well           Past Medical History:  Diagnosis Date  . Acute non-recurrent maxillary sinusitis 03/20/2016  . Allergy   . 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 Cataract years  . Hyperlipidemia   . Hypertension   . Osteoporosis   . Pneumonia   . Prolapse of female pelvic organs     Past Surgical History:  Procedure Laterality Date  . ABDOMINAL HYSTERECTOMY    . CATARACT EXTRACTION W/PHACO Left 11/24/2019   Procedure: CATARACT EXTRACTION PHACO AND INTRAOCULAR LENS PLACEMENT ([place]) LEFT Tyler Deis;  Surgeon: Leandrew Koyanagi, MD;  Location: [facility];  Service: Ophthalmology;  Laterality: Left;  cde 16.31 [place] 1:39 ap 16.5   . CATARACT EXTRACTION W/PHACO Right 12/14/2019   Procedure: CATARACT EXTRACTION PHACO AND INTRAOCULAR LENS PLACEMENT ([facility]) RIGHT EYHANCE TORIC 3.77 00:41.5 901%;  Surgeon: Leandrew Koyanagi, MD;  Location: [place];  Service: Ophthalmology;  Laterality: Right;  . CHOLECYSTECTOMY    . DENTAL SURGERY     dental implants  . DILATION AND CURETTAGE OF UTERUS    . KNEE SURGERY     arthroscopy  . TONSILLECTOMY      There were no vitals filed for this visit.   Subjective Assessment - 04/09/20 1009    Subjective Pt reported she noticed last week she was leaking more when she was waking up in the morning.  It is getting better now. Pt has been doing her kegels on her side. Pt does not put a pillow between her knees when performing any exercise on her side or when lying on her side .    Pertinent History prolapse of pelvic organs, abdominal hysterectomy, D & C due to marriage, gall bladder removal, osteoporosis (report in records reviewed) , R knee pain, Hx of CA. No LBP. No dyspareunia.    Patient Stated Goals to avoid surgery and improve continence and improve R knee pain              [facility] PT Assessment - 04/09/20 1014      Observation/Other Assessments   Observations less thoracic kyphosis , forward head       Single Leg Stance   Comments 2 sec without UE support,  with UE support 30 sec on BLE,  toe gripping      Sit to Stand   Comments 11 sec      Strength   Overall Strength Comments hip / knee flex/ ext, PF/DF/EV  4/5 B,   hip ext B 4+/5, hip abd 4-/5 B                         Pelvic Floor Special Questions - 04/09/20 1015  Pelvic Floor Internal Exam pt consented verbally without contraindications     Exam Type Vaginal    Palpation no scar restrictions 6-9 o'clock 3rd layers, no tightness at piriformis R ,   no tenderness    Strength good squeeze, good lift, able to hold agaisnt strong resistance   no cues requried    Strength # of reps 5    Strength # of seconds 4                          PT Long Term Goals - 04/09/20 1028      PT LONG TERM GOAL #1   Title Pt will demo decreased time with 5 TST from 18 sec to < 17 sec in order to minimize falls  ( 11/22: 11 sec)    Time 4    Status Achieved      PT LONG TERM GOAL #2   Title Pt will report increased stool consistency to Type 4 100% of the time and no straining with all bowel movements in order to minimize lowered position of pelvic organs and improve continence  ( 11/22:  50-60% of the time, soemtimes straining)    Time 6    Period Weeks    Status Partially Met      PT LONG TERM GOAL  #3   Title Pt will be able to climb stairs in her house safely with less knee pain by 50% in order to get to her 2nd floor safely  ( 7/7  33% , 11/22  45% )    Time 4    Status Partially Met      PT LONG TERM GOAL #4   Title Pt will demo co-activation with pelvic floor in order to not leak with getting up from bed and sitting in order to improve continence    Time 6    Period Weeks    Status Achieved      PT LONG TERM GOAL #5   Title Pt will be compliant with shoe lift wear and demo levelled iliac crest and less spinal deviations in order to progress to gait training and deep core strengthening to minimize urinary incontinence    Time 2    Period Weeks    Status Achieved      Additional Long Term Goals   Additional Long Term Goals Yes      PT LONG TERM GOAL #6   Title Pt will increase FOTO prolapse pts 25 pts, Urinary 49 pts with increased points of > 8 pts in order to improve QOL ( 7/20: 21 pts of change for both , 9/21: Prolapse 0 pts, Urinary 8pts)  )    Time 10    Period Weeks    Status Achieved      PT LONG TERM GOAL #7   Title Pt will demo proper body mechanics to minimize straining pelvic floor and worsening prolapse    Time 2    Period Weeks    Status Achieved      PT LONG TERM GOAL #8   Title Pt will demo increased R LE strength from R knee flex/ext 3+/5 to > 4/5 in order to improve gait    Time 8    Period Weeks    Status Achieved      PT LONG TERM GOAL  #9   TITLE Pt will be IND with resistance band strengthening exercsies to build bone density, minimize risk for  injuries / worsening of prolpase    Time 10    Period Weeks    Status On-going      PT LONG TERM GOAL  #10   TITLE Pt will report walking longer distances ( from mailbox to house on levelled ground ) with decreased knee pain by 75% in order walk longer distances and shop    Time 10    Period Weeks    Status New    Target Date 06/18/20                 Plan - 04/09/20 1055    Clinical  Impression Statement Pt has achieved 6/10 goals and progressing well towards remaining goals. Pt's bladder position and urinary Sx have improved. Pt's constipation and tight pelvic floor mm are still getting addressed.  Pt is progressing towards fitness routine training and requires cues and education on use of resistance bands instead of seated weight machines at a gym. Pt demo more strength along her lower kinetic chain and deep core system. Pt's R knee pain has improved and she is able to shop for longer hours.   Pt's length leg difference not longer impacts pt's gait / pain as she is compliant with wearing shoe lift and performing her HEP. Pt's forward head and thoracic kyphosis have improved significantly.    Pt continues to benefit from skilled PT.     Personal Factors and Comorbidities Comorbidity 3+    Comorbidities prolapse of pelvic organs, abdominal hysterectomy, D & C due to marriage, gall bladder removal, osteoporosis (report in records reviewed) , R knee pain, Hx of CA.    Examination-Activity Limitations Squat;Stairs;Stand;Toileting;Sit    Stability/Clinical Decision Making Evolving/Moderate complexity    Rehab Potential Good    PT Frequency 1x / week    PT Duration Other (comment)   10   PT Treatment/Interventions Therapeutic activities;Patient/family education;Therapeutic exercise;Neuromuscular re-education;Moist Heat;Scar mobilization;Balance training;Manual techniques;Splinting;Taping;Gait training;Functional mobility training           Patient will benefit from skilled therapeutic intervention in order to improve the following deficits and impairments:  Decreased coordination, Decreased range of motion, Abnormal gait, Decreased endurance, Decreased safety awareness, Increased muscle spasms, Decreased balance, Decreased strength, Decreased mobility, Impaired sensation, Postural dysfunction, Improper body mechanics, Decreased scar mobility, Impaired flexibility, Hypomobility,  Pain, Decreased knowledge of precautions  Visit Diagnosis: Muscle weakness (generalized)  Other abnormalities of gait and mobility  Chronic pain of right knee     Problem List Patient Active Problem List   Diagnosis Date Noted  . Cystocele with rectocele 05/05/2019  . Post-menopause on HRT (hormone replacement therapy) 05/05/2019  . OP (osteoporosis) 01/15/2017  . Arthritis, degenerative 01/15/2017  . Climacteric 01/15/2017  . Obesity (BMI 30.0-34.9) 05/02/2015  . Hypertension 12/06/2014  . Dyslipidemia 12/06/2014    Jerl Mina ,PT, DPT, E-RYT  04/09/2020, 11:00 AM  [place] MAIN [facility] SERVICES [address] [place], [place], [address] Phone: [phone]   Fax:  [phone]  Name: DALAYNA LAUTER MRN: [id number] Date of Birth: 07/22/1945

## 2020-04-09 NOTE — Patient Instructions (Addendum)
Pillow between knees when on your side ( doing exercises and when sleeping)   __  1) FINGER ON COUNTER -- STANDING ONE FOOT , without squeezing toes, or    30 sec , gazing straight ahead   Balance    2) heel raises single leg    3)  Backward step lounges  Front knee align with 2-3 toes, no toe gripping, spread ballmounds and toes  Back foot and heel straight, push off the ground to step forward, foot under hips, hip width apart\  10 reps     __  Wear toe spreader ( manicure) while doing standing balance and also when watching TV   __

## 2020-04-10 ENCOUNTER — Ambulatory Visit (INDEPENDENT_AMBULATORY_CARE_PROVIDER_SITE_OTHER): Payer: Medicare Other | Admitting: Family Medicine

## 2020-04-10 ENCOUNTER — Other Ambulatory Visit: Payer: Self-pay

## 2020-04-10 ENCOUNTER — Encounter: Payer: Self-pay | Admitting: Family Medicine

## 2020-04-10 VITALS — BP 122/72 | HR 92 | Temp 98.0°F | Resp 16 | Ht 59.0 in | Wt 146.7 lb

## 2020-04-10 DIAGNOSIS — I10 Essential (primary) hypertension: Secondary | ICD-10-CM

## 2020-04-10 DIAGNOSIS — M1991 Primary osteoarthritis, unspecified site: Secondary | ICD-10-CM

## 2020-04-10 DIAGNOSIS — M810 Age-related osteoporosis without current pathological fracture: Secondary | ICD-10-CM

## 2020-04-10 NOTE — Progress Notes (Signed)
Name: Zoe Alvarado   MRN: [id number]    DOB: 02-27-1946   Date:04/10/2020       Progress Note  Chief Complaint  Patient presents with  . Hypertension    follow up change in medication     Subjective:   Zoe Alvarado is a 74 y.o. female, presents to clinic for   F/up HTN  Past HPI on 10/31/2019: Currently managed on benicar 20 mg -she monitors her blood pressure occasionally, she is not having any side effects or symptoms of her medicines.  She brings in a paper of readings over the past 10 months roughly with blood pressure range from 90/50 -110's/70.   Very rare that she has a systolic blood pressure in the 120s She had been working with her PCP many years ago about decreasing her medicine dose and trying to get off medication.  She would still like to do that although her weight is up a little bit from her weight goal of 130 pounds.  1. Essential hypertension Well-controlled and stable with Benicar, she does have soft BPs frequently her average is low end of normal.  With her osteoporosis and osteoporotic fracture risk do feel that it would be appropriate to decrease her Benicar dose and make sure that her blood pressure is not too low or does not cause any lightheaded episodes or falls.  I discussed this with her at length and she would like to try and decrease meds or D/C meds if able. We reviewed her medicines and available prescriptions, she did just pick up a bottle of the 20 mg dose, she will cut this in half and take 10 mg of Benicar daily for the next month or so and monitor her blood pressures, encouraged her to reach out to me if she is staying at the 10 mg dose or would like me to send in a 5 mg tablet dose for her to try as well.  Feel that she will be well controlled for age if she remains between 100/60 to 130/80  Currently taking 5 mg benicar  TODAY:   Hypertension:  Currently managed on benicar 5 mg Pt reports good med compliance and denies any SE.   Blood pressure  today is well controlled.   She brings in several pages of blood pressure readings at lowest was 90's/60's and highest reading 136 systolic.  No lightheadedness or symptoms with the blood pressure reading in the 90s BP Readings from Last 3 Encounters:  04/10/20 122/72  12/14/19 111/78  11/24/19 125/74   Pt denies CP, SOB, exertional sx, LE edema, palpitation, Ha's, visual disturbances, lightheadedness, hypotension, syncope.  Seeing PT Dayle PointsYeung doing pelvic floor and other PT working with back issues, osteoporosis and posture -she feels this is helping a lot with her arthritic pain and GU symptoms  She is compliant with Fosamax taking once a week and administering correctly and she denies any dysphagia  She is taking total of 2000 IU vitamin D daily between her multivitamin and other supplements    Current Outpatient Medications:  .  alendronate (FOSAMAX) 70 MG tablet, Take 70 mg by mouth once a week. , Disp: , Rfl:  .  Calcium Carbonate-Vit D-Min (CALTRATE 600+D PLUS MINERALS) 600-800 MG-UNIT CHEW, Chew 2 tablets by mouth daily. chocolate chews, Disp: , Rfl:  .  cetirizine (ZYRTEC) 10 MG tablet, Take 3.33 mg by mouth daily as needed for allergies., Disp: , Rfl:  .  estradiol (ESTRACE) 0.5 MG tablet, Take 1  tablet (0.5 mg total) by mouth daily., Disp: 90 tablet, Rfl: 3 .  MULTIPLE VITAMINS-MINERALS PO, Take 1 tablet by mouth daily. Centrum Silver for Woman, Disp: , Rfl:  .  olmesartan (BENICAR) 5 MG tablet, Take 1 tablet (5 mg total) by mouth daily., Disp: 90 tablet, Rfl: 3 .  sodium chloride (OCEAN) 0.65 % SOLN nasal spray, Place 1 spray into both nostrils daily as needed for congestion., Disp: , Rfl:  .  UNABLE TO FIND, Take 1 tablet by mouth daily. Balance of Nature  Fruit and Vegetable Tablet, Disp: , Rfl:  .  acetaminophen (TYLENOL) 325 MG tablet, Take 325 mg by mouth every 6 (six) hours as needed for mild pain or moderate pain., Disp: , Rfl:  .  dextromethorphan (DELSYM) 30 MG/5ML liquid,  Take 7.5 mg by mouth daily as needed for cough. 1.25 ml  (Patient not taking: Reported on 04/10/2020), Disp: , Rfl:  .  Ibuprofen (ADVIL) 200 MG CAPS, Take 200 mg by mouth daily as needed (pain). Liquid Gel, Disp: , Rfl:  .  Vitamin D, Ergocalciferol, (DRISDOL) 1.25 MG (50000 UNIT) CAPS capsule, Take 1 capsule (50,000 Units total) by mouth every 7 (seven) days. x12 weeks., Disp: 12 capsule, Rfl: 0  Patient Active Problem List   Diagnosis Date Noted  . Cystocele with rectocele 05/05/2019  . Post-menopause on HRT (hormone replacement therapy) 05/05/2019  . OP (osteoporosis) 01/15/2017  . Arthritis, degenerative 01/15/2017  . Climacteric 01/15/2017  . Obesity (BMI 30.0-34.9) 05/02/2015  . Hypertension 12/06/2014  . Dyslipidemia 12/06/2014    Past Surgical History:  Procedure Laterality Date  . ABDOMINAL HYSTERECTOMY    . CATARACT EXTRACTION W/PHACO Left 11/24/2019   Procedure: CATARACT EXTRACTION PHACO AND INTRAOCULAR LENS PLACEMENT (IOC) LEFT [name];  Surgeon: Lockie Mola, MD;  Location: [facility];  Service: Ophthalmology;  Laterality: Left;  cde 16.31 [place] 1:39 ap 16.5   . CATARACT EXTRACTION W/PHACO Right 12/14/2019   Procedure: CATARACT EXTRACTION PHACO AND INTRAOCULAR LENS PLACEMENT ([facility]) RIGHT EYHANCE TORIC 3.77 00:41.5 901%;  Surgeon: Lockie Mola, MD;  Location: [facility];  Service: Ophthalmology;  Laterality: Right;  . CHOLECYSTECTOMY    . DENTAL SURGERY     dental implants  . DILATION AND CURETTAGE OF UTERUS    . KNEE SURGERY     arthroscopy  . TONSILLECTOMY      Family History  Problem Relation Age of Onset  . Breast cancer Maternal Aunt 40  . Breast cancer Maternal Grandmother   . Cancer Maternal Grandmother   . Diabetes Mother   . Heart disease Father   . Hypertension Father   . Prostate cancer Father   . Cancer Father   . Vision loss Father   . Diabetes Brother   . Cancer Maternal Aunt   . Ovarian cancer Neg Hx   . Colon cancer  Neg Hx     Social History   Tobacco Use  . Smoking status: Never Smoker  . Smokeless tobacco: Never Used  Vaping Use  . Vaping Use: Never used  Substance Use Topics  . Alcohol use: No    Alcohol/week: 0.0 standard drinks  . Drug use: No     Allergies  Allergen Reactions  . Codeine Rash    On face and shoulders  . Penicillins Rash    On face and should / in 1970    Health Maintenance  Topic Date Due  . Hepatitis C Screening  10/30/2020 (Originally December 29, 1945)  . MAMMOGRAM  02/14/2021  .  COLONOSCOPY  02/21/2023  . TETANUS/TDAP  04/23/2023  . INFLUENZA VACCINE  Completed  . DEXA SCAN  Completed  . COVID-19 Vaccine  Completed  . PNA vac Low Risk Adult  Completed    Chart Review Today: I personally reviewed active problem list, medication list, allergies, family history, social history, health maintenance, notes from last encounter, lab results, imaging with the patient/caregiver today.   Review of Systems  10 Systems reviewed and are negative for acute change except as noted in the HPI.  Objective:   Vitals:   04/10/20 0854  BP: 122/72  Pulse: 92  Resp: 16  Temp: 98 F (36.7 C)  TempSrc: Oral  SpO2: 98%  Weight: 146 lb 11.2 oz (66.5 kg)  Height: 4\' 11"  (1.499 m)    Body mass index is 29.63 kg/m.  Physical Exam Vitals and nursing note reviewed.  Constitutional:      General: She is not in acute distress.    Appearance: Normal appearance. She is well-developed and well-groomed. She is not ill-appearing, toxic-appearing or diaphoretic.     Interventions: Face mask in place.     Comments: Well appearing elderly female appears stated age, pleasant  HENT:     Head: Normocephalic and atraumatic.     Right Ear: External ear normal.     Left Ear: External ear normal.  Eyes:     General: Lids are normal. No scleral icterus.       Right eye: No discharge.        Left eye: No discharge.     Conjunctiva/sclera: Conjunctivae normal.  Neck:     Trachea:  Phonation normal. No tracheal deviation.  Cardiovascular:     Rate and Rhythm: Normal rate and regular rhythm.     Pulses: Normal pulses.          Radial pulses are 2+ on the right side and 2+ on the left side.       Posterior tibial pulses are 2+ on the right side and 2+ on the left side.     Heart sounds: Normal heart sounds. No murmur heard.  No friction rub. No gallop.   Pulmonary:     Effort: Pulmonary effort is normal. No respiratory distress.     Breath sounds: Normal breath sounds. No stridor. No wheezing, rhonchi or rales.  Chest:     Chest wall: No tenderness.  Abdominal:     General: Bowel sounds are normal. There is no distension.     Palpations: Abdomen is soft.  Musculoskeletal:     Right lower leg: No edema.     Left lower leg: No edema.  Skin:    General: Skin is warm and dry.     Coloration: Skin is not jaundiced or pale.     Findings: No rash.  Neurological:     Mental Status: She is alert.     Motor: No abnormal muscle tone.     Gait: Gait normal.  Psychiatric:        Mood and Affect: Mood normal.        Speech: Speech normal.        Behavior: Behavior normal. Behavior is cooperative.         Assessment & Plan:     ICD-10-CM   1. Essential hypertension  I10    Blood pressure is stable, well-controlled, BP at goal today with dose decrease of Benicar 20 mg down to 5 mg continues to be well controlled  2. Primary osteoarthritis,  unspecified site  M19.91    Doing PT and home exercises, staying active and doing exercises has improved her arthritic pain  3. Osteoporosis without current pathological fracture, unspecified osteoporosis type  M81.0    Compliant with Fosamax, supplements and doing weightbearing exercises    Pt asked to f/up prior to June 2022 - at that time she would like to do a CPE  [name], PA-C 04/10/20 9:12 AM

## 2020-04-18 ENCOUNTER — Encounter: Payer: Medicare Other | Admitting: Physical Therapy

## 2020-04-23 ENCOUNTER — Encounter: Payer: Medicare Other | Admitting: Physical Therapy

## 2020-04-25 ENCOUNTER — Ambulatory Visit: Payer: Medicare Other | Attending: Obstetrics and Gynecology | Admitting: Physical Therapy

## 2020-04-25 ENCOUNTER — Other Ambulatory Visit: Payer: Self-pay

## 2020-04-25 DIAGNOSIS — R2689 Other abnormalities of gait and mobility: Secondary | ICD-10-CM | POA: Diagnosis present

## 2020-04-25 DIAGNOSIS — M6281 Muscle weakness (generalized): Secondary | ICD-10-CM

## 2020-04-25 DIAGNOSIS — G8929 Other chronic pain: Secondary | ICD-10-CM

## 2020-04-25 DIAGNOSIS — M25561 Pain in right knee: Secondary | ICD-10-CM | POA: Insufficient documentation

## 2020-04-25 NOTE — Therapy (Signed)
[place] MAIN [facility] SERVICES [address] [place], [place], [address] Phone: [phone]   Fax:  [phone]  Physical Therapy Treatment / Discharge Summary   Patient Details  Name: Zoe Alvarado MRN: [id number] Date of Birth: [birth year] Referring Provider (PT): Rubie Maid    Encounter Date: 04/25/2020   PT End of Session - 04/25/20 1019    Visit Number 15    Date for PT Re-Evaluation --   PN 02/07/20   PT Start Time 1003    PT Stop Time 1100    PT Time Calculation (min) 57 min    Activity Tolerance Patient tolerated treatment well           Past Medical History:  Diagnosis Date  . Acute non-recurrent maxillary sinusitis 03/20/2016  . Allergy   . 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 Cataract years  . Hyperlipidemia   . Hypertension   . Osteoporosis   . Pneumonia   . Prolapse of female pelvic organs     Past Surgical History:  Procedure Laterality Date  . ABDOMINAL HYSTERECTOMY    . CATARACT EXTRACTION W/PHACO Left 11/24/2019   Procedure: CATARACT EXTRACTION PHACO AND INTRAOCULAR LENS PLACEMENT ([place]) LEFT Tyler Deis;  Surgeon: Leandrew Koyanagi, MD;  Location: [facility];  Service: Ophthalmology;  Laterality: Left;  cde 16.31 [place] 1:39 ap 16.5   . CATARACT EXTRACTION W/PHACO Right 12/14/2019   Procedure: CATARACT EXTRACTION PHACO AND INTRAOCULAR LENS PLACEMENT (IOC) RIGHT EYHANCE TORIC 3.77 00:41.5 901%;  Surgeon: Leandrew Koyanagi, MD;  Location: [place] [facility];  Service: Ophthalmology;  Laterality: Right;  . CHOLECYSTECTOMY    . DENTAL SURGERY     dental implants  . DILATION AND CURETTAGE OF UTERUS    . KNEE SURGERY     arthroscopy  . TONSILLECTOMY      There were no vitals filed for this visit.   Subjective Assessment - 04/25/20 1006    Subjective Pt reported leakage was better the past weeks. Pt is still doing ehr kegel exercises but not doing her band exercises because she twisted her L ankle on  Thanksgiving . Pt was very busy prepping and cooking food and she woke up in the middle of the night with lots of pain and could barely walk to the bathroom. Pain was 7/10 located on the outside and it swollen without coloration. Pt did not remember twisting it nor fell. It was a mystery what happened. Pt iced and elevated her ankle and pain decreased to 3/10.  Sometimes the pain would go to the inside ankle. Pt is able to walk on it now.  Pt did nto seek medical treatment. Pt plans to use [employer] program at the gym. Pt wants to walk around the track and use the NU step.    Pertinent History prolapse of pelvic organs, abdominal hysterectomy, D & C due to marriage, gall bladder removal, osteoporosis (report in records reviewed) , R knee pain, Hx of CA. No LBP. No dyspareunia.    Patient Stated Goals to avoid surgery and improve continence and improve R knee pain              [facility] PT Assessment - 04/25/20 1029      Observation/Other Assessments-Edema    Edema --   ankle figure 8 (52.5 cm R, 54.5 cmL)      Ambulation/Gait   Gait Comments antalgic gait  [facility] PT Treatment/Exercise - 04/25/20 1148      Therapeutic Activites    Other Therapeutic Activities discussed referral to podiatrist re: ankle pain.  Discussed continuation of HEP that does not load the ankle ( exercises on bed/ seated)       Neuro Re-ed    Neuro Re-ed Details  cued for use of NU step ( adjustment to minimize rounded shoulders and promote knee alignment                        PT Long Term Goals - 04/25/20 1012      PT LONG TERM GOAL #1   Title Pt will demo decreased time with 5 TST from 18 sec to < 17 sec in order to minimize falls  ( 11/22: 11 sec)    Time 4    Status Achieved      PT LONG TERM GOAL #2   Title Pt will report increased stool consistency to Type 4 100% of the time and no straining with all bowel movements in order to minimize lowered  position of pelvic organs and improve continence  ( 11/22:  50-60% of the time, soemtimes straining)    Time 6    Period Weeks    Status Partially Met      PT LONG TERM GOAL #3   Title Pt will be able to climb stairs in her house safely with less knee pain by 50% in order to get to her 2nd floor safely  ( 7/7  33% , 11/22  45% )    Time 4    Status Partially Met      PT LONG TERM GOAL #4   Title Pt will demo co-activation with pelvic floor in order to not leak with getting up from bed and sitting in order to improve continence    Time 6    Period Weeks    Status Achieved      PT LONG TERM GOAL #5   Title Pt will be compliant with shoe lift wear and demo levelled iliac crest and less spinal deviations in order to progress to gait training and deep core strengthening to minimize urinary incontinence    Time 2    Period Weeks    Status Achieved      PT LONG TERM GOAL #6   Title Pt will increase FOTO prolapse pts 25 pts, Urinary 49 pts with increased points of > 8 pts in order to improve QOL ( 7/20: 21 pts of change for both , 9/21: Prolapse 0 pts, Urinary 8pts  12/8: 4 pts  )    Time 10    Period Weeks    Status Achieved      PT LONG TERM GOAL #7   Title Pt will demo proper body mechanics to minimize straining pelvic floor and worsening prolapse    Time 2    Period Weeks    Status Achieved      PT LONG TERM GOAL #8   Title Pt will demo increased R LE strength from R knee flex/ext 3+/5 to > 4/5 in order to improve gait    Time 8    Period Weeks    Status Achieved      PT LONG TERM GOAL  #9   TITLE Pt will be IND with resistance band strengthening exercsies to build bone density, minimize risk for injuries / worsening of prolpase    Time 10  Period Weeks    Status Partially Met      PT LONG TERM GOAL  #10   TITLE Pt will report walking longer distances ( from mailbox to house on levelled ground ) with decreased knee pain by 75% in order walk longer distances and shop  (  40% less knee pain)    Time 10    Period Weeks    Status Partially Met                 Plan - 04/25/20 1019    Clinical Impression Statement Pt achieved 6/10 goals. Pt was advancing towards remaining goals related to her R knee pain.  Functional improvements: _able to walk longer and shop and up/downstairs with less knee pain _able to make it to the bathroom with less urgency/ incontinence _less prolapse sensations _increased sit to stand time which includes less risk for falls _returned to safe fitness routine with resistance bands instead of using machines which can cause worsening of prolapse    Improvements include: _levelled pelvic girdle w/ shoe lift to account for leg length difference _deep core and pelvic floor strengthening and coordination _stronger back and neck muscles to minimize thoracic kyphosis/ forward head posture  _less genu valgus on shorter leg to help lessen knee pain _more equal weightbearing on BLE and feet    POC is ending as pt incurred an L ankle injury on 04/12/20. Pt has been referred to see a podiatrist as pt has had Hx of fractures and osteoporosis and this pt noticed it hurting in the middle of the night and did not recall a particular cause for injury. It has decreased in swollen and pt is able to walk on it. Assessment today showed she maintained DF/EV strength and showed 2 cm difference in swelling by L lateral malleoli. Pt was informed to perform her past exercises that do not load her ankle ( I.e. exercises that are performed in bed or seated in order to continue improving her posture). Pt being discharged at this time.      Personal Factors and Comorbidities Comorbidity 3+    Comorbidities prolapse of pelvic organs, abdominal hysterectomy, D & C due to marriage, gall bladder removal, osteoporosis (report in records reviewed) , R knee pain, Hx of CA.    Examination-Activity Limitations Squat;Stairs;Stand;Toileting;Sit    Stability/Clinical  Decision Making Evolving/Moderate complexity    Rehab Potential Good    PT Frequency 1x / week    PT Duration Other (comment)   10   PT Treatment/Interventions Therapeutic activities;Patient/family education;Therapeutic exercise;Neuromuscular re-education;Moist Heat;Scar mobilization;Balance training;Manual techniques;Splinting;Taping;Gait training;Functional mobility training           Patient will benefit from skilled therapeutic intervention in order to improve the following deficits and impairments:  Decreased coordination, Decreased range of motion, Abnormal gait, Decreased endurance, Decreased safety awareness, Increased muscle spasms, Decreased balance, Decreased strength, Decreased mobility, Impaired sensation, Postural dysfunction, Improper body mechanics, Decreased scar mobility, Impaired flexibility, Hypomobility, Pain, Decreased knowledge of precautions  Visit Diagnosis: Muscle weakness (generalized)  Other abnormalities of gait and mobility  Chronic pain of right knee     Problem List Patient Active Problem List   Diagnosis Date Noted  . Cystocele with rectocele 05/05/2019  . Post-menopause on HRT (hormone replacement therapy) 05/05/2019  . OP (osteoporosis) 01/15/2017  . Arthritis, degenerative 01/15/2017  . Climacteric 01/15/2017  . Obesity (BMI 30.0-34.9) 05/02/2015  . Hypertension 12/06/2014  . Dyslipidemia 12/06/2014    Jerl Mina ,PT, DPT, E-RYT  04/25/2020, 12:43 PM  [place] MAIN [facility] SERVICES [address] [place], [place], [address] Phone: [phone]   Fax:  [phone]  Name: Zoe Alvarado MRN: [id number] Date of Birth: 1945/06/20

## 2020-04-27 ENCOUNTER — Ambulatory Visit: Payer: Self-pay | Admitting: *Deleted

## 2020-04-27 NOTE — Telephone Encounter (Signed)
I returned pt's call.   She answered  And said,  "I'll call you back I have a family emergency".   "Is that ok?"   "I'll have to call you back".     I said,  "That's fine". She got off the phone quickly.

## 2020-04-30 ENCOUNTER — Ambulatory Visit: Payer: Self-pay

## 2020-04-30 ENCOUNTER — Ambulatory Visit: Payer: Medicare Other | Admitting: Physical Therapy

## 2020-04-30 NOTE — Telephone Encounter (Signed)
Patient called and says on Thanksgiving her left foot was swollen with pain and unable to walk on it. She says she doesn't remember any injury, but was on her feet a lot the day before preparing for Thanksgiving. She says she called on that Friday, but had to hang up due to a family emergency and never called back. She says that she's in physical therapy and the therapist told her to follow up with her doctor because the ankle is painful moving certain ways and is still a little swollen. She says the pain is mild and no other symptoms, able to bear weight and walk. She asked would a specialist, orthopedic or podiatrist, be the best place to go. I advised to start with the PCP, then they can advise. Appointment scheduled for Wednesday, 05/02/20 at 0800 with [name], PA-C, care advice given, patient verbalized understanding.  Reason for Disposition . [1] After 2 weeks AND [2] still painful or swollen  Answer Assessment - Initial Assessment Questions 1. MECHANISM: "How did the injury happen?" (e.g., twisting injury, direct blow)      Unsure 2. ONSET: "When did the injury happen?" (Minutes or hours ago)      The day before Thanksgiving 3. LOCATION: "Where is the injury located?"      Left ankle 4. APPEARANCE of INJURY: "What does the injury look like?"      Swollen 5. WEIGHT-BEARING: "Can you put weight on that foot?" "Can you walk (four steps or more)?"       Yes 6. SIZE: For cuts, bruises, or swelling, ask: "How large is it?" (e.g., inches or centimeters;  entire joint)      No 7. PAIN: "Is there pain?" If Yes, ask: "How bad is the pain?"    (e.g., Scale 1-10; or mild, moderate, severe)   - NONE (0): no pain.   - MILD (1-3): doesn't interfere with normal activities.    - MODERATE (4-7): interferes with normal activities (e.g., work or school) or awakens from sleep, limping.    - SEVERE (8-10): excruciating pain, unable to do any normal activities, unable to walk.      2-3 8. TETANUS: For  any breaks in the skin, ask: "When was the last tetanus booster?"     N/A 9. OTHER SYMPTOMS: "Do you have any other symptoms?"      No 10. PREGNANCY: "Is there any chance you are pregnant?" "When was your last menstrual period?"      No  Protocols used: ANKLE AND FOOT INJURY-A-AH

## 2020-05-01 ENCOUNTER — Ambulatory Visit: Payer: Medicare Other | Admitting: Family Medicine

## 2020-05-02 ENCOUNTER — Ambulatory Visit: Payer: Self-pay | Admitting: Family Medicine

## 2020-05-07 ENCOUNTER — Encounter: Payer: Medicare Other | Admitting: Physical Therapy

## 2020-05-14 ENCOUNTER — Encounter: Payer: Medicare Other | Admitting: Physical Therapy

## 2020-05-15 ENCOUNTER — Telehealth: Payer: Self-pay

## 2020-05-15 ENCOUNTER — Ambulatory Visit
Admission: RE | Admit: 2020-05-15 | Discharge: 2020-05-15 | Disposition: A | Discharge status: Home / Self Care | Payer: Medicare Other | Attending: Family Medicine | Admitting: Family Medicine

## 2020-05-15 ENCOUNTER — Ambulatory Visit (INDEPENDENT_AMBULATORY_CARE_PROVIDER_SITE_OTHER): Payer: Medicare Other | Admitting: Family Medicine

## 2020-05-15 ENCOUNTER — Ambulatory Visit
Admission: RE | Admit: 2020-05-15 | Discharge: 2020-05-15 | Disposition: A | Discharge status: Home / Self Care | Payer: Medicare Other | Source: Ambulatory Visit | Attending: Family Medicine | Admitting: Family Medicine

## 2020-05-15 ENCOUNTER — Encounter: Payer: Self-pay | Admitting: Family Medicine

## 2020-05-15 ENCOUNTER — Other Ambulatory Visit: Payer: Self-pay

## 2020-05-15 VITALS — BP 120/76 | HR 80 | Temp 97.9°F | Resp 16 | Ht 59.0 in | Wt 146.5 lb

## 2020-05-15 DIAGNOSIS — M25572 Pain in left ankle and joints of left foot: Secondary | ICD-10-CM | POA: Insufficient documentation

## 2020-05-15 DIAGNOSIS — R600 Localized edema: Secondary | ICD-10-CM | POA: Diagnosis not present

## 2020-05-15 DIAGNOSIS — M25472 Effusion, left ankle: Secondary | ICD-10-CM | POA: Diagnosis not present

## 2020-05-15 MED ORDER — MELOXICAM 7.5 MG PO TABS: 7.5000 mg | ORAL_TABLET | Freq: Every day | ORAL | 1 refills | Status: AC | PRN

## 2020-05-15 NOTE — Telephone Encounter (Signed)
Added to medication list

## 2020-05-15 NOTE — Patient Instructions (Signed)
RICE Therapy for Routine Care of Injuries Many injuries can be cared for with rest, ice, compression, and elevation (RICE therapy). This includes:  Resting the injured part.  Putting ice on the injury.  Putting pressure (compression) on the injury.  Raising the injured part (elevation). Using RICE therapy can help to lessen pain and swelling. Supplies needed:  Ice.  Plastic bag.  Towel.  Elastic bandage.  Pillow or pillows to raise (elevate) your injured body part. How to care for your injury with RICE therapy Rest Limit your normal activities, and try not to use the injured part of your body. You can go back to your normal activities when your doctor says it is okay to do them and you feel okay. Ask your doctor if you should do exercises to help your injury get better. Ice Put ice on the injured area. Do not put ice on your bare skin.  Put ice in a plastic bag.  Place a towel between your skin and the bag.  Leave the ice on for 20 minutes, 2-3 times a day. Use ice on as many days as told by your doctor.  Compression Compression means putting pressure on the injured area. This can be done with an elastic bandage. If an elastic bandage has been put on your injury:  Do not wrap the bandage too tight. Wrap the bandage more loosely if part of your body away from the bandage is blue, swollen, cold, painful, or loses feeling (gets numb).  Take off the bandage and put it on again. Do this every 3-4 hours or as told by your doctor.  See your doctor if the bandage seems to make your problems worse.  Elevation Elevation means keeping the injured area raised. If you can, raise the injured area above your heart or the center of your chest. Contact a doctor if:  You keep having pain and swelling.  Your symptoms get worse. Get help right away if:  You have sudden bad pain at your injury or lower than your injury.  You have redness or more swelling around your injury.  You  have tingling or numbness at your injury or lower than your injury, and it does not go away when you take off the bandage. Summary  Many injuries can be cared for using rest, ice, compression, and elevation (RICE therapy).  You can go back to your normal activities when you feel okay and your doctor says it is okay.  Put ice on the injured area as told by your doctor.  Get help if your symptoms get worse or if you keep having pain and swelling. This information is not intended to replace advice given to you by your health care provider. Make sure you discuss any questions you have with your health care provider. Document Revised: 01/23/2017 Document Reviewed: 01/23/2017 Elsevier Patient Education  2020 [employer]      Ankle Sprain  An ankle sprain is a stretch or tear in a ligament in the ankle. Ligaments are tissues that connect bones to each other. The two most common types of ankle sprains are:  Inversion sprain. This happens when the foot turns inward and the ankle rolls outward. It affects the ligament on the outside of the foot (lateral ligament).  Eversion sprain. This happens when the foot turns outward and the ankle rolls inward. It affects the ligament on the inner side of the foot (medial ligament). What are the causes? This condition is often caused by accidentally rolling  or twisting the ankle. What increases the risk? You are more likely to develop this condition if you play sports. What are the signs or symptoms? Symptoms of this condition include:  Pain in your ankle.  Swelling.  Bruising. This may develop right after you sprain your ankle or 1-2 days later.  Trouble standing or walking, especially when you turn or change directions. How is this diagnosed? This condition is diagnosed with:  A physical exam. During the exam, your health care provider will press on certain parts of your foot and ankle and try to move them in certain ways.  X-ray imaging.  These may be taken to see how severe the sprain is and to check for broken bones. How is this treated? This condition may be treated with:  A brace or splint. This is used to keep the ankle from moving until it heals.  An elastic bandage. This is used to support the ankle.  Crutches.  Pain medicine.  Surgery. This may be needed if the sprain is severe.  Physical therapy. This may help to improve the range of motion in the ankle. Follow these instructions at home: If you have a brace or a splint:  Wear the brace or splint as told by your health care provider. Remove it only as told by your health care provider.  Loosen the brace or splint if your toes tingle, become numb, or turn cold and blue.  Keep the brace or splint clean.  If the brace or splint is not waterproof: ? Do not let it get wet. ? Cover it with a watertight covering when you take a bath or a shower. If you have an elastic bandage (dressing):  Remove it to shower or bathe.  Try not to move your ankle much, but wiggle your toes from time to time. This helps to prevent swelling.  Adjust the dressing to make it more comfortable if it feels too tight.  Loosen the dressing if you have numbness or tingling in your foot, or if your foot becomes cold and blue. Managing pain, stiffness, and swelling   Take over-the-counter and prescription medicines only as told by your health care provider.  For 2-3 days, keep your ankle raised (elevated) above the level of your heart as much as possible.  If directed, put ice on the injured area: ? If you have a removable brace or splint, remove it as told by your health care provider. ? Put ice in a plastic bag. ? Place a towel between your skin and the bag. ? Leave the ice on for 20 minutes, 2-3 times a day. General instructions  Rest your ankle.  Do not use the injured limb to support your body weight until your health care provider says that you can. Use crutches as told  by your health care provider.  Do not use any products that contain nicotine or tobacco, such as cigarettes, e-cigarettes, and chewing tobacco. If you need help quitting, ask your health care provider.  Keep all follow-up visits as told by your health care provider. This is important. Contact a health care provider if:  You have rapidly increasing bruising or swelling.  Your pain is not relieved with medicine. Get help right away if:  Your foot or toes become numb or blue.  You have severe pain that gets worse. Summary  An ankle sprain is a stretch or tear in a ligament in the ankle. Ligaments are tissues that connect bones to each other.  This  condition is often caused by accidentally rolling or twisting the ankle.  Symptoms include pain, swelling, bruising, and trouble walking.  To relieve pain and swelling, put ice on the affected ankle, raise your ankle above the level of your heart, and use an elastic bandage.  Keep all follow-up visits as told by your health care provider. This is important. This information is not intended to replace advice given to you by your health care provider. Make sure you discuss any questions you have with your health care provider. Document Revised: 01/25/2018 Document Reviewed: 09/29/2017 Elsevier Patient Education  2020 [employer].

## 2020-05-15 NOTE — Telephone Encounter (Signed)
Copied from CRM [id number]. Topic: General - Inquiry >> May 15, 2020 11:25 AM Adrian Prince D wrote: Reason for CRM: Patient was seen this morning and forgot to include one medication she is currently taking. She is also taking Fosamax 70mg  which was prescribed by her doctor at the [facility]. Please advise

## 2020-05-15 NOTE — Progress Notes (Signed)
Patient ID: Zoe Alvarado, female    DOB: [birth year], 74 y.o.   MRN: [id number]  PCP: Zoe Berry, PA-C  Chief Complaint  Patient presents with  . Joint Swelling    Left ankle pain and swelling for 1 month    Subjective:   Zoe Alvarado is a 74 y.o. 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, swelling and careful how she walks with right knee, using left leg first For years she has had some soreness to left ankle, but never this bad   Right knee scope in the past, injections with gel - all done 15-16 years ago in [place], trying to live with the pain, she has not had a orthopedic evaluation locally and she was hesitant to have a joint replacement or to get "addicted to any medications"  PT - wedge in right foot and doing band stretches  Her left ankle pain and swelling there has been no associated redness, brusing, heat She has fairly good range of motion, seems to have more swelling and pain with more use, seems to be low bit better when she is able to rest it more  +swelling pain, flares up  advil prn, helps a little  She is using a compression ankle sleeve  She is concerned with history of arthritis in several different places, history of osteoporosis and is on Fosamax  With her right knee osteoarthritis she states that she has not had any progression of her symptoms and she has not been using her left side anymore over the last several months.  About 3 months ago she did have the wedge put in her right shoe from physical therapy but she has not noticed any change to her chronic pain, gait or mobility in the past several months  She reports a few years of intermittent left ankle pain, but is usually very mild and not associated with swelling and nothing as bad as what she is experienced the past month   Patient Active Problem List   Diagnosis  Date Noted  . Cystocele with rectocele 05/05/2019  . Post-menopause on HRT (hormone replacement therapy) 05/05/2019  . OP (osteoporosis) 01/15/2017  . Arthritis, degenerative 01/15/2017  . Climacteric 01/15/2017  . Obesity (BMI 30.0-34.9) 05/02/2015  . Hypertension 12/06/2014  . Dyslipidemia 12/06/2014      Current Outpatient Medications:  .  Calcium Carbonate-Vit D-Min (CALTRATE 600+D PLUS MINERALS) 600-800 MG-UNIT CHEW, Chew 2 tablets by mouth daily. chocolate chews, Disp: , Rfl:  .  cetirizine (ZYRTEC) 10 MG tablet, Take 3.33 mg by mouth daily as needed for allergies., Disp: , Rfl:  .  estradiol (ESTRACE) 0.5 MG tablet, Take 1 tablet (0.5 mg total) by mouth daily., Disp: 90 tablet, Rfl: 3 .  MULTIPLE VITAMINS-MINERALS PO, Take 1 tablet by mouth daily. Centrum Silver for Woman, Disp: , Rfl:  .  olmesartan (BENICAR) 5 MG tablet, Take 1 tablet (5 mg total) by mouth daily., Disp: 90 tablet, Rfl: 3 .  sodium chloride (OCEAN) 0.65 % SOLN nasal spray, Place 1 spray into both nostrils daily as needed for congestion., Disp: , Rfl:  .  UNABLE TO FIND, Take 1 tablet by mouth daily. Balance of Nature  Fruit and Vegetable Tablet (Patient not taking: Reported on 05/15/2020), Disp: , Rfl:    Allergies  Allergen Reactions  .  Codeine Rash    On face and shoulders  . Penicillins Rash    On face and should / in 1970     Social History   Tobacco Use  . Smoking status: Never Smoker  . Smokeless tobacco: Never Used  Vaping Use  . Vaping Use: Never used  Substance Use Topics  . Alcohol use: No    Alcohol/week: 0.0 standard drinks  . Drug use: No      Chart Review Today: I personally reviewed active problem list, medication list, allergies, family history, social history, health maintenance, notes from last encounter, lab results, imaging with the patient/caregiver today.   Review of Systems 10 Systems reviewed and are negative for acute change except as noted in the HPI. She denies  any history of inflammatory arthritis or gout    Objective:   Vitals:   05/15/20 0807  BP: 120/76  Pulse: 80  Resp: 16  Temp: 97.9 F (36.6 C)  TempSrc: Oral  SpO2: 98%  Weight: 146 lb 8 oz (66.5 kg)  Height: 4\' 11"  (1.499 m)    Body mass index is 29.59 kg/m.  Physical Exam Vitals and nursing note reviewed.  Constitutional:      General: She is not in acute distress.    Appearance: Normal appearance. She is well-developed. She is not ill-appearing, toxic-appearing or diaphoretic.  HENT:     Head: Normocephalic and atraumatic.     Right Ear: External ear normal.     Left Ear: External ear normal.  Eyes:     General: No scleral icterus.       Right eye: No discharge.        Left eye: No discharge.     Conjunctiva/sclera: Conjunctivae normal.  Neck:     Trachea: No tracheal deviation.  Cardiovascular:     Rate and Rhythm: Normal rate and regular rhythm.  Pulmonary:     Effort: Pulmonary effort is normal. No respiratory distress.     Breath sounds: No stridor.  Musculoskeletal:     Right knee: Swelling present. Decreased range of motion.     Right lower leg: No swelling. No edema.     Left lower leg: No swelling. No edema.     Left ankle: Swelling present. No deformity, ecchymosis or lacerations. No tenderness. Normal range of motion. Anterior drawer test negative. Normal pulse.     Left Achilles Tendon: No tenderness or defects. Thompson's test negative.     Left foot: Normal range of motion and normal capillary refill. Swelling present. No tenderness, bony tenderness or crepitus. Normal pulse.     Comments: Bilateral lower legs -calf circumference bilaterally measured with less than 1 cm discrepancy right calf slightly larger than the left No tenderness, edema, no popliteal fossa tenderness or palpable cord Left ankle diffusely swollen/edematous without any focal tenderness, deformity or bony tenderness, no erythema or ecchymosis -good left ankle range of motion Left  foot with some mild edema more proximally and none to the dorsum of the foot or to toes  Skin:    General: Skin is warm and dry.     Coloration: Skin is not jaundiced or pale.     Findings: No rash.  Neurological:     Mental Status: She is alert. Mental status is at baseline.     Motor: No abnormal muscle tone.     Coordination: Coordination normal.     Gait: Gait abnormal.  Psychiatric:        Mood and Affect:  Mood and affect and mood normal.        Behavior: Behavior normal.      Results for orders placed or performed during the hospital encounter of 12/12/19  SARS CORONAVIRUS 2 (TAT 6-24 HRS) Nasopharyngeal Nasopharyngeal Swab   Specimen: Nasopharyngeal Swab  Result Value Ref Range   SARS Coronavirus 2 NEGATIVE NEGATIVE       Assessment & Plan:   1. Acute left ankle pain I suspect the patient has got some arthritis or osteoarthritis and some mild overuse causing her pain and inflammation to her left ankle, due to history of osteoporosis and explained that patient could go for an x-ray to screen for any stress fractures however we can try and get her into orthopedics very quickly in the next 1 to 2 weeks and it may be appropriate to wait and defer imaging to the specialist. I did put in the order and explained that she could get the x-ray if there was a delay getting into a specialist Since she had no injury and she has been ambulatory without any deformity or decreased range of motion, there is no emergent need to get the x-ray today and a specialist may want to do some specialized film such as weightbearing images etc.  She does have a over-the-counter compression ankle sleeve it does help with her pain and inflammation she is only not been using it because it does not fit in some of her shoes very well, she denies any decreased range of motion but with increased use she does have more swelling and with some resting it does improve.  I encouraged her to continue to use compression  and to do RICE therapy-if she feels any increased pain or instability encouraged her to get a ankle ASO brace for more support and compression and it would help immobilize her that joint more  Encouraged her to ice elevate rest continue using her brace or compression sleeve  She can try Mobic for her joint pain -she was having effective pain relief with NSAIDs but they do irritate her stomach, encouraged her to try Mobic if she felt comfortable however she is hesitant because her husband is sensitive to Mobic  We will try and get her into Ortho for evaluation of her ankle pain - DG Ankle Complete Left; Future - meloxicam (MOBIC) 7.5 MG tablet; Take 1 tablet (7.5 mg total) by mouth daily as needed for up to 14 days for pain.  Dispense: 30 tablet; Refill: 1 - Ambulatory referral to Orthopedic Surgery  2. Left ankle swelling Not consistent with gout, no bruising or injury Suspect it may be due to arthritis  3. Pedal edema Very mild swelling to her left proximal foot Her physical therapist was concerned that she be seen and evaluated for the swelling and that a blood clot should be ruled out Patient has no calf or pretibial edema no tenderness to behind her knee and her calf circumference is equal no asymmetrical swelling to the lower extremities and no increased risk for DVT so I do not feel that she is at risk for this or needs a vascular study to rule this out I do think that the swelling noted to the foot is secondary to ankle swelling and similarly she has slightly larger right calf than the left and this is likely secondary to her severe right knee osteoarthritis with a good amount of swelling  I also suspect that the introduction of a wedge in her shoe and increased time  on her feet spent over the holidays cooking likely has increased the use of her left side she is leading with this going up the stairs and this is likely triggered some inflammation -follow-up with Ortho, x-ray if any large  delay, follow-up at PCP if any new or progression of swelling or concerns       Zoe BerryLeisa Truth Wolaver, PA-C 05/15/20 8:20 AM

## 2020-05-17 ENCOUNTER — Ambulatory Visit: Payer: Medicare Other

## 2020-07-23 ENCOUNTER — Other Ambulatory Visit: Payer: Self-pay | Admitting: Obstetrics and Gynecology

## 2020-07-24 ENCOUNTER — Telehealth: Payer: Self-pay | Admitting: Obstetrics and Gynecology

## 2020-07-24 NOTE — Telephone Encounter (Signed)
Pt called this morning inquiring about estradiol states recent refill refused- states she has 1 week left of medications. Please advise.

## 2020-07-27 ENCOUNTER — Telehealth: Payer: Self-pay | Admitting: Obstetrics and Gynecology

## 2020-07-27 MED ORDER — ESTRADIOL 0.5 MG PO TABS: 0.5000 mg | ORAL_TABLET | Freq: Every day | ORAL | 3 refills | Status: DC

## 2020-07-27 NOTE — Telephone Encounter (Signed)
New message   Patient called an checking on the status of previous messages - pharmacy had informed that medication was denied by MD -   Asking for a call back today  1. Which medications need to be refilled? (please list name of each medication and dose if known) estradiol (ESTRACE) 0.5 MG tablet  2. Which pharmacy/location (including street and city if local pharmacy) is medication to be sent to?  [facility] [address] [place] - [address] [place] [address]   .

## 2020-07-27 NOTE — Telephone Encounter (Signed)
Pt called no answer LM via VM. apologized to pt concerning her medication being rejected. Informed pt that I did not know anything about it and had completed the refill for her medication and sent it to [facility] on [address] in [place]. Pt was advised to check the status of her medication refill with the pharmacy.

## 2020-09-24 ENCOUNTER — Telehealth: Payer: Self-pay | Admitting: *Deleted

## 2020-09-24 NOTE — Telephone Encounter (Signed)
Pt's husband tested positive for covid yesterday. Pt with cough, worse at HS. HAs not tested as of yet. Pt reports chronic cough "Allergies." IS taking "A children's cough med my nurse daughter recommended." States "Little help." Afebrile, no other symptoms. Advised to test as symptomatic, if negative retest in 24 hours.  Guidelines for self isolation reviewed. Reviewed symptoms that warrant an ED visit. Advised to CB if any questions arise.

## 2020-10-25 ENCOUNTER — Encounter: Payer: Medicare Other | Admitting: Family Medicine

## 2021-03-04 ENCOUNTER — Ambulatory Visit: Payer: Self-pay | Admitting: *Deleted

## 2021-03-04 NOTE — Telephone Encounter (Signed)
Pt called and states she wants recommendations on getting 2nd COVID booster shot. She explained that they have had both shots and had last booster shot in 02/2020. Pt had COVID in 05/22 with mild symptoms. She has concerns of whether to get the 2nd booster or not. She's asking for medical advice for the 2nd COVID booster. I stated that best recommendation is that the provider can give the medical advice for the boosters. Advised that this note will be sent to the provider for review and someone would call with recommendations.  Pt verbalized understanding.   Summary: vaccination concerns   Pt has covid questions, was called earlier , missed call [phone] return cb   ----- Message from Leonia Reeves sent at 03/04/2021  2:24 PM EDT -----  The patient shares that they've previously been diagnosed with COVID in May (mild case)   The patient would like to know if they should receive an additional booster   The patient would like to discuss their health concerns and concerns for the booster when possible      Reason for Disposition  [1] Caller requesting NON-URGENT health information AND [2] PCP's office is the best resource  Answer Assessment - Initial Assessment Questions 1. REASON FOR CALL or QUESTION: "What is your reason for calling today?" or "How can I best help you?" or "What question do you have that I can help answer?"     I have questions regarding Covid booster  Protocols used: Information Only Call - No Triage-A-AH

## 2021-03-04 NOTE — Telephone Encounter (Signed)
Patient called reporting questions regarding having covid in May , mild covid symptoms and if she should receive additional booster. Please advise.   Called patient and husband answered. Patient in shower and husband reports they will call back and ask question together.

## 2021-03-05 NOTE — Telephone Encounter (Signed)
Per Sheliah Mends, pt needs an appointment for medical advice. Thanks!

## 2021-03-13 ENCOUNTER — Ambulatory Visit (INDEPENDENT_AMBULATORY_CARE_PROVIDER_SITE_OTHER): Payer: Medicare Other | Admitting: Family Medicine

## 2021-03-13 ENCOUNTER — Ambulatory Visit: Payer: Medicare Other

## 2021-03-13 ENCOUNTER — Encounter: Payer: Self-pay | Admitting: Family Medicine

## 2021-03-13 ENCOUNTER — Other Ambulatory Visit: Payer: Self-pay

## 2021-03-13 VITALS — BP 132/70 | HR 60 | Temp 98.1°F | Resp 18 | Ht 59.0 in | Wt 143.2 lb

## 2021-03-13 DIAGNOSIS — Z23 Encounter for immunization: Secondary | ICD-10-CM

## 2021-03-13 DIAGNOSIS — Z833 Family history of diabetes mellitus: Secondary | ICD-10-CM | POA: Diagnosis not present

## 2021-03-13 DIAGNOSIS — I10 Essential (primary) hypertension: Secondary | ICD-10-CM

## 2021-03-13 DIAGNOSIS — E785 Hyperlipidemia, unspecified: Secondary | ICD-10-CM | POA: Diagnosis not present

## 2021-03-13 DIAGNOSIS — R002 Palpitations: Secondary | ICD-10-CM

## 2021-03-13 NOTE — Assessment & Plan Note (Signed)
Recent normal labs few weeks ago with [facility]. LDL 99.

## 2021-03-13 NOTE — Assessment & Plan Note (Signed)
Doing well on current regimen, no changes made today. 

## 2021-03-13 NOTE — Assessment & Plan Note (Signed)
Intermittent, symptomatic. Unclear etiology. EKG NSR today. Will obtain labs to assess for contributors. If unrevealing, plan to refer to Cardiology for evaluation and potential heart monitor.

## 2021-03-13 NOTE — Progress Notes (Signed)
   SUBJECTIVE:   CHIEF COMPLAINT / HPI:   Medical advice - wants to know whether she should get COVID booster - has received 3 doses of mRNA vaccine, last 02/2020 - had COVID in May with mild URI symptoms - h/o HTN, HLD, overweight, >73yo  Palpitations - Noticed faster heart rate few years ago.  - only happens occasionally, few times per month. - HR normally in 60-70s. Has been up to 140-150s, lasts for a few hours at a time. - unsure if active at the time but has been emotionally upset some of the time when happening. Some occur at night. - has never had formal w/u for this in the past.  - had nl lipid panel few weeks ago.  - no associated dizziness, nausea, chest pain.  - mom with diabetes. No other known cardiac FH.  OBJECTIVE:   BP 132/70   Pulse 60   Temp 98.1 F (36.7 C) (Oral)   Resp 18   Ht 4\' 11"  (1.499 m)   Wt 143 lb 3.2 oz (65 kg)   SpO2 97%   BMI 28.92 kg/m   Gen: well appearing, in NAD Card: RRR Lungs: CTAB Ext: WWP, no edema  ASSESSMENT/PLAN:   Palpitations Intermittent, symptomatic. Unclear etiology. EKG NSR today. Will obtain labs to assess for contributors. If unrevealing, plan to refer to Cardiology for evaluation and potential heart monitor.  Dyslipidemia Recent normal labs few weeks ago with [facility]. LDL 99.  Hypertension Doing well on current regimen, no changes made today.   COVID vaccination status Discussed risk/benefits of COVID vaccination. Recommended 4th dose with bivalent mRNA vaccine.  , DO

## 2021-03-14 LAB — BASIC METABOLIC PANEL
BUN/Creatinine Ratio: 20 (calc) (ref 6–22)
BUN: 11 mg/dL (ref 7–25)
CO2: 26 mmol/L (ref 20–32)
Calcium: 9.5 mg/dL (ref 8.6–10.4)
Chloride: 106 mmol/L (ref 98–110)
Creat: 0.55 mg/dL — ABNORMAL LOW (ref 0.60–1.00)
Glucose, Bld: 86 mg/dL (ref 65–99)
Potassium: 3.9 mmol/L (ref 3.5–5.3)
Sodium: 140 mmol/L (ref 135–146)

## 2021-03-14 LAB — HEMOGLOBIN A1C
Hgb A1c MFr Bld: 4.6 % of total Hgb (ref <5.7)
Mean Plasma Glucose: 85 mg/dL
eAG (mmol/L): 4.7 mmol/L

## 2021-03-14 LAB — TSH: TSH: 1.54 mIU/L (ref 0.40–4.50)

## 2021-03-14 LAB — MAGNESIUM: Magnesium: 2.3 mg/dL (ref 1.5–2.5)

## 2021-03-14 NOTE — Addendum Note (Signed)
Addended by: Caro Laroche on: 03/14/2021 02:34 PM   Modules accepted: Orders

## 2021-03-15 ENCOUNTER — Other Ambulatory Visit: Payer: Self-pay | Admitting: Family Medicine

## 2021-03-15 DIAGNOSIS — I10 Essential (primary) hypertension: Secondary | ICD-10-CM

## 2021-03-25 ENCOUNTER — Ambulatory Visit: Payer: Medicare Other | Admitting: Cardiology

## 2021-03-25 ENCOUNTER — Other Ambulatory Visit: Payer: Self-pay

## 2021-03-25 ENCOUNTER — Encounter: Payer: Self-pay | Admitting: Cardiology

## 2021-03-25 VITALS — BP 110/80 | HR 107 | Ht <= 58 in | Wt 142.0 lb

## 2021-03-25 DIAGNOSIS — I10 Essential (primary) hypertension: Secondary | ICD-10-CM

## 2021-03-25 DIAGNOSIS — I4891 Unspecified atrial fibrillation: Secondary | ICD-10-CM

## 2021-03-25 MED ORDER — METOPROLOL TARTRATE 25 MG PO TABS
25.0000 mg | ORAL_TABLET | Freq: Two times a day (BID) | ORAL | 3 refills | Status: DC
Start: 2021-03-25 — End: 2021-06-18

## 2021-03-25 MED ORDER — RIVAROXABAN 20 MG PO TABS
20.0000 mg | ORAL_TABLET | Freq: Every day | ORAL | 5 refills | Status: DC
Start: 2021-03-25 — End: 2021-06-13

## 2021-03-25 MED ORDER — METOPROLOL TARTRATE 12.5 MG HALF TABLET
50.0000 mg | ORAL_TABLET | Freq: Once | ORAL | Status: AC
  Administered 2021-03-25: 50 mg via ORAL

## 2021-03-25 NOTE — Patient Instructions (Signed)
Medication Instructions:   Your physician has recommended you make the following change in your medication:    STOP taking your olmesartan (BENICAR).  2.    START taking Metoprolol Tartrate (Lopressor) 25 MG twice a day.  3.    START taking Xarelto 20 MG once a day.  *If you need a refill on your cardiac medications before your next appointment, please call your pharmacy*   Lab Work: None ordered  If you have labs (blood work) drawn today and your tests are completely normal, you will receive your results only by: MyChart Message (if you have MyChart) OR A paper copy in the mail If you have any lab test that is abnormal or we need to change your treatment, we will call you to review the results.   Testing/Procedures:  Your physician has requested that you have an echocardiogram. Echocardiography is a painless test that uses sound waves to create images of your heart. It provides your doctor with information about the size and shape of your heart and how well your heart's chambers and valves are working. This procedure takes approximately one hour. There are no restrictions for this procedure.    Follow-Up: At [facility], you and your health needs are our priority.  As part of our continuing mission to provide you with exceptional heart care, we have created designated Provider Care Teams.  These Care Teams include your primary Cardiologist (physician) and Advanced Practice Providers (APPs -  Physician Assistants and Nurse Practitioners) who all work together to provide you with the care you need, when you need it.  We recommend signing up for the patient portal called "MyChart".  Sign up information is provided on this After Visit Summary.  MyChart is used to connect with patients for Virtual Visits (Telemedicine).  Patients are able to view lab/test results, encounter notes, upcoming appointments, etc.  Non-urgent messages can be sent to your provider as well.   To learn more  about what you can do with MyChart, go to [email or web address].    Your next appointment:   Follow up after Echo   The format for your next appointment:   In Person  Provider:    Dr. Sarita Haver D}    Other Instructions

## 2021-03-25 NOTE — Progress Notes (Signed)
Cardiology Office Note:    Date:  03/25/2021   ID:  Zoe Alvarado, DOB July 31, 1945, MRN [id number]  PCP:  [name], PA-C   [facility] Providers Cardiologist:  Debbe Odea, MD     Referring MD: Caro Laroche, DO   Chief Complaint  Patient presents with   New Patient (Initial Visit)    Referred by PCP for Palpitations. Meds reviewed verbally with patient.     History of Present Illness:    Zoe Alvarado is a 75 y.o. female with a hx of hypertension who presents due to palpitations.  Patient states having symptoms of palpitations ongoing over the past several months.  Seems to have worsened over the past 3 months.  Saw primary care provider who recommended patient see cardiology.  Denies dizziness, chest pain.  States having shortness of breath with exertion, attributes this to deconditioning.  Symptoms of palpitations alcohol about 3 times monthly usually lasting several minutes.  Denies any history of heart disease.  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   Cataract years   Hyperlipidemia    Hypertension    Osteoporosis    Pneumonia    Prolapse of female pelvic organs     Past Surgical History:  Procedure Laterality Date   ABDOMINAL HYSTERECTOMY     CATARACT EXTRACTION W/PHACO Left 11/24/2019   Procedure: CATARACT EXTRACTION PHACO AND INTRAOCULAR LENS PLACEMENT (IOC) LEFT Zoe Alvarado;  Surgeon: Lockie Mola, MD;  Location: [facility] ORS;  Service: Ophthalmology;  Laterality: Left;  cde 16.31 [place] 1:39 ap 16.5    CATARACT EXTRACTION W/PHACO Right 12/14/2019   Procedure: CATARACT EXTRACTION PHACO AND INTRAOCULAR LENS PLACEMENT (IOC) RIGHT EYHANCE TORIC 3.77 00:41.5 901%;  Surgeon: Lockie Mola, MD;  Location: [facility];  Service: Ophthalmology;  Laterality: Right;   CHOLECYSTECTOMY     DENTAL SURGERY     dental implants   DILATION AND  CURETTAGE OF UTERUS     KNEE SURGERY     arthroscopy   TONSILLECTOMY      Current Medications: Current Meds  Medication Sig   alendronate (FOSAMAX) 70 MG tablet Take 70 mg by mouth once a week. Take with a full glass of water on an empty stomach.   Calcium Carbonate-Vit D-Min (CALTRATE 600+D PLUS MINERALS) 600-800 MG-UNIT CHEW Chew 2 tablets by mouth daily. chocolate chews   estradiol (ESTRACE) 0.5 MG tablet Take 1 tablet (0.5 mg total) by mouth daily.   metoprolol tartrate (LOPRESSOR) 25 MG tablet Take 1 tablet (25 mg total) by mouth 2 (two) times daily.   MULTIPLE VITAMINS-MINERALS PO Take 1 tablet by mouth daily.   rivaroxaban (XARELTO) 20 MG TABS tablet Take 1 tablet (20 mg total) by mouth daily with supper.   sodium chloride (OCEAN) 0.65 % SOLN nasal spray Place 1 spray into both nostrils daily as needed for congestion.   [DISCONTINUED] olmesartan (BENICAR) 5 MG tablet Take 1 tablet by mouth once daily     Allergies:   Codeine and Penicillins   Social History   Socioeconomic History   Marital status: Married    Spouse name: Not on file   Number of children: 3   Years of education: Not on file   Highest education level: Not on file  Occupational History   Not on file  Tobacco Use   Smoking status: Never   Smokeless tobacco: Never  Vaping Use   Vaping Use: Never used  Substance and Sexual Activity   Alcohol use: No    Alcohol/week: 0.0 standard drinks   Drug use: No   Sexual activity: Yes    Partners: Male    Birth control/protection: Surgical  Other Topics Concern   Not on file  Social History Narrative   2nd marriage   3 living sons, 2 deceased   Social Determinants of Health   Financial Resource Strain: Not on file  Food Insecurity: Not on file  Transportation Needs: Not on file  Physical Activity: Not on file  Stress: Not on file  Social Connections: Not on file     Family History: The patient's family history includes Breast cancer in her maternal  grandmother; Breast cancer (age of onset: 70) in her maternal aunt; Cancer in her father, maternal aunt, and maternal grandmother; Diabetes in her brother and mother; Heart attack in her father; Heart disease in her father; Hypertension in her father; Prostate cancer in her father; Vision loss in her father. There is no history of Ovarian cancer or Colon cancer.  ROS:   Please see the history of present illness.     All other systems reviewed and are negative.  EKGs/Labs/Other Studies Reviewed:    The following studies were reviewed today:   EKG:  EKG is  ordered today.  The ekg ordered today demonstrates atrial fibrillation, heart 107.  Recent Labs: 03/13/2021: BUN 11; Creat 0.55; Magnesium 2.3; Potassium 3.9; Sodium 140; TSH 1.54  Recent Lipid Panel    Component Value Date/Time   CHOL 165 05/02/2019 0000   CHOL 176 08/03/2015 0827   TRIG 99 05/02/2019 0000   HDL 61 05/02/2019 0000   HDL 63 08/03/2015 0827   CHOLHDL 2.7 05/02/2019 0000   LDLCALC 85 05/02/2019 0000     Risk Assessment/Calculations:          Physical Exam:    VS:  BP 110/80 (BP Location: Left Arm, Patient Position: Sitting, Cuff Size: Normal)   Pulse (!) 107   Ht 4\' 10"  (1.473 m)   Wt 142 lb (64.4 kg)   SpO2 98%   BMI 29.68 kg/m     Wt Readings from Last 3 Encounters:  03/25/21 142 lb (64.4 kg)  03/13/21 143 lb 3.2 oz (65 kg)  05/15/20 146 lb 8 oz (66.5 kg)     GEN:  Well nourished, well developed in no acute distress HEENT: Normal NECK: No JVD; No carotid bruits LYMPHATICS: No lymphadenopathy CARDIAC: Irregular irregular, tachycardic RESPIRATORY:  Clear to auscultation without rales, wheezing or rhonchi  ABDOMEN: Soft, non-tender, non-distended MUSCULOSKELETAL:  No edema; No deformity  SKIN: Warm and dry NEUROLOGIC:  Alert and oriented x 3 PSYCHIATRIC:  Normal affect   ASSESSMENT:    1. Atrial fibrillation, unspecified type ([place])   2. Primary hypertension    PLAN:    In order of  problems listed above:  Atrial fibrillation, CHA2DS2-VASc of  3(age, gender, htn).  EKG shows A. fib, heart rate 107.  Lopressor 50 mg x 1 given.  Start Lopressor 25 mg twice daily.  Start Xarelto 20 mg daily.  Get echocardiogram.  Based on echocardiogram and patient's symptoms, will consider DC cardioversion. Hypertension, BP controlled.  Start Lopressor as above.  Stop Benicar.  Follow-up after echocardiogram.        Medication Adjustments/Labs and Tests Ordered: Current medicines are reviewed at length with the patient today.  Concerns regarding medicines are outlined above.  Orders  Placed This Encounter  Procedures   EKG 12-Lead   ECHOCARDIOGRAM COMPLETE   Meds ordered this encounter  Medications   metoprolol tartrate (LOPRESSOR) tablet 50 mg   metoprolol tartrate (LOPRESSOR) 25 MG tablet    Sig: Take 1 tablet (25 mg total) by mouth 2 (two) times daily.    Dispense:  60 tablet    Refill:  3   rivaroxaban (XARELTO) 20 MG TABS tablet    Sig: Take 1 tablet (20 mg total) by mouth daily with supper.    Dispense:  30 tablet    Refill:  5    Patient Instructions  Medication Instructions:   Your physician has recommended you make the following change in your medication:    STOP taking your olmesartan (BENICAR).  2.    START taking Metoprolol Tartrate (Lopressor) 25 MG twice a day.  3.    START taking Xarelto 20 MG once a day.  *If you need a refill on your cardiac medications before your next appointment, please call your pharmacy*   Lab Work: None ordered  If you have labs (blood work) drawn today and your tests are completely normal, you will receive your results only by: [place] (if you have MyChart) OR A paper copy in the mail If you have any lab test that is abnormal or we need to change your treatment, we will call you to review the results.   Testing/Procedures:  Your physician has requested that you have an echocardiogram. Echocardiography is a  painless test that uses sound waves to create images of your heart. It provides your doctor with information about the size and shape of your heart and how well your heart's chambers and valves are working. This procedure takes approximately one hour. There are no restrictions for this procedure.    Follow-Up: At [facility], you and your health needs are our priority.  As part of our continuing mission to provide you with exceptional heart care, we have created designated Provider Care Teams.  These Care Teams include your primary Cardiologist (physician) and Advanced Practice Providers (APPs -  Physician Assistants and Nurse Practitioners) who all work together to provide you with the care you need, when you need it.  We recommend signing up for the patient portal called "MyChart".  Sign up information is provided on this After Visit Summary.  MyChart is used to connect with patients for Virtual Visits (Telemedicine).  Patients are able to view lab/test results, encounter notes, upcoming appointments, etc.  Non-urgent messages can be sent to your provider as well.   To learn more about what you can do with MyChart, go to [email or web address].    Your next appointment:   Follow up after Echo   The format for your next appointment:   In Person  Provider:    Dr. Rosiland Oz D}    Other Instructions    Signed, Kate Sable, MD  03/25/2021 5:21 PM    [place]

## 2021-03-26 ENCOUNTER — Ambulatory Visit (INDEPENDENT_AMBULATORY_CARE_PROVIDER_SITE_OTHER): Payer: Medicare Other

## 2021-03-26 DIAGNOSIS — I4891 Unspecified atrial fibrillation: Secondary | ICD-10-CM | POA: Diagnosis not present

## 2021-03-26 LAB — ECHOCARDIOGRAM COMPLETE
AR max vel: 1.9 cm2
AV Area VTI: 2.08 cm2
AV Area mean vel: 1.98 cm2
AV Mean grad: 2 mmHg
AV Peak grad: 4.9 mmHg
Ao pk vel: 1.11 m/s
Calc EF: 51.6 %
S' Lateral: 2.6 cm
Single Plane A2C EF: 46.5 %
Single Plane A4C EF: 59.1 %

## 2021-03-29 ENCOUNTER — Telehealth: Payer: Self-pay | Admitting: Cardiology

## 2021-03-29 NOTE — Telephone Encounter (Signed)
Patient calling States she is having flu symptoms and wants to know if she can take an over the counter flu medicine  It is Vic's Nyquil liquid medicine for high blood pressure, cold and flu It is sugar and alcohol free Please call to discuss

## 2021-03-29 NOTE — Telephone Encounter (Signed)
Assured patient that this would be fine.  She also had some daytime cold/flu medication that contains decongestant.  Recommended she avoid that.

## 2021-04-04 ENCOUNTER — Ambulatory Visit: Payer: Medicare Other | Admitting: Cardiology

## 2021-04-04 ENCOUNTER — Other Ambulatory Visit: Payer: Self-pay

## 2021-04-04 ENCOUNTER — Encounter: Payer: Self-pay | Admitting: Cardiology

## 2021-04-04 VITALS — BP 122/80 | HR 88 | Ht 59.0 in | Wt 142.0 lb

## 2021-04-04 DIAGNOSIS — I4891 Unspecified atrial fibrillation: Secondary | ICD-10-CM

## 2021-04-04 DIAGNOSIS — I10 Essential (primary) hypertension: Secondary | ICD-10-CM

## 2021-04-04 NOTE — Patient Instructions (Signed)
Medication Instructions:  Your physician recommends that you continue on your current medications as directed. Please refer to the Current Medication list given to you today.  *If you need a refill on your cardiac medications before your next appointment, please call your pharmacy*   Lab Work:  CBC, BMP to be drawn today  If you have labs (blood work) drawn today and your tests are completely normal, you will receive your results only by: MyChart Message (if you have MyChart) OR A paper copy in the mail If you have any lab test that is abnormal or we need to change your treatment, we will call you to review the results.   Testing/Procedures:  You are scheduled for a Cardioversion on ___Wednesday Dec. 7th___ with Dr.__Agbor-Etang___ [facility] at [facility] at _________ a.m. on the day of your procedure.  DIET INSTRUCTIONS:  Nothing to eat or drink after midnight except your medications with a small sip of water.         Labs: ___Drawn in office on 11/17/22_______________  Medications:  YOU MAY TAKE ALL of your remaining medications with a small amount of water.  Must have a responsible person to drive you home.  Bring a current list of your medications and current insurance cards.    If you have any questions after you get home, please call the office at [phone]    Follow-Up: At [facility], you and your health needs are our priority.  As part of our continuing mission to provide you with exceptional heart care, we have created designated Provider Care Teams.  These Care Teams include your primary Cardiologist (physician) and Advanced Practice Providers (APPs -  Physician Assistants and Nurse Practitioners) who all work together to provide you with the care you need, when you need it.  We recommend signing up for the patient portal called "MyChart".  Sign up information is provided on this After Visit Summary.  MyChart is used to connect with patients  for Virtual Visits (Telemedicine).  Patients are able to view lab/test results, encounter notes, upcoming appointments, etc.  Non-urgent messages can be sent to your provider as well.   To learn more about what you can do with MyChart, go to [email or web address].    Your next appointment:   2 Months  The format for your next appointment:   In Person  Provider:   You may see Debbe Odea, MD or one of the following Advanced Practice Providers on your designated Care Team:   Nicolasa Ducking, NP Eula Listen, PA-C Cadence Fransico Michael, [place]    Other Instructions

## 2021-04-04 NOTE — H&P (View-Only) (Signed)
Cardiology Office Note:    Date:  04/04/2021   ID:  Zoe Alvarado, DOB 10-Dec-1945, MRN [id number]  PCP:  [name], PA-C   [facility] Providers Cardiologist:  Debbe Odea, MD     Referring MD: [name], PA-C   Chief Complaint  Patient presents with   Other    Follow up post ECHO -- Meds reviewed verbally with patient.     History of Present Illness:    Zoe Alvarado is a 75 y.o. female with a hx of persistent atrial fibrillation, hypertension who presents for follow-up.    Patient previously seen due to palpitations, EKG showed atrial fibrillation.  Started on Lopressor 25 mg twice daily, Xarelto 20 mg daily.  Symptoms of palpitations are much improved.  Denies any chest pain, shortness of breath, dizziness.  Feels well, has no concerns at this time.  Takes Xarelto as prescribed, denies any bleeding issues.  Prior notes Echocardiogram 03/2021 did show preserved ejection fraction, EF 50 to 55%.  Mildly dilated LA.   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   Cataract years   Hyperlipidemia    Hypertension    Osteoporosis    Pneumonia    Prolapse of female pelvic organs     Past Surgical History:  Procedure Laterality Date   ABDOMINAL HYSTERECTOMY     CATARACT EXTRACTION W/PHACO Left 11/24/2019   Procedure: CATARACT EXTRACTION PHACO AND INTRAOCULAR LENS PLACEMENT (IOC) LEFT [name];  Surgeon: Lockie Mola, MD;  Location: [facility];  Service: Ophthalmology;  Laterality: Left;  cde 16.31 [place] 1:39 ap 16.5    CATARACT EXTRACTION W/PHACO Right 12/14/2019   Procedure: CATARACT EXTRACTION PHACO AND INTRAOCULAR LENS PLACEMENT (IOC) RIGHT EYHANCE TORIC 3.77 00:41.5 901%;  Surgeon: Lockie Mola, MD;  Location: [facility];  Service: Ophthalmology;  Laterality: Right;   CHOLECYSTECTOMY     DENTAL SURGERY     dental implants   DILATION  AND CURETTAGE OF UTERUS     KNEE SURGERY     arthroscopy   TONSILLECTOMY      Current Medications: Current Meds  Medication Sig   alendronate (FOSAMAX) 70 MG tablet Take 70 mg by mouth once a week. Take with a full glass of water on an empty stomach.   Calcium Carbonate-Vit D-Min (CALTRATE 600+D PLUS MINERALS) 600-800 MG-UNIT CHEW Chew 2 tablets by mouth daily. chocolate chews   estradiol (ESTRACE) 0.5 MG tablet Take 1 tablet (0.5 mg total) by mouth daily.   metoprolol tartrate (LOPRESSOR) 25 MG tablet Take 1 tablet (25 mg total) by mouth 2 (two) times daily.   MULTIPLE VITAMINS-MINERALS PO Take 1 tablet by mouth daily.   rivaroxaban (XARELTO) 20 MG TABS tablet Take 1 tablet (20 mg total) by mouth daily with supper.   sodium chloride (OCEAN) 0.65 % SOLN nasal spray Place 1 spray into both nostrils daily as needed for congestion.     Allergies:   Codeine and Penicillins   Social History   Socioeconomic History   Marital status: Married    Spouse name: Not on file   Number of children: 3   Years of education: Not on file   Highest education level: Not on file  Occupational History   Not on file  Tobacco Use   Smoking status: Never   Smokeless tobacco: Never  Vaping Use   Vaping Use: Never used  Substance and Sexual Activity   Alcohol use: No    Alcohol/week: 0.0 standard drinks   Drug use: No   Sexual activity: Yes    Partners: Male    Birth control/protection: Surgical  Other Topics Concern   Not on file  Social History Narrative   2nd marriage   3 living sons, 2 deceased   Social Determinants of Health   Financial Resource Strain: Not on file  Food Insecurity: Not on file  Transportation Needs: Not on file  Physical Activity: Not on file  Stress: Not on file  Social Connections: Not on file     Family History: The patient's family history includes Breast cancer in her maternal grandmother; Breast cancer (age of onset: 40) in her maternal aunt; Cancer in her  father, maternal aunt, and maternal grandmother; Diabetes in her brother and mother; Heart attack in her father; Heart disease in her father; Hypertension in her father; Prostate cancer in her father; Vision loss in her father. There is no history of Ovarian cancer or Colon cancer.  ROS:   Please see the history of present illness.     All other systems reviewed and are negative.  EKGs/Labs/Other Studies Reviewed:    The following studies were reviewed today:   EKG:  EKG not ordered today.    Recent Labs: 03/13/2021: BUN 11; Creat 0.55; Magnesium 2.3; Potassium 3.9; Sodium 140; TSH 1.54  Recent Lipid Panel    Component Value Date/Time   CHOL 165 05/02/2019 0000   CHOL 176 08/03/2015 0827   TRIG 99 05/02/2019 0000   HDL 61 05/02/2019 0000   HDL 63 08/03/2015 0827   CHOLHDL 2.7 05/02/2019 0000   LDLCALC 85 05/02/2019 0000     Risk Assessment/Calculations:          Physical Exam:    VS:  BP 122/80 (BP Location: Left Arm, Patient Position: Sitting, Cuff Size: Normal)   Pulse 88   Ht 4' 11" (1.499 m)   Wt 142 lb (64.4 kg)   SpO2 97%   BMI 28.68 kg/m     Wt Readings from Last 3 Encounters:  04/04/21 142 lb (64.4 kg)  03/25/21 142 lb (64.4 kg)  03/13/21 143 lb 3.2 oz (65 kg)     GEN:  Well nourished, well developed in no acute distress HEENT: Normal NECK: No JVD; No carotid bruits LYMPHATICS: No lymphadenopathy CARDIAC: Irregular irregular RESPIRATORY:  Clear to auscultation without rales, wheezing or rhonchi  ABDOMEN: Soft, non-tender, non-distended MUSCULOSKELETAL:  No edema; No deformity  SKIN: Warm and dry NEUROLOGIC:  Alert and oriented x 3 PSYCHIATRIC:  Normal affect   ASSESSMENT:    1. Atrial fibrillation, unspecified type (H[facility])   2. Primary hypertension     PLAN:    In order of problems listed above:  Persistent atrial fibrillation, CHA2DS2-VASc of  3(age, gender, htn).  Echo shows normal EF 50 to 55%, mild LA dilatation.  Patient clinically  asymptomatic.  Continue Lopressor 25 mg twice daily, continue Xarelto 20 mg daily.  Plan DC cardioversion in 3 weeks.  Refer patient to EP/A. fib clinic. Hypertension, BP controlled.  Continue Lopressor 25 twice daily.  Follow-up after DC cardioversion.     Shared Decision Making/Informed Consent The risks (stroke, cardiac arrhythmias rarely resulting in the need for a temporary or permanent pacemaker, skin irritation or burns and complications associated with conscious sedation including aspiration, arrhythmia, respiratory failure and death), benefits (restoration of normal sinus rhythm) and alternatives of a direct current   cardioversion were explained in detail to Ms. Meakin and she agrees to proceed.     Medication Adjustments/Labs and Tests Ordered: Current medicines are reviewed at length with the patient today.  Concerns regarding medicines are outlined above.  Orders Placed This Encounter  Procedures   CBC   Basic metabolic panel   Ambulatory referral to Cardiac Electrophysiology    No orders of the defined types were placed in this encounter.   Patient Instructions  Medication Instructions:  Your physician recommends that you continue on your current medications as directed. Please refer to the Current Medication list given to you today.  *If you need a refill on your cardiac medications before your next appointment, please call your pharmacy*   Lab Work:  CBC, BMP to be drawn today  If you have labs (blood work) drawn today and your tests are completely normal, you will receive your results only by: [place] (if you have MyChart) OR A paper copy in the mail If you have any lab test that is abnormal or we need to change your treatment, we will call you to review the results.   Testing/Procedures:  You are scheduled for a Cardioversion on ___Wednesday Dec. 7th___ with Dr.__Agbor-Etang___ Please arrive at the [place] [facility] at _________ a.m. on the day of  your procedure.  DIET INSTRUCTIONS:  Nothing to eat or drink after midnight except your medications with a small sip of water.         Labs: ___Drawn in office on 11/17/22_______________  Medications:  YOU MAY TAKE ALL of your remaining medications with a small amount of water.  Must have a responsible person to drive you home.  Bring a current list of your medications and current insurance cards.    If you have any questions after you get home, please call the office at [phone]    Follow-Up: At [facility], you and your health needs are our priority.  As part of our continuing mission to provide you with exceptional heart care, we have created designated Provider Care Teams.  These Care Teams include your primary Cardiologist (physician) and Advanced Practice Providers (APPs -  Physician Assistants and Nurse Practitioners) who all work together to provide you with the care you need, when you need it.  We recommend signing up for the patient portal called "MyChart".  Sign up information is provided on this After Visit Summary.  MyChart is used to connect with patients for Virtual Visits (Telemedicine).  Patients are able to view lab/test results, encounter notes, upcoming appointments, etc.  Non-urgent messages can be sent to your provider as well.   To learn more about what you can do with MyChart, go to [email or web address].    Your next appointment:   2 Months  The format for your next appointment:   In Person  Provider:   You may see Kate Sable, MD or one of the following Advanced Practice Providers on your designated Care Team:   Murray Hodgkins, NP Christell Faith, PA-C Cadence Kathlen Mody, [place]    Other Instructions    Signed, Kate Sable, MD  04/04/2021 11:38 AM    [place]

## 2021-04-04 NOTE — Progress Notes (Signed)
Cardiology Office Note:    Date:  04/04/2021   ID:  Zoe Alvarado, DOB 10-Dec-1945, MRN [id number]  PCP:  [name], PA-C   [facility] Providers Cardiologist:  Debbe Odea, MD     Referring MD: [name], PA-C   Chief Complaint  Patient presents with   Other    Follow up post ECHO -- Meds reviewed verbally with patient.     History of Present Illness:    Zoe Alvarado is a 75 y.o. female with a hx of persistent atrial fibrillation, hypertension who presents for follow-up.    Patient previously seen due to palpitations, EKG showed atrial fibrillation.  Started on Lopressor 25 mg twice daily, Xarelto 20 mg daily.  Symptoms of palpitations are much improved.  Denies any chest pain, shortness of breath, dizziness.  Feels well, has no concerns at this time.  Takes Xarelto as prescribed, denies any bleeding issues.  Prior notes Echocardiogram 03/2021 did show preserved ejection fraction, EF 50 to 55%.  Mildly dilated LA.   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   Cataract years   Hyperlipidemia    Hypertension    Osteoporosis    Pneumonia    Prolapse of female pelvic organs     Past Surgical History:  Procedure Laterality Date   ABDOMINAL HYSTERECTOMY     CATARACT EXTRACTION W/PHACO Left 11/24/2019   Procedure: CATARACT EXTRACTION PHACO AND INTRAOCULAR LENS PLACEMENT (IOC) LEFT [name];  Surgeon: Lockie Mola, MD;  Location: [facility];  Service: Ophthalmology;  Laterality: Left;  cde 16.31 [place] 1:39 ap 16.5    CATARACT EXTRACTION W/PHACO Right 12/14/2019   Procedure: CATARACT EXTRACTION PHACO AND INTRAOCULAR LENS PLACEMENT (IOC) RIGHT EYHANCE TORIC 3.77 00:41.5 901%;  Surgeon: Lockie Mola, MD;  Location: [facility];  Service: Ophthalmology;  Laterality: Right;   CHOLECYSTECTOMY     DENTAL SURGERY     dental implants   DILATION  AND CURETTAGE OF UTERUS     KNEE SURGERY     arthroscopy   TONSILLECTOMY      Current Medications: Current Meds  Medication Sig   alendronate (FOSAMAX) 70 MG tablet Take 70 mg by mouth once a week. Take with a full glass of water on an empty stomach.   Calcium Carbonate-Vit D-Min (CALTRATE 600+D PLUS MINERALS) 600-800 MG-UNIT CHEW Chew 2 tablets by mouth daily. chocolate chews   estradiol (ESTRACE) 0.5 MG tablet Take 1 tablet (0.5 mg total) by mouth daily.   metoprolol tartrate (LOPRESSOR) 25 MG tablet Take 1 tablet (25 mg total) by mouth 2 (two) times daily.   MULTIPLE VITAMINS-MINERALS PO Take 1 tablet by mouth daily.   rivaroxaban (XARELTO) 20 MG TABS tablet Take 1 tablet (20 mg total) by mouth daily with supper.   sodium chloride (OCEAN) 0.65 % SOLN nasal spray Place 1 spray into both nostrils daily as needed for congestion.     Allergies:   Codeine and Penicillins   Social History   Socioeconomic History   Marital status: Married    Spouse name: Not on file   Number of children: 3   Years of education: Not on file   Highest education level: Not on file  Occupational History   Not on file  Tobacco Use   Smoking status: Never   Smokeless tobacco: Never  Vaping Use   Vaping Use: Never used  Substance and Sexual Activity   Alcohol use: No    Alcohol/week: 0.0 standard drinks   Drug use: No   Sexual activity: Yes    Partners: Male    Birth control/protection: Surgical  Other Topics Concern   Not on file  Social History Narrative   2nd marriage   3 living sons, 2 deceased   Social Determinants of Health   Financial Resource Strain: Not on file  Food Insecurity: Not on file  Transportation Needs: Not on file  Physical Activity: Not on file  Stress: Not on file  Social Connections: Not on file     Family History: The patient's family history includes Breast cancer in her maternal grandmother; Breast cancer (age of onset: 50) in her maternal aunt; Cancer in her  father, maternal aunt, and maternal grandmother; Diabetes in her brother and mother; Heart attack in her father; Heart disease in her father; Hypertension in her father; Prostate cancer in her father; Vision loss in her father. There is no history of Ovarian cancer or Colon cancer.  ROS:   Please see the history of present illness.     All other systems reviewed and are negative.  EKGs/Labs/Other Studies Reviewed:    The following studies were reviewed today:   EKG:  EKG not ordered today.    Recent Labs: 03/13/2021: BUN 11; Creat 0.55; Magnesium 2.3; Potassium 3.9; Sodium 140; TSH 1.54  Recent Lipid Panel    Component Value Date/Time   CHOL 165 05/02/2019 0000   CHOL 176 08/03/2015 0827   TRIG 99 05/02/2019 0000   HDL 61 05/02/2019 0000   HDL 63 08/03/2015 0827   CHOLHDL 2.7 05/02/2019 0000   LDLCALC 85 05/02/2019 0000     Risk Assessment/Calculations:          Physical Exam:    VS:  BP 122/80 (BP Location: Left Arm, Patient Position: Sitting, Cuff Size: Normal)   Pulse 88   Ht 4\' 11"  (1.499 m)   Wt 142 lb (64.4 kg)   SpO2 97%   BMI 28.68 kg/m     Wt Readings from Last 3 Encounters:  04/04/21 142 lb (64.4 kg)  03/25/21 142 lb (64.4 kg)  03/13/21 143 lb 3.2 oz (65 kg)     GEN:  Well nourished, well developed in no acute distress HEENT: Normal NECK: No JVD; No carotid bruits LYMPHATICS: No lymphadenopathy CARDIAC: Irregular irregular RESPIRATORY:  Clear to auscultation without rales, wheezing or rhonchi  ABDOMEN: Soft, non-tender, non-distended MUSCULOSKELETAL:  No edema; No deformity  SKIN: Warm and dry NEUROLOGIC:  Alert and oriented x 3 PSYCHIATRIC:  Normal affect   ASSESSMENT:    1. Atrial fibrillation, unspecified type ([place])   2. Primary hypertension     PLAN:    In order of problems listed above:  Persistent atrial fibrillation, CHA2DS2-VASc of  3(age, gender, htn).  Echo shows normal EF 50 to 55%, mild LA dilatation.  Patient clinically  asymptomatic.  Continue Lopressor 25 mg twice daily, continue Xarelto 20 mg daily.  Plan DC cardioversion in 3 weeks.  Refer patient to EP/A. fib clinic. Hypertension, BP controlled.  Continue Lopressor 25 twice daily.  Follow-up after DC cardioversion.     Shared Decision Making/Informed Consent The risks (stroke, cardiac arrhythmias rarely resulting in the need for a temporary or permanent pacemaker, skin irritation or burns and complications associated with conscious sedation including aspiration, arrhythmia, respiratory failure and death), benefits (restoration of normal sinus rhythm) and alternatives of a direct current  cardioversion were explained in detail to Ms. Marck and she agrees to proceed.     Medication Adjustments/Labs and Tests Ordered: Current medicines are reviewed at length with the patient today.  Concerns regarding medicines are outlined above.  Orders Placed This Encounter  Procedures   CBC   Basic metabolic panel   Ambulatory referral to Cardiac Electrophysiology    No orders of the defined types were placed in this encounter.   Patient Instructions  Medication Instructions:  Your physician recommends that you continue on your current medications as directed. Please refer to the Current Medication list given to you today.  *If you need a refill on your cardiac medications before your next appointment, please call your pharmacy*   Lab Work:  CBC, BMP to be drawn today  If you have labs (blood work) drawn today and your tests are completely normal, you will receive your results only by: [place] (if you have MyChart) OR A paper copy in the mail If you have any lab test that is abnormal or we need to change your treatment, we will call you to review the results.   Testing/Procedures:  You are scheduled for a Cardioversion on ___Wednesday Dec. 7th___ with Dr.__Agbor-Etang___ Please arrive at the [place] [facility] at _________ a.m. on the day of  your procedure.  DIET INSTRUCTIONS:  Nothing to eat or drink after midnight except your medications with a small sip of water.         Labs: ___Drawn in office on 11/17/22_______________  Medications:  YOU MAY TAKE ALL of your remaining medications with a small amount of water.  Must have a responsible person to drive you home.  Bring a current list of your medications and current insurance cards.    If you have any questions after you get home, please call the office at [phone]    Follow-Up: At [facility], you and your health needs are our priority.  As part of our continuing mission to provide you with exceptional heart care, we have created designated Provider Care Teams.  These Care Teams include your primary Cardiologist (physician) and Advanced Practice Providers (APPs -  Physician Assistants and Nurse Practitioners) who all work together to provide you with the care you need, when you need it.  We recommend signing up for the patient portal called "MyChart".  Sign up information is provided on this After Visit Summary.  MyChart is used to connect with patients for Virtual Visits (Telemedicine).  Patients are able to view lab/test results, encounter notes, upcoming appointments, etc.  Non-urgent messages can be sent to your provider as well.   To learn more about what you can do with MyChart, go to [email or web address].    Your next appointment:   2 Months  The format for your next appointment:   In Person  Provider:   You may see Kate Sable, MD or one of the following Advanced Practice Providers on your designated Care Team:   Murray Hodgkins, NP Christell Faith, PA-C Cadence Kathlen Mody, [place]    Other Instructions    Signed, Kate Sable, MD  04/04/2021 11:38 AM    [place]

## 2021-04-05 LAB — BASIC METABOLIC PANEL
BUN/Creatinine Ratio: 12 (ref 12–28)
BUN: 7 mg/dL — ABNORMAL LOW (ref 8–27)
CO2: 20 mmol/L (ref 20–29)
Calcium: 9.6 mg/dL (ref 8.7–10.3)
Chloride: 104 mmol/L (ref 96–106)
Creatinine, Ser: 0.58 mg/dL (ref 0.57–1.00)
Glucose: 86 mg/dL (ref 70–99)
Potassium: 4.1 mmol/L (ref 3.5–5.2)
Sodium: 141 mmol/L (ref 134–144)
eGFR: 95 mL/min/{1.73_m2} (ref >59)

## 2021-04-05 LAB — CBC
Hematocrit: 40.8 % (ref 34.0–46.6)
Hemoglobin: 13.8 g/dL (ref 11.1–15.9)
MCH: 29.6 pg (ref 26.6–33.0)
MCHC: 33.8 g/dL (ref 31.5–35.7)
MCV: 87 fL (ref 79–97)
Platelets: 233 10*3/uL (ref 150–450)
RBC: 4.67 x10E6/uL (ref 3.77–5.28)
RDW: 12.2 % (ref 11.7–15.4)
WBC: 7.9 10*3/uL (ref 3.4–10.8)

## 2021-04-10 ENCOUNTER — Telehealth: Payer: Self-pay | Admitting: Cardiology

## 2021-04-10 NOTE — Telephone Encounter (Signed)
Returned the patients call.  Adv the patient before performing a cardioversion, you are placed on a blood thinner like the prescribed Xarelto to ensure that your risk of blood clot formation and thus of stroke or heart attack is low. This can be done by ensuring that your blood has been adequately thinned for 3 to 4 weeks before the cardioversion.  At the time of her dccv she will have been on Xarelto for 4 weeks.  Patient verbalized understanding and voiced appreciation for the call back.

## 2021-04-10 NOTE — Telephone Encounter (Signed)
Patient scheduled for cardioversion 12/7 and wants to know POC on checking for blood clot a voiced concern at ov.    Please call to discuss.   Per patient mychart response ok .

## 2021-04-22 NOTE — Telephone Encounter (Signed)
Patient calling Patient noticed that her metoprolol medication says to take with food Would like to know if it is necessary to take this medication before procedure with just a sip of water or wait til after procedure Please call to discuss

## 2021-04-22 NOTE — Telephone Encounter (Signed)
Called patient and informed her that it would be okay to take her Metoprolol without food. Patient was grateful for the call back.

## 2021-04-24 ENCOUNTER — Ambulatory Visit: Payer: Medicare Other | Admitting: Certified Registered Nurse Anesthetist

## 2021-04-24 ENCOUNTER — Ambulatory Visit
Admission: RE | Admit: 2021-04-24 | Discharge: 2021-04-24 | Disposition: A | Discharge status: Home / Self Care | Payer: Medicare Other | Attending: Cardiology | Admitting: Cardiology

## 2021-04-24 ENCOUNTER — Encounter: Payer: Self-pay | Admitting: Cardiology

## 2021-04-24 ENCOUNTER — Encounter
Admission: RE | Disposition: A | Discharge status: Home / Self Care | Payer: Self-pay | Source: Home / Self Care | Attending: Cardiology

## 2021-04-24 DIAGNOSIS — I4891 Unspecified atrial fibrillation: Secondary | ICD-10-CM | POA: Diagnosis present

## 2021-04-24 DIAGNOSIS — I4819 Other persistent atrial fibrillation: Secondary | ICD-10-CM

## 2021-04-24 DIAGNOSIS — Z955 Presence of coronary angioplasty implant and graft: Secondary | ICD-10-CM | POA: Insufficient documentation

## 2021-04-24 DIAGNOSIS — E785 Hyperlipidemia, unspecified: Secondary | ICD-10-CM | POA: Diagnosis not present

## 2021-04-24 DIAGNOSIS — I10 Essential (primary) hypertension: Secondary | ICD-10-CM | POA: Diagnosis not present

## 2021-04-24 HISTORY — PX: CARDIOVERSION: SHX1299

## 2021-04-24 SURGERY — CARDIOVERSION
Anesthesia: General

## 2021-04-24 MED ORDER — PROPOFOL 10 MG/ML IV BOLUS
INTRAVENOUS | Status: DC | PRN
  Administered 2021-04-24: 40 mg via INTRAVENOUS
  Administered 2021-04-24: 20 mg via INTRAVENOUS

## 2021-04-24 MED ORDER — PHENYLEPHRINE HCL-NACL 20-0.9 MG/250ML-% IV SOLN
INTRAVENOUS | Status: AC
  Filled 2021-04-24: qty 250

## 2021-04-24 MED ORDER — SODIUM CHLORIDE 0.9 % IV SOLN: INTRAVENOUS | Status: DC

## 2021-04-24 MED ORDER — PROPOFOL 500 MG/50ML IV EMUL
INTRAVENOUS | Status: AC
  Filled 2021-04-24: qty 50

## 2021-04-24 NOTE — Anesthesia Postprocedure Evaluation (Signed)
Anesthesia Post Note  Patient: Zoe Alvarado  Procedure(s) Performed: CARDIOVERSION  Patient location during evaluation: Specials Recovery Anesthesia Type: General Level of consciousness: awake and alert Pain management: pain level controlled Vital Signs Assessment: post-procedure vital signs reviewed and stable Respiratory status: spontaneous breathing, nonlabored ventilation, respiratory function stable and patient connected to nasal cannula oxygen Cardiovascular status: blood pressure returned to baseline and stable Postop Assessment: no apparent nausea or vomiting Anesthetic complications: no   No notable events documented.   Last Vitals:  Vitals:   04/24/21 0800 04/24/21 0815  BP: 102/67 104/69  Pulse: (!) 58 62  Resp: 20 14  Temp:    SpO2: 97% 97%    Last Pain:  Vitals:   04/24/21 0815  TempSrc:   PainSc: 0-No pain                 Lenard Simmer

## 2021-04-24 NOTE — Transfer of Care (Signed)
Immediate Anesthesia Transfer of Care Note  Patient: Zoe Alvarado  Procedure(s) Performed: CARDIOVERSION  Patient Location: PACU  Anesthesia Type:General  Level of Consciousness: awake, alert  and oriented  Airway & Oxygen Therapy: Patient Spontanous Breathing and Patient connected to nasal cannula oxygen  Post-op Assessment: Report given to RN and Post -op Vital signs reviewed and stable  Post vital signs: Reviewed and stable  Last Vitals:  Vitals Value Taken Time  BP 97/64 04/24/21 0748  Temp    Pulse 60 04/24/21 0748  Resp 13 04/24/21 0748  SpO2 97 % 04/24/21 0748  Vitals shown include unvalidated device data.  Last Pain:  Vitals:   04/24/21 0714  TempSrc: Oral  PainSc: 0-No pain         Complications: No notable events documented.

## 2021-04-24 NOTE — Interval H&P Note (Signed)
History and Physical Interval Note:  04/24/2021 7:44 AM  Zoe Alvarado  has presented today for surgery, with the diagnosis of   AFib.  The various methods of treatment have been discussed with the patient and family. After consideration of risks, benefits and other options for treatment, the patient has consented to  Procedure(s): CARDIOVERSION (N/A) as a surgical intervention.  The patient's history has been reviewed, patient examined, no change in status, stable for surgery.  I have reviewed the patient's chart and labs.  Questions were answered to the patient's satisfaction.     Arlys John Agbor-Etang

## 2021-04-24 NOTE — Procedures (Signed)
Cardioversion procedure note For atrial fibrillation.  Procedure Details:  Consent: Risks of procedure as well as the alternatives and risks of each were explained to the (patient/caregiver).  Consent for procedure obtained.  Time Out: Verified patient identification, verified procedure, site/side was marked, verified correct patient position, special equipment/implants available, medications/allergies/relevent history reviewed, required imaging and test results available.  Performed  Patient placed on cardiac monitor, pulse oximetry, supplemental oxygen as necessary.   Sedation given: propofol IV per anesthesia team Pacer pads placed anterior and posterior chest.   Cardioverted 2 time(s).   Cardioverted at  200J. Synchronized biphasic Converted to NSR   Evaluation: Findings: Post procedure EKG shows: NSR Complications: None Patient did tolerate procedure well.  Time Spent Directly with the Patient:  35 minutes   Debbe Odea, M.D.

## 2021-04-24 NOTE — Anesthesia Procedure Notes (Signed)
Date/Time: 04/24/2021 7:31 AM Performed by: Ginger Carne, CRNA Pre-anesthesia Checklist: Patient identified, Suction available, Emergency Drugs available, Patient being monitored and Timeout performed Patient Re-evaluated:Patient Re-evaluated prior to induction Oxygen Delivery Method: Nasal cannula Preoxygenation: Pre-oxygenation with 100% oxygen Induction Type: IV induction

## 2021-04-24 NOTE — Anesthesia Preprocedure Evaluation (Signed)
Anesthesia Evaluation  Patient identified by MRN, date of birth, ID band Patient awake    Reviewed: Allergy & Precautions, H&P , NPO status , Patient's Chart, lab work & pertinent test results  History of Anesthesia Complications Negative for: history of anesthetic complications  Airway Mallampati: II  TM Distance: >3 FB Neck ROM: full    Dental  (+) Implants, Teeth Intact, Caps, Dental Advidsory Given Permanent bridges:   Pulmonary neg shortness of breath, neg sleep apnea, neg COPD, Recent URI , Resolved,    Pulmonary exam normal        Cardiovascular Exercise Tolerance: Good hypertension, (-) angina(-) Past MI and (-) Cardiac Stents + dysrhythmias Atrial Fibrillation (-) Valvular Problems/Murmurs     Neuro/Psych negative neurological ROS  negative psych ROS   GI/Hepatic negative GI ROS, Neg liver ROS,   Endo/Other  negative endocrine ROS  Renal/GU      Musculoskeletal   Abdominal   Peds  Hematology negative hematology ROS (+)   Anesthesia Other Findings Past Medical History: 03/20/2016: Acute non-recurrent maxillary sinusitis No date: Allergy No date: Arthritis     Comment:  in thumbs on both hands years: Cataract No date: Hyperlipidemia No date: Hypertension No date: Osteoporosis No date: Pneumonia No date: Prolapse of female pelvic organs  Past Surgical History: No date: ABDOMINAL HYSTERECTOMY No date: CHOLECYSTECTOMY No date: DENTAL SURGERY     Comment:  dental implants No date: DILATION AND CURETTAGE OF UTERUS No date: KNEE SURGERY     Comment:  arthroscopy No date: TONSILLECTOMY  BMI    Body Mass Index: 28.59 kg/m      Reproductive/Obstetrics negative OB ROS                             Anesthesia Physical  Anesthesia Plan  ASA: 2  Anesthesia Plan: General   Post-op Pain Management: Minimal or no pain anticipated   Induction: Intravenous  PONV Risk Score  and Plan: 3 and Propofol infusion and TIVA  Airway Management Planned: Natural Airway and Nasal Cannula  Additional Equipment:   Intra-op Plan:   Post-operative Plan:   Informed Consent: I have reviewed the patients History and Physical, chart, labs and discussed the procedure including the risks, benefits and alternatives for the proposed anesthesia with the patient or authorized representative who has indicated his/her understanding and acceptance.       Plan Discussed with: Anesthesiologist and CRNA  Anesthesia Plan Comments:         Anesthesia Quick Evaluation

## 2021-04-25 ENCOUNTER — Encounter: Payer: Self-pay | Admitting: Cardiology

## 2021-04-30 NOTE — Progress Notes (Signed)
Electrophysiology Office Note:    Date:  05/01/2021   ID:  Zoe BRIMBERRY, DOB 1945-11-01, MRN [id number]  PCP:  [name], PA-C  [facility] Cardiologist:  Debbe Odea, MD  [facility] Electrophysiologist:  Lanier Prude, MD   Referring MD: Debbe Odea, MD   Chief Complaint: AF  History of Present Illness:    Zoe Alvarado is a 75 y.o. female who presents for an evaluation of AF at the request of Dr Azucena Cecil. Their medical history includes HTN, HLD. She last saw Dr Azucena Cecil on 04/04/2021. She takes xarelto for stroke ppx. She was referred for DCCV at the last appointment. This was completed 04/24/2021.  Today she is alone in clinic.  She tells me that thinking back, her episodes of atrial fibrillation likely started in 2020.  She had palpitations.  She was seen at his science fair between 2020 and 2022.  During that sinus.  There was an EKG taken which was apparently normal.  Her palpitations continued intermittently and rarely until July of this year when they became more prominent.  Her heart rates at home would be in the 140s and 150s on blood pressure monitors.  Check she has a [employer] that she monitors her rhythm with.  She saw Dr. Azucena Cecil and was in persistent atrial fibrillation required cardioversion.  Since the cardioversion she has not had a recurrence of her atrial fibrillation.  She is taking Xarelto for stroke prophylaxis without bleeding issues.    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   Cataract years   Hyperlipidemia    Hypertension    Osteoporosis    Pneumonia    Prolapse of female pelvic organs     Past Surgical History:  Procedure Laterality Date   ABDOMINAL HYSTERECTOMY     CARDIOVERSION N/A 04/24/2021   Procedure: CARDIOVERSION;  Surgeon: Debbe Odea, MD;  Location: [facility];  Service: Cardiovascular;  Laterality:  N/A;   CATARACT EXTRACTION W/PHACO Left 11/24/2019   Procedure: CATARACT EXTRACTION PHACO AND INTRAOCULAR LENS PLACEMENT (IOC) LEFT [name];  Surgeon: Lockie Mola, MD;  Location: [facility];  Service: Ophthalmology;  Laterality: Left;  cde 16.31 [place] 1:39 ap 16.5    CATARACT EXTRACTION W/PHACO Right 12/14/2019   Procedure: CATARACT EXTRACTION PHACO AND INTRAOCULAR LENS PLACEMENT (IOC) RIGHT EYHANCE TORIC 3.77 00:41.5 901%;  Surgeon: Lockie Mola, MD;  Location: [facility];  Service: Ophthalmology;  Laterality: Right;   CHOLECYSTECTOMY     DENTAL SURGERY     dental implants   DILATION AND CURETTAGE OF UTERUS     KNEE SURGERY     arthroscopy   TONSILLECTOMY      Current Medications: Current Meds  Medication Sig   alendronate (FOSAMAX) 70 MG tablet Take 70 mg by mouth every Tuesday. Take with a full glass of water on an empty stomach.   Calcium-Vitamin D-Vitamin K (VIACTIV CALCIUM PLUS D) 650-12.5-40 MG-MCG-MCG CHEW Chew 2 each by mouth daily.   estradiol (ESTRACE) 0.5 MG tablet Take 1 tablet (0.5 mg total) by mouth daily.   metoprolol tartrate (LOPRESSOR) 25 MG tablet Take 1 tablet (25 mg total) by mouth 2 (two) times daily.   MULTIPLE VITAMINS-MINERALS PO Take 1 tablet by mouth daily.   Propylene Glycol (SYSTANE COMPLETE) 0.6 % SOLN Apply 1 drop to eye in the morning and at bedtime.  rivaroxaban (XARELTO) 20 MG TABS tablet Take 1 tablet (20 mg total) by mouth daily with supper.   sodium chloride (OCEAN) 0.65 % SOLN nasal spray Place 1 spray into both nostrils daily as needed for congestion.     Allergies:   Codeine and Penicillins   Social History   Socioeconomic History   Marital status: Married    Spouse name: Not on file   Number of children: 3   Years of education: Not on file   Highest education level: Not on file  Occupational History   Not on file  Tobacco Use   Smoking status: Never   Smokeless tobacco: Never  Vaping Use   Vaping Use:  Never used  Substance and Sexual Activity   Alcohol use: No    Alcohol/week: 0.0 standard drinks   Drug use: No   Sexual activity: Yes    Partners: Male    Birth control/protection: Surgical  Other Topics Concern   Not on file  Social History Narrative   2nd marriage   3 living sons, 2 deceased   Social Determinants of Health   Financial Resource Strain: Not on file  Food Insecurity: Not on file  Transportation Needs: Not on file  Physical Activity: Not on file  Stress: Not on file  Social Connections: Not on file     Family History: The patient's family history includes Breast cancer in her maternal grandmother; Breast cancer (age of onset: 67) in her maternal aunt; Cancer in her father, maternal aunt, and maternal grandmother; Diabetes in her brother and mother; Heart attack in her father; Heart disease in her father; Hypertension in her father; Prostate cancer in her father; Vision loss in her father. There is no history of Ovarian cancer or Colon cancer.  ROS:   Please see the history of present illness.    All other systems reviewed and are negative.  EKGs/Labs/Other Studies Reviewed:    The following studies were reviewed today:  03/26/2021 Echo EF50%. RV normal Mildly dilated LA Mild MR Mild-mod TR   EKG:  The ekg ordered today demonstrates sinus rhythm.  Low amplitude QRS.   Recent Labs: 03/13/2021: Magnesium 2.3; TSH 1.54 04/04/2021: BUN 7; Creatinine, Ser 0.58; Hemoglobin 13.8; Platelets 233; Potassium 4.1; Sodium 141  Recent Lipid Panel    Component Value Date/Time   CHOL 165 05/02/2019 0000   CHOL 176 08/03/2015 0827   TRIG 99 05/02/2019 0000   HDL 61 05/02/2019 0000   HDL 63 08/03/2015 0827   CHOLHDL 2.7 05/02/2019 0000   LDLCALC 85 05/02/2019 0000    Physical Exam:    VS:  BP 120/64    Pulse 67    Ht 4\' 11"  (1.499 m)    Wt 139 lb 9.6 oz (63.3 kg)    SpO2 96%    BMI 28.20 kg/m     Wt Readings from Last 3 Encounters:  05/01/21 139 lb 9.6  oz (63.3 kg)  04/24/21 143 lb (64.9 kg)  04/04/21 142 lb (64.4 kg)     GEN:  Well nourished, well developed in no acute distress HEENT: Normal NECK: No JVD; No carotid bruits LYMPHATICS: No lymphadenopathy CARDIAC: RRR, no murmurs, rubs, gallops RESPIRATORY:  Clear to auscultation without rales, wheezing or rhonchi  ABDOMEN: Soft, non-tender, non-distended MUSCULOSKELETAL:  No edema; No deformity  SKIN: Warm and dry NEUROLOGIC:  Alert and oriented x 3 PSYCHIATRIC:  Normal affect       ASSESSMENT:    1. Persistent atrial fibrillation ([place])  2. Primary hypertension    PLAN:    In order of problems listed above:  #Persistent atrial fibrillation On Xarelto for stroke prophylaxis.  In normal rhythm today.  We spent a great deal of time during today's appointment discussing the pathophysiology of atrial fibrillation.  We discussed the importance of stroke risk reduction during atrial fibrillation.  We discussed rhythm control strategies including antiarrhythmic drugs and catheter ablation during today's visit.  I gave her some resources for her to look up when she leaves the office to learn more about A. fib ablation.  She will let us know how she would like to proceed or if she has recurrence of her atrial fibrillation.   #Hypertension Controlled  #Low amplitude QRS  Medication Adjustments/Labs and Tests Ordered: Current medicines are reviewed at length with the patient today.  Concerns regarding medicines are outlined above.  Orders Placed This Encounter  Procedures   EKG 12-Lead   No orders of the defined types were placed in this encounter.    Signed, Rossie Muskrat. Lalla Brothers, MD, [facility], [facility] 05/01/2021 9:18 AM    Electrophysiology [facility]

## 2021-05-01 ENCOUNTER — Encounter: Payer: Self-pay | Admitting: Cardiology

## 2021-05-01 ENCOUNTER — Ambulatory Visit: Payer: Medicare Other | Admitting: Cardiology

## 2021-05-01 ENCOUNTER — Other Ambulatory Visit: Payer: Self-pay

## 2021-05-01 VITALS — BP 120/64 | HR 67 | Ht 59.0 in | Wt 139.6 lb

## 2021-05-01 DIAGNOSIS — I4819 Other persistent atrial fibrillation: Secondary | ICD-10-CM | POA: Diagnosis not present

## 2021-05-01 DIAGNOSIS — I10 Essential (primary) hypertension: Secondary | ICD-10-CM

## 2021-05-01 NOTE — Patient Instructions (Addendum)
Medication Instructions:  Your physician recommends that you continue on your current medications as directed. Please refer to the Current Medication list given to you today. *If you need a refill on your cardiac medications before your next appointment, please call your pharmacy*  Lab Work: None ordered. If you have labs (blood work) drawn today and your tests are completely normal, you will receive your results only by: MyChart Message (if you have MyChart) OR A paper copy in the mail If you have any lab test that is abnormal or we need to change your treatment, we will call you to review the results.  Testing/Procedures: None ordered.  Follow-Up: At [facility], you and your health needs are our priority.  As part of our continuing mission to provide you with exceptional heart care, we have created designated Provider Care Teams.  These Care Teams include your primary Cardiologist (physician) and Advanced Practice Providers (APPs -  Physician Assistants and Nurse Practitioners) who all work together to provide you with the care you need, when you need it.  If you are interested in scheduling an atrial fibrillation ablation please call.  Cardiac Ablation Cardiac ablation is a procedure to destroy, or ablate, a small amount of heart tissue in very specific places. The heart has many electrical connections. Sometimes these connections are abnormal and can cause the heart to beat very fast or irregularly. Ablating some of the areas that cause problems can improve the heart's rhythm or return it to normal. Ablation may be done for people who: Have Wolff-Parkinson-White syndrome. Have fast heart rhythms (tachycardia). Have taken medicines for an abnormal heart rhythm (arrhythmia) that were not effective or caused side effects. Have a high-risk heartbeat that may be life-threatening. During the procedure, a small incision is made in the neck or the groin, and a long, thin tube (catheter) is  inserted into the incision and moved to the heart. Small devices (electrodes) on the tip of the catheter will send out electrical currents. A type of X-ray (fluoroscopy) will be used to help guide the catheter and to provide images of the heart. Tell a health care provider about: Any allergies you have. All medicines you are taking, including vitamins, herbs, eye drops, creams, and over-the-counter medicines. Any problems you or family members have had with anesthetic medicines. Any blood disorders you have. Any surgeries you have had. Any medical conditions you have, such as kidney failure. Whether you are pregnant or may be pregnant. What are the risks? Generally, this is a safe procedure. However, problems may occur, including: Infection. Bruising and bleeding at the catheter insertion site. Bleeding into the chest, especially into the sac that surrounds the heart. This is a serious complication. Stroke or blood clots. Damage to nearby structures or organs. Allergic reaction to medicines or dyes. Need for a permanent pacemaker if the normal electrical system is damaged. A pacemaker is a small computer that sends electrical signals to the heart and helps your heart beat normally. The procedure not being fully effective. This may not be recognized until months later. Repeat ablation procedures are sometimes done. What happens before the procedure? Medicines Ask your health care provider about: Changing or stopping your regular medicines. This is especially important if you are taking diabetes medicines or blood thinners. Taking medicines such as aspirin and ibuprofen. These medicines can thin your blood. Do not take these medicines unless your health care provider tells you to take them. Taking over-the-counter medicines, vitamins, herbs, and supplements. General instructions Follow  instructions from your health care provider about eating or drinking restrictions. Plan to have someone  take you home from the hospital or clinic. If you will be going home right after the procedure, plan to have someone with you for 24 hours. Ask your health care provider what steps will be taken to prevent infection. What happens during the procedure?  An IV will be inserted into one of your veins. You will be given a medicine to help you relax (sedative). The skin on your neck or groin will be numbed. An incision will be made in your neck or your groin. A needle will be inserted through the incision and into a large vein in your neck or groin. A catheter will be inserted into the needle and moved to your heart. Dye may be injected through the catheter to help your surgeon see the area of the heart that needs treatment. Electrical currents will be sent from the catheter to ablate heart tissue in desired areas. There are three types of energy that may be used to do this: Heat (radiofrequency energy). Laser energy. Extreme cold (cryoablation). When the tissue has been ablated, the catheter will be removed. Pressure will be held on the insertion area to prevent a lot of bleeding. A bandage (dressing) will be placed over the insertion area. The exact procedure may vary among health care providers and hospitals. What happens after the procedure? Your blood pressure, heart rate, breathing rate, and blood oxygen level will be monitored until you leave the hospital or clinic. Your insertion area will be monitored for bleeding. You will need to lie still for a few hours to ensure that you do not bleed from the insertion area. Do not drive for 24 hours or as long as told by your health care provider. Summary Cardiac ablation is a procedure to destroy, or ablate, a small amount of heart tissue using an electrical current. This procedure can improve the heart rhythm or return it to normal. Tell your health care provider about any medical conditions you may have and all medicines you are taking to treat  them. This is a safe procedure, but problems may occur. Problems may include infection, bruising, damage to nearby organs or structures, or allergic reactions to medicines. Follow your health care provider's instructions about eating and drinking before the procedure. You may also be told to change or stop some of your medicines. After the procedure, do not drive for 24 hours or as long as told by your health care provider. This information is not intended to replace advice given to you by your health care provider. Make sure you discuss any questions you have with your health care provider. Document Revised: 03/14/2019 Document Reviewed: 03/14/2019 Elsevier Patient Education  [place].

## 2021-05-06 ENCOUNTER — Telehealth: Payer: Self-pay | Admitting: Cardiology

## 2021-05-06 DIAGNOSIS — I4819 Other persistent atrial fibrillation: Secondary | ICD-10-CM

## 2021-05-06 IMAGING — MG MM DIGITAL SCREENING BILAT W/ TOMO W/ CAD
6 of 10 series · 6 of 30 positions shown · non-contrast
Comparison: Previous exam(s).

CLINICAL DATA: Screening.

EXAM:
DIGITAL SCREENING BILATERAL MAMMOGRAM WITH TOMO AND CAD

[R MLO synth-2D]
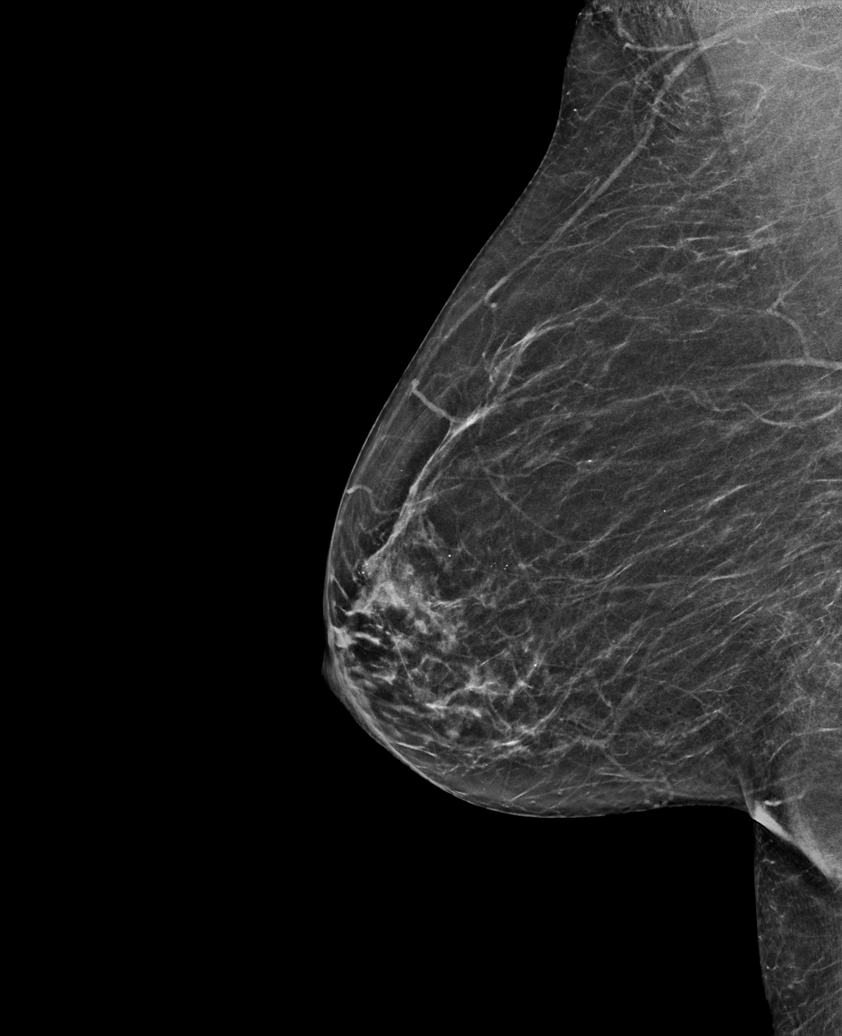

[L CC synth-2D]
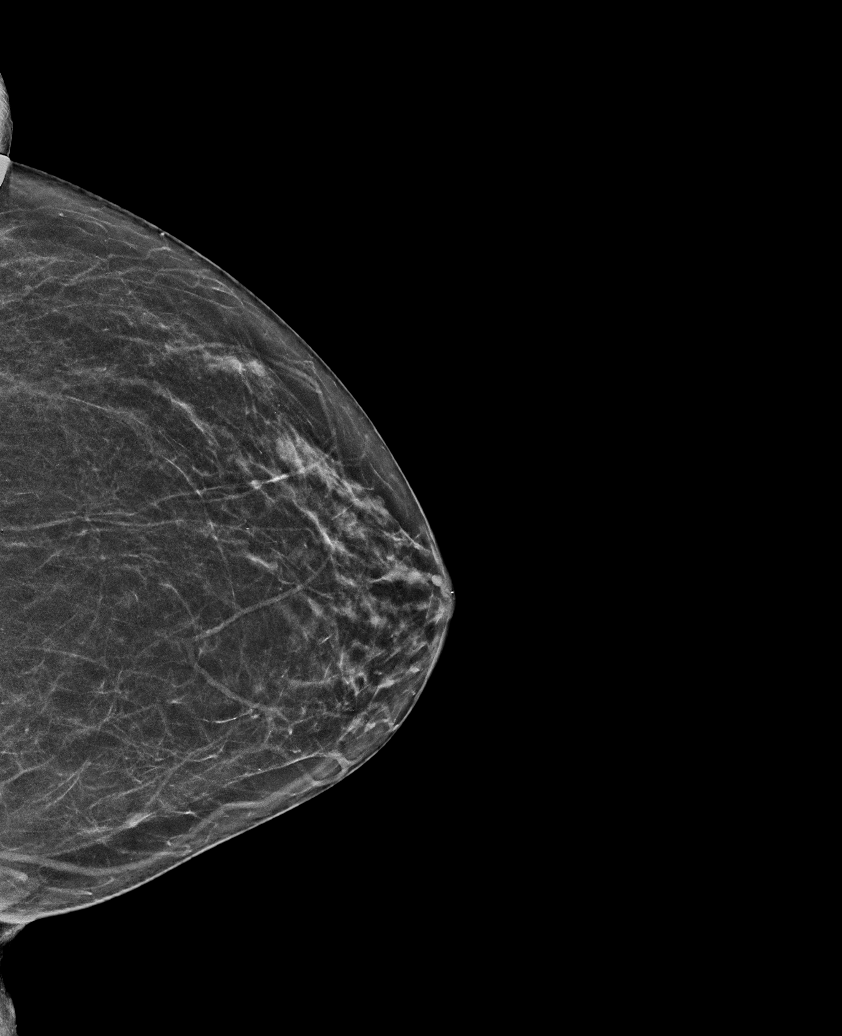

[L MLO synth-2D (1 of 2)]
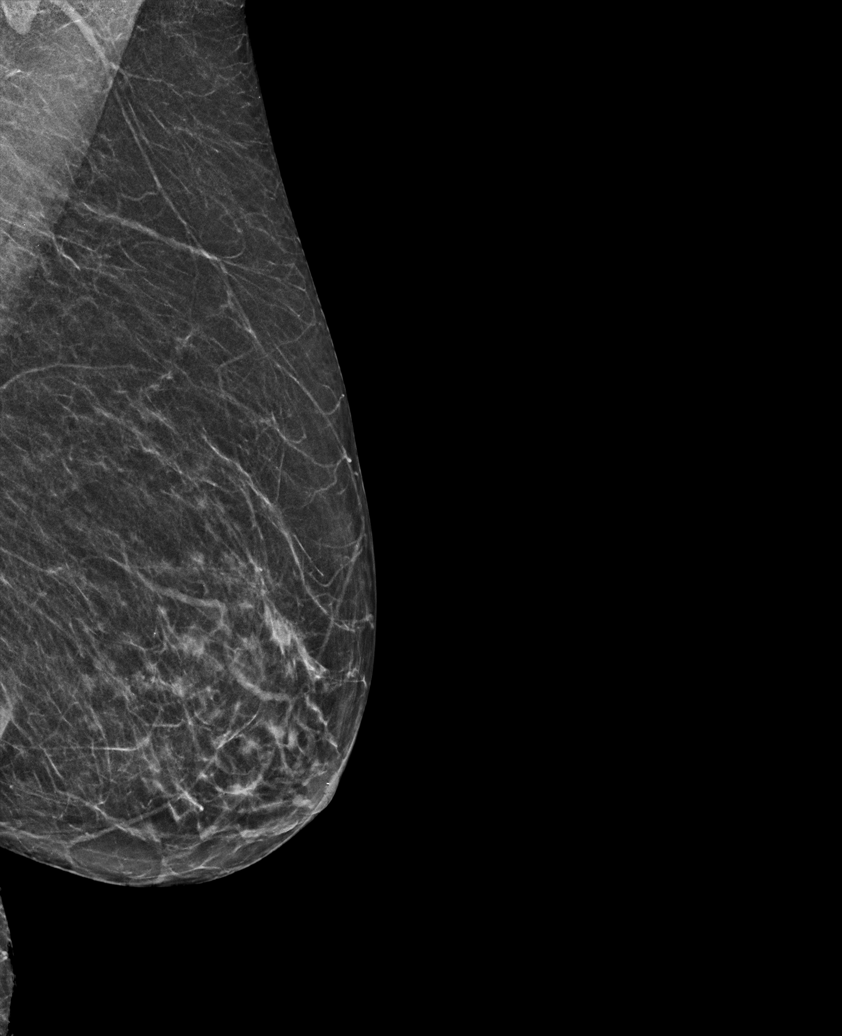

[L MLO synth-2D (2 of 2)]
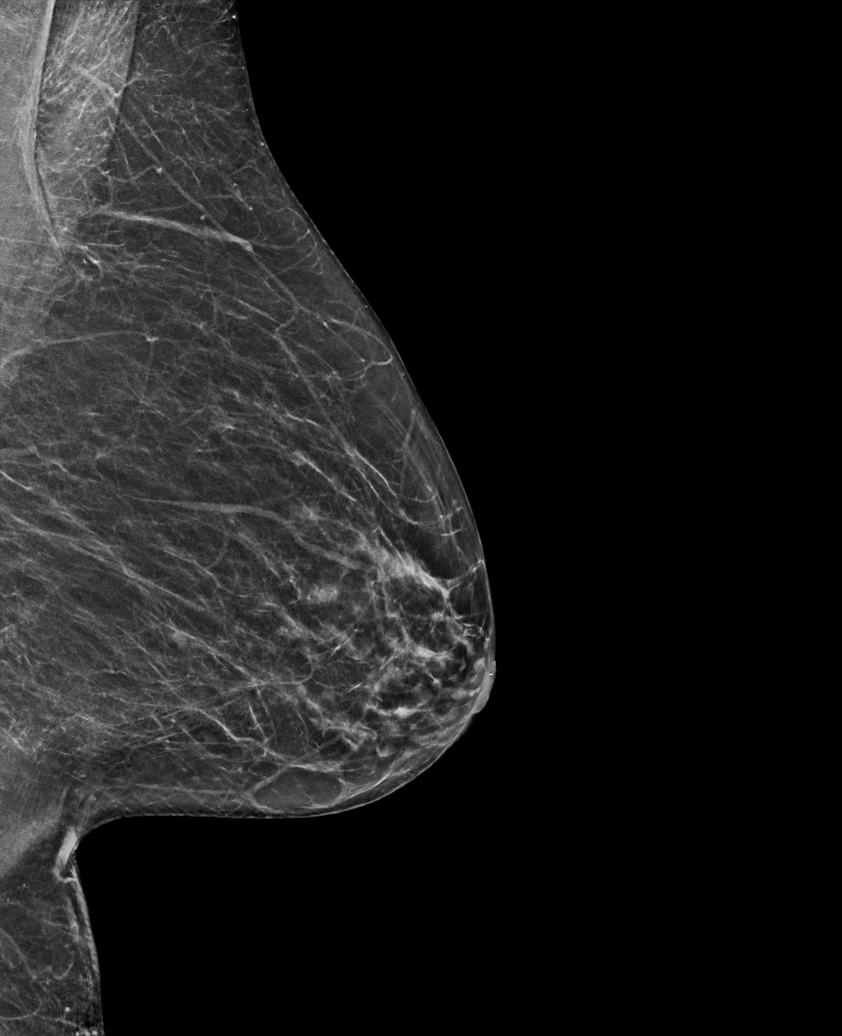

[R CC synth-2D]
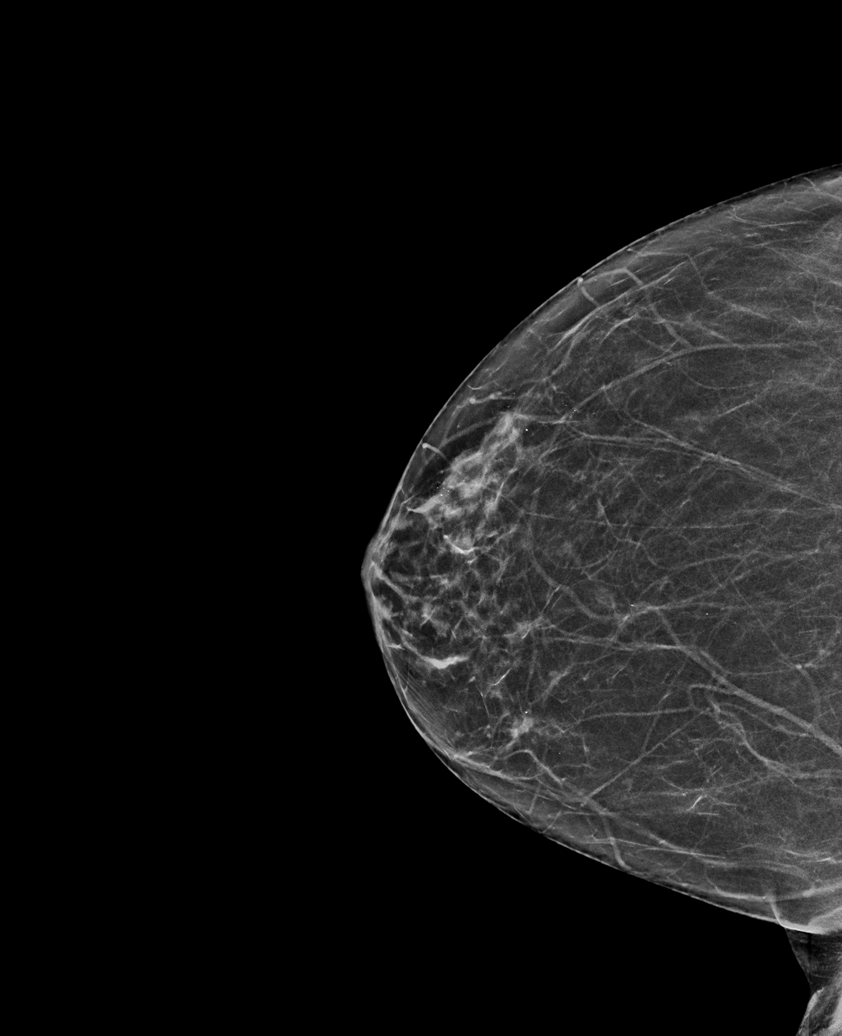

[L CC tomo · tomo slice 27/53.0]
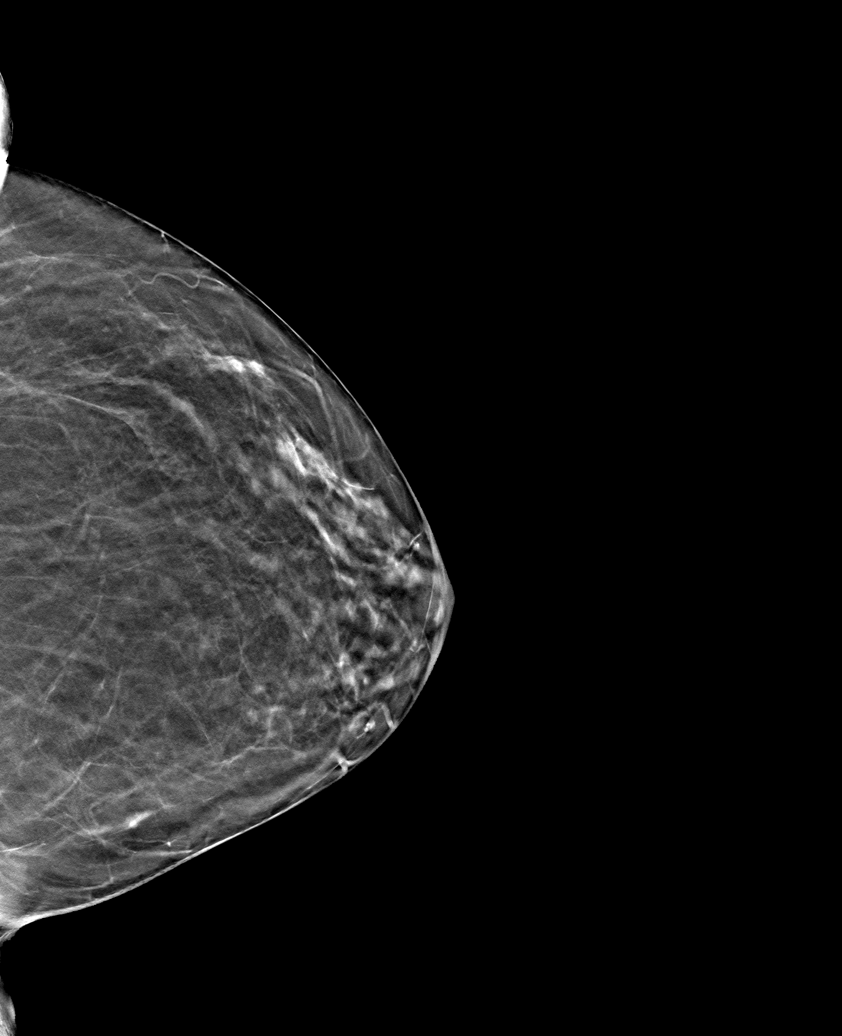

[6 of 30 positions shown; findings below may reference images not displayed]

ACR Breast Density Category b: There are scattered areas of
fibroglandular density.
FINDINGS: There are no findings suspicious for malignancy. Images were
processed with CAD.
IMPRESSION: No mammographic evidence of malignancy. A result letter of this
screening mammogram will be mailed directly to the patient.

RECOMMENDATION:
Screening mammogram in one year. (Code:[id number])

BI-RADS CATEGORY  1: Negative.

## 2021-05-06 NOTE — Telephone Encounter (Signed)
Patient calling to discuss possible ablation with Boneta Lucks RN since she is now back in afib per [place] device .   If no ans call cell.

## 2021-05-07 ENCOUNTER — Encounter: Payer: Self-pay | Admitting: Cardiology

## 2021-05-07 NOTE — Telephone Encounter (Signed)
Left message requesting  call back.

## 2021-05-07 NOTE — Telephone Encounter (Signed)
Returned call to Pt.  Pt scheduled for afib ablation on June 13, 2021 at 10:30 am.  Will get labs January 4.  Message sent to schedule CT.  Work up complete.  Pt states afib has returned post DCCV.  She discovered this via her [place].  She states heart rates are ranging 80-100 BPM.  She does not have any symptoms.  States she only notices if she is walking uphill she gets winded more easily.  Advised would forward to Dr. Lalla Brothers if he advises any changes prior to her ablation.

## 2021-05-14 ENCOUNTER — Telehealth: Payer: Self-pay | Admitting: Cardiology

## 2021-05-14 NOTE — Telephone Encounter (Signed)
Patient was recently diagnosed with afib She would like to know if there are any issues with sexual activity Please call to discuss

## 2021-05-14 NOTE — Telephone Encounter (Signed)
Returned call to Pt.  Advised sexual activity is ok.  Pt thanked nurse for call back.

## 2021-05-22 ENCOUNTER — Other Ambulatory Visit: Payer: Self-pay

## 2021-05-22 ENCOUNTER — Other Ambulatory Visit (INDEPENDENT_AMBULATORY_CARE_PROVIDER_SITE_OTHER): Payer: Medicare Other

## 2021-05-22 DIAGNOSIS — I4819 Other persistent atrial fibrillation: Secondary | ICD-10-CM | POA: Diagnosis not present

## 2021-05-23 LAB — CBC WITH DIFFERENTIAL/PLATELET
Basophils Absolute: 0.1 10*3/uL (ref 0.0–0.2)
Basos: 1 %
EOS (ABSOLUTE): 0.1 10*3/uL (ref 0.0–0.4)
Eos: 2 %
Hematocrit: 41.1 % (ref 34.0–46.6)
Hemoglobin: 13.8 g/dL (ref 11.1–15.9)
Immature Grans (Abs): 0 10*3/uL (ref 0.0–0.1)
Immature Granulocytes: 0 %
Lymphocytes Absolute: 1.3 10*3/uL (ref 0.7–3.1)
Lymphs: 19 %
MCH: 29.1 pg (ref 26.6–33.0)
MCHC: 33.6 g/dL (ref 31.5–35.7)
MCV: 87 fL (ref 79–97)
Monocytes Absolute: 0.5 10*3/uL (ref 0.1–0.9)
Monocytes: 8 %
Neutrophils Absolute: 5 10*3/uL (ref 1.4–7.0)
Neutrophils: 70 %
Platelets: 204 10*3/uL (ref 150–450)
RBC: 4.74 x10E6/uL (ref 3.77–5.28)
RDW: 12.3 % (ref 11.7–15.4)
WBC: 7 10*3/uL (ref 3.4–10.8)

## 2021-05-23 LAB — BASIC METABOLIC PANEL
BUN/Creatinine Ratio: 19 (ref 12–28)
BUN: 11 mg/dL (ref 8–27)
CO2: 21 mmol/L (ref 20–29)
Calcium: 9.2 mg/dL (ref 8.7–10.3)
Chloride: 106 mmol/L (ref 96–106)
Creatinine, Ser: 0.57 mg/dL (ref 0.57–1.00)
Glucose: 89 mg/dL (ref 70–99)
Potassium: 4.3 mmol/L (ref 3.5–5.2)
Sodium: 139 mmol/L (ref 134–144)
eGFR: 96 mL/min/{1.73_m2} (ref >59)

## 2021-05-31 ENCOUNTER — Telehealth (HOSPITAL_COMMUNITY): Payer: Self-pay | Admitting: Emergency Medicine

## 2021-05-31 NOTE — Telephone Encounter (Signed)
Attempted to call patient regarding upcoming cardiac CT appointment. °Left message on voicemail with name and callback number °Stephonie Wilcoxen RN Navigator Cardiac Imaging °[facility] °[phone] Office °[phone] Cell ° °

## 2021-05-31 NOTE — Telephone Encounter (Signed)
Reaching out to patient to offer assistance regarding upcoming cardiac imaging study; pt verbalizes understanding of appt date/time, parking situation and where to check in, pre-test NPO status and medications ordered, and verified current allergies; name and call back number provided for further questions should they arise Marchia Bond RN Navigator Cardiac Imaging [facility] [phone] office [phone] cell   Denies iv issues 50mg  metoprolol tartrate 2 hr prior to scan Arrive 745a

## 2021-06-03 ENCOUNTER — Ambulatory Visit: Payer: Medicare Other | Admitting: Cardiology

## 2021-06-03 ENCOUNTER — Other Ambulatory Visit: Payer: Self-pay

## 2021-06-03 ENCOUNTER — Ambulatory Visit
Admission: RE | Admit: 2021-06-03 | Discharge: 2021-06-03 | Disposition: A | Discharge status: Home / Self Care | Payer: Medicare Other | Source: Ambulatory Visit | Attending: Cardiology | Admitting: Cardiology

## 2021-06-03 DIAGNOSIS — I4819 Other persistent atrial fibrillation: Secondary | ICD-10-CM

## 2021-06-03 MED ORDER — IOHEXOL 350 MG/ML SOLN
75.0000 mL | Freq: Once | INTRAVENOUS | Status: AC | PRN
  Administered 2021-06-03: 75 mL via INTRAVENOUS

## 2021-06-03 MED ORDER — METOPROLOL TARTRATE 5 MG/5ML IV SOLN
10.0000 mg | Freq: Once | INTRAVENOUS | Status: AC
Start: 2021-06-03 — End: 2021-06-03
  Administered 2021-06-03: 10 mg via INTRAVENOUS

## 2021-06-03 NOTE — Progress Notes (Signed)
Patient tolerated CT well. DVital signs stable encourage to drink water throughout day.Reasons explained and verbalized understanding. Ambulated steady gait.

## 2021-06-12 NOTE — Pre-Procedure Instructions (Signed)
Instructed patient on the following items: Arrival time 0830 Nothing to eat or drink after midnight No meds AM of procedure Responsible person to drive you home and stay with you for 24 hrs  Have you missed any doses of anti-coagulant Xarelto- don't take dose in the morning

## 2021-06-13 ENCOUNTER — Encounter (HOSPITAL_COMMUNITY): Payer: Self-pay | Admitting: Cardiology

## 2021-06-13 ENCOUNTER — Ambulatory Visit (HOSPITAL_COMMUNITY): Payer: Medicare Other | Admitting: Anesthesiology

## 2021-06-13 ENCOUNTER — Ambulatory Visit (HOSPITAL_COMMUNITY)
Admission: RE | Admit: 2021-06-13 | Discharge: 2021-06-13 | Disposition: A | Discharge status: Home / Self Care | Payer: Medicare Other | Attending: Cardiology | Admitting: Cardiology

## 2021-06-13 ENCOUNTER — Ambulatory Visit (HOSPITAL_COMMUNITY)
Admission: RE | Disposition: A | Discharge status: Home / Self Care | Payer: Self-pay | Source: Home / Self Care | Attending: Cardiology

## 2021-06-13 ENCOUNTER — Other Ambulatory Visit: Payer: Self-pay

## 2021-06-13 DIAGNOSIS — I4892 Unspecified atrial flutter: Secondary | ICD-10-CM | POA: Insufficient documentation

## 2021-06-13 DIAGNOSIS — E785 Hyperlipidemia, unspecified: Secondary | ICD-10-CM | POA: Insufficient documentation

## 2021-06-13 DIAGNOSIS — I10 Essential (primary) hypertension: Secondary | ICD-10-CM | POA: Diagnosis not present

## 2021-06-13 DIAGNOSIS — Z8673 Personal history of transient ischemic attack (TIA), and cerebral infarction without residual deficits: Secondary | ICD-10-CM | POA: Insufficient documentation

## 2021-06-13 DIAGNOSIS — I483 Typical atrial flutter: Secondary | ICD-10-CM | POA: Diagnosis not present

## 2021-06-13 DIAGNOSIS — I4819 Other persistent atrial fibrillation: Secondary | ICD-10-CM | POA: Insufficient documentation

## 2021-06-13 HISTORY — PX: ATRIAL FIBRILLATION ABLATION: EP1191

## 2021-06-13 LAB — POCT ACTIVATED CLOTTING TIME
Activated Clotting Time: 347 seconds
Activated Clotting Time: 335 seconds
Activated Clotting Time: 365 seconds
Activated Clotting Time: 341 seconds

## 2021-06-13 SURGERY — ATRIAL FIBRILLATION ABLATION
Anesthesia: General

## 2021-06-13 MED ORDER — FENTANYL CITRATE (PF) 250 MCG/5ML IJ SOLN
INTRAMUSCULAR | Status: DC | PRN
  Administered 2021-06-13 (×2): 50 ug via INTRAVENOUS

## 2021-06-13 MED ORDER — ISOPROTERENOL HCL 0.2 MG/ML IJ SOLN
INTRAMUSCULAR | Status: AC
  Filled 2021-06-13: qty 5

## 2021-06-13 MED ORDER — PHENYLEPHRINE HCL-NACL 20-0.9 MG/250ML-% IV SOLN
INTRAVENOUS | Status: DC | PRN
  Administered 2021-06-13: 25 ug/min via INTRAVENOUS

## 2021-06-13 MED ORDER — RIVAROXABAN 20 MG PO TABS
20.0000 mg | ORAL_TABLET | Freq: Every day | ORAL | Status: DC
  Administered 2021-06-13: 20 mg via ORAL
  Filled 2021-06-13: qty 1

## 2021-06-13 MED ORDER — RIVAROXABAN 20 MG PO TABS: 20.0000 mg | ORAL_TABLET | Freq: Every day | ORAL | 5 refills | Status: DC

## 2021-06-13 MED ORDER — ONDANSETRON HCL 4 MG/2ML IJ SOLN
INTRAMUSCULAR | Status: DC | PRN
  Administered 2021-06-13: 4 mg via INTRAVENOUS

## 2021-06-13 MED ORDER — HEPARIN SODIUM (PORCINE) 1000 UNIT/ML IJ SOLN
INTRAMUSCULAR | Status: AC
  Filled 2021-06-13: qty 10

## 2021-06-13 MED ORDER — PHENYLEPHRINE 40 MCG/ML (10ML) SYRINGE FOR IV PUSH (FOR BLOOD PRESSURE SUPPORT)
PREFILLED_SYRINGE | INTRAVENOUS | Status: DC | PRN
  Administered 2021-06-13: 120 ug via INTRAVENOUS

## 2021-06-13 MED ORDER — SODIUM CHLORIDE 0.9 % IV SOLN: INTRAVENOUS | Status: DC

## 2021-06-13 MED ORDER — PROTAMINE SULFATE 10 MG/ML IV SOLN
INTRAVENOUS | Status: DC | PRN
  Administered 2021-06-13: 10 mg via INTRAVENOUS
  Administered 2021-06-13: 25 mg via INTRAVENOUS

## 2021-06-13 MED ORDER — COLCHICINE 0.6 MG PO TABS
0.6000 mg | ORAL_TABLET | Freq: Two times a day (BID) | ORAL | Status: DC
  Administered 2021-06-13: 0.6 mg via ORAL
  Filled 2021-06-13 (×2): qty 1

## 2021-06-13 MED ORDER — SODIUM CHLORIDE 0.9 % IV SOLN: 250.0000 mL | INTRAVENOUS | Status: DC | PRN

## 2021-06-13 MED ORDER — METOPROLOL TARTRATE 25 MG PO TABS
25.0000 mg | ORAL_TABLET | Freq: Two times a day (BID) | ORAL | Status: DC
  Administered 2021-06-13: 25 mg via ORAL
  Filled 2021-06-13: qty 1

## 2021-06-13 MED ORDER — HEPARIN (PORCINE) IN NACL 1000-0.9 UT/500ML-% IV SOLN
INTRAVENOUS | Status: AC
  Filled 2021-06-13: qty 500

## 2021-06-13 MED ORDER — COLCHICINE 0.6 MG PO TABS: 0.6000 mg | ORAL_TABLET | Freq: Two times a day (BID) | ORAL | 0 refills | Status: DC

## 2021-06-13 MED ORDER — HEPARIN (PORCINE) IN NACL 1000-0.9 UT/500ML-% IV SOLN
INTRAVENOUS | Status: DC | PRN
  Administered 2021-06-13 (×3): 500 mL

## 2021-06-13 MED ORDER — SODIUM CHLORIDE 0.9% FLUSH: 3.0000 mL | INTRAVENOUS | Status: DC | PRN

## 2021-06-13 MED ORDER — SUGAMMADEX SODIUM 200 MG/2ML IV SOLN
INTRAVENOUS | Status: DC | PRN
  Administered 2021-06-13: 200 mg via INTRAVENOUS

## 2021-06-13 MED ORDER — ROCURONIUM BROMIDE 10 MG/ML (PF) SYRINGE
PREFILLED_SYRINGE | INTRAVENOUS | Status: DC | PRN
  Administered 2021-06-13: 70 mg via INTRAVENOUS

## 2021-06-13 MED ORDER — ISOPROTERENOL HCL 0.2 MG/ML IJ SOLN
INTRAVENOUS | Status: DC | PRN
  Administered 2021-06-13: 2 ug/min via INTRAVENOUS

## 2021-06-13 MED ORDER — LIDOCAINE 2% (20 MG/ML) 5 ML SYRINGE
INTRAMUSCULAR | Status: DC | PRN
  Administered 2021-06-13: 60 mg via INTRAVENOUS

## 2021-06-13 MED ORDER — SODIUM CHLORIDE 0.9% FLUSH: 3.0000 mL | Freq: Two times a day (BID) | INTRAVENOUS | Status: DC

## 2021-06-13 MED ORDER — ONDANSETRON HCL 4 MG/2ML IJ SOLN: 4.0000 mg | Freq: Four times a day (QID) | INTRAMUSCULAR | Status: DC | PRN

## 2021-06-13 MED ORDER — HEPARIN SODIUM (PORCINE) 1000 UNIT/ML IJ SOLN
INTRAMUSCULAR | Status: DC | PRN
  Administered 2021-06-13: 2000 [IU] via INTRAVENOUS
  Administered 2021-06-13: 9000 [IU] via INTRAVENOUS

## 2021-06-13 MED ORDER — ACETAMINOPHEN 325 MG PO TABS
650.0000 mg | ORAL_TABLET | ORAL | Status: DC | PRN
  Filled 2021-06-13: qty 2

## 2021-06-13 MED ORDER — PROPOFOL 10 MG/ML IV BOLUS
INTRAVENOUS | Status: DC | PRN
  Administered 2021-06-13: 110 mg via INTRAVENOUS

## 2021-06-13 MED ORDER — PANTOPRAZOLE SODIUM 40 MG PO TBEC: 40.0000 mg | DELAYED_RELEASE_TABLET | Freq: Every day | ORAL | 0 refills | Status: DC

## 2021-06-13 MED ORDER — DEXAMETHASONE SODIUM PHOSPHATE 10 MG/ML IJ SOLN
INTRAMUSCULAR | Status: DC | PRN
  Administered 2021-06-13: 10 mg via INTRAVENOUS

## 2021-06-13 MED ORDER — PANTOPRAZOLE SODIUM 40 MG PO TBEC
40.0000 mg | DELAYED_RELEASE_TABLET | Freq: Every day | ORAL | Status: DC
  Administered 2021-06-13: 40 mg via ORAL
  Filled 2021-06-13: qty 1

## 2021-06-13 MED ORDER — LACTATED RINGERS IV SOLN: INTRAVENOUS | Status: DC | PRN

## 2021-06-13 MED ORDER — HEPARIN SODIUM (PORCINE) 1000 UNIT/ML IJ SOLN
INTRAMUSCULAR | Status: DC | PRN
  Administered 2021-06-13: 1000 [IU] via INTRAVENOUS

## 2021-06-13 MED ORDER — ESMOLOL HCL 100 MG/10ML IV SOLN
INTRAVENOUS | Status: DC | PRN
Start: 2021-06-13 — End: 2021-06-13
  Administered 2021-06-13: 20 mg via INTRAVENOUS

## 2021-06-13 SURGICAL SUPPLY — 18 items
CATH 8FR REPROCESSED SOUNDSTAR (CATHETERS) ×2 IMPLANT
CATH 8FR SOUNDSTAR REPROCESSED (CATHETERS) IMPLANT
CATH OCTARAY 2.0 F 3-3-3-3-3 (CATHETERS) ×1 IMPLANT
CATH S CIRCA THERM PROBE 10F (CATHETERS) ×1 IMPLANT
CATH SMTCH THERMOCOOL SF DF (CATHETERS) ×1 IMPLANT
CATH WEBSTER BI DIR CS D-F CRV (CATHETERS) ×1 IMPLANT
CLOSURE PERCLOSE PROSTYLE (VASCULAR PRODUCTS) ×3 IMPLANT
COVER SWIFTLINK CONNECTOR (BAG) ×2 IMPLANT
PACK EP LATEX FREE (CUSTOM PROCEDURE TRAY) ×2
PACK EP LF (CUSTOM PROCEDURE TRAY) ×1 IMPLANT
PAD DEFIB RADIO PHYSIO CONN (PAD) ×2 IMPLANT
PATCH CARTO3 (PAD) ×1 IMPLANT
SHEATH BAYLIS TRANSSEPTAL 98CM (NEEDLE) ×1 IMPLANT
SHEATH CARTO VIZIGO SM CVD (SHEATH) ×1 IMPLANT
SHEATH PINNACLE 8F 10CM (SHEATH) ×2 IMPLANT
SHEATH PINNACLE 9F 10CM (SHEATH) ×1 IMPLANT
SHEATH PROBE COVER 6X72 (BAG) ×1 IMPLANT
TUBING SMART ABLATE COOLFLOW (TUBING) ×1 IMPLANT

## 2021-06-13 NOTE — Progress Notes (Signed)
Purewick placed at pt request to "use the bathroom", tolerated well, safety maintained

## 2021-06-13 NOTE — Anesthesia Procedure Notes (Signed)
Procedure Name: Intubation Date/Time: 06/13/2021 11:11 AM Performed by: Quentin Ore, CRNA Pre-anesthesia Checklist: Patient identified, Emergency Drugs available, Suction available and Patient being monitored Patient Re-evaluated:Patient Re-evaluated prior to induction Oxygen Delivery Method: Circle System Utilized Preoxygenation: Pre-oxygenation with 100% oxygen Induction Type: IV induction Ventilation: Mask ventilation without difficulty Laryngoscope Size: Miller and 2 Grade View: Grade I Tube type: Oral Tube size: 7.0 mm Number of attempts: 1 Airway Equipment and Method: Stylet Placement Confirmation: ETT inserted through vocal cords under direct vision, positive ETCO2 and breath sounds checked- equal and bilateral Secured at: 21 cm Tube secured with: Tape Dental Injury: Teeth and Oropharynx as per pre-operative assessment

## 2021-06-13 NOTE — H&P (Signed)
Electrophysiology Office Note:     Date:  05/01/2021    ID:  Zoe Alvarado, DOB 01-Jul-1945, MRN [id number]   PCP:  Delsa Grana, [facility] Cardiologist:  Kate Sable, MD  [facility] Electrophysiologist:  Vickie Epley, MD    Referring MD: Kate Sable, MD    Chief Complaint: AF   History of Present Illness:     Zoe Alvarado is a 76 y.o. female who presents for an evaluation of AF at the request of Dr Garen Lah. Their medical history includes HTN, HLD. She last saw Dr Garen Lah on 04/04/2021. She takes xarelto for stroke ppx. She was referred for DCCV at the last appointment. This was completed 04/24/2021.  Today she is alone in clinic.  She tells me that thinking back, her episodes of atrial fibrillation likely started in 2020.  She had palpitations.  She was seen at his science fair between 2020 and 2022.  During that sinus.  There was an EKG taken which was apparently normal.  Her palpitations continued intermittently and rarely until July of this year when they became more prominent.  Her heart rates at home would be in the 140s and 150s on blood pressure monitors.  Check she has a [employer] that she monitors her rhythm with.  She saw Dr. Garen Lah and was in persistent atrial fibrillation required cardioversion.  Since the cardioversion she has not had a recurrence of her atrial fibrillation.  She is taking Xarelto for stroke prophylaxis without bleeding issues.     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   Cataract years   Hyperlipidemia     Hypertension     Osteoporosis     Pneumonia     Prolapse of female pelvic organs             Past Surgical History:  Procedure Laterality Date   ABDOMINAL HYSTERECTOMY       CARDIOVERSION N/A 04/24/2021    Procedure: CARDIOVERSION;  Surgeon: Kate Sable, MD;  Location:  [facility] ORS;  Service: Cardiovascular;  Laterality: N/A;   CATARACT EXTRACTION W/PHACO Left 11/24/2019    Procedure: CATARACT EXTRACTION PHACO AND INTRAOCULAR LENS PLACEMENT ([place]) LEFT Tyler Deis;  Surgeon: Leandrew Koyanagi, MD;  Location: [facility] OR[facility];  Service: Ophthalmology;  Laterality: Left;  cde 16.31 [place] 1:39 ap 16.5     CATARACT EXTRACTION W/PHACO Right 12/14/2019    Procedure: CATARACT EXTRACTION PHACO AND INTRAOCULAR LENS PLACEMENT ([facility]) RIGHT EYHANCE TORIC 3.77 00:41.5 901%;  Surgeon: Leandrew Koyanagi, MD;  Location: [place];  Service: Ophthalmology;  Laterality: Right;   CHOLECYSTECTOMY       DENTAL SURGERY        dental implants   DILATION AND CURETTAGE OF UTERUS       KNEE SURGERY        arthroscopy   TONSILLECTOMY          Current Medications: Active Medications      Current Meds  Medication Sig   alendronate (FOSAMAX) 70 MG tablet Take 70 mg by mouth every Tuesday. Take with a full glass of water on an empty stomach.   Calcium-Vitamin D-Vitamin Zoe (VIACTIV CALCIUM PLUS D) 650-12.5-40 MG-MCG-MCG CHEW Chew 2 each by mouth daily.   estradiol (ESTRACE) 0.5 MG tablet Take 1  tablet (0.5 mg total) by mouth daily.   metoprolol tartrate (LOPRESSOR) 25 MG tablet Take 1 tablet (25 mg total) by mouth 2 (two) times daily.   MULTIPLE VITAMINS-MINERALS PO Take 1 tablet by mouth daily.   Propylene Glycol (SYSTANE COMPLETE) 0.6 % SOLN Apply 1 drop to eye in the morning and at bedtime.   rivaroxaban (XARELTO) 20 MG TABS tablet Take 1 tablet (20 mg total) by mouth daily with supper.   sodium chloride (OCEAN) 0.65 % SOLN nasal spray Place 1 spray into both nostrils daily as needed for congestion.        Allergies:   Codeine and Penicillins    Social History         Socioeconomic History   Marital status: Married      Spouse name: Not on file   Number of children: 3   Years of education: Not on file   Highest education level: Not on file  Occupational History   Not  on file  Tobacco Use   Smoking status: Never   Smokeless tobacco: Never  Vaping Use   Vaping Use: Never used  Substance and Sexual Activity   Alcohol use: No      Alcohol/week: 0.0 standard drinks   Drug use: No   Sexual activity: Yes      Partners: Male      Birth control/protection: Surgical  Other Topics Concern   Not on file  Social History Narrative    2nd marriage    3 living sons, 2 deceased    Social Determinants of Health    Financial Resource Strain: Not on file  Food Insecurity: Not on file  Transportation Needs: Not on file  Physical Activity: Not on file  Stress: Not on file  Social Connections: Not on file      Family History: The patient's family history includes Breast cancer in her maternal grandmother; Breast cancer (age of onset: 89) in her maternal aunt; Cancer in her father, maternal aunt, and maternal grandmother; Diabetes in her brother and mother; Heart attack in her father; Heart disease in her father; Hypertension in her father; Prostate cancer in her father; Vision loss in her father. There is no history of Ovarian cancer or Colon cancer.   ROS:   Please see the history of present illness.    All other systems reviewed and are negative.   EKGs/Labs/Other Studies Reviewed:     The following studies were reviewed today:   03/26/2021 Echo EF50%. RV normal Mildly dilated LA Mild MR Mild-mod TR     EKG:  The ekg ordered today demonstrates sinus rhythm.  Low amplitude QRS.     Recent Labs: 03/13/2021: Magnesium 2.3; TSH 1.54 04/04/2021: BUN 7; Creatinine, Ser 0.58; Hemoglobin 13.8; Platelets 233; Potassium 4.1; Sodium 141  Recent Lipid Panel Labs (Brief)          Component Value Date/Time    CHOL 165 05/02/2019 0000    CHOL 176 08/03/2015 0827    TRIG 99 05/02/2019 0000    HDL 61 05/02/2019 0000    HDL 63 08/03/2015 0827    CHOLHDL 2.7 05/02/2019 0000    LDLCALC 85 05/02/2019 0000        Physical Exam:     VS:  BP 120/64     Pulse 67    Ht 4\' 11"  (1.499 m)    Wt 139 lb 9.6 oz (63.3 kg)    SpO2 96%    BMI 28.20 kg/m  Wt Readings from Last 3 Encounters:  05/01/21 139 lb 9.6 oz (63.3 kg)  04/24/21 143 lb (64.9 kg)  04/04/21 142 lb (64.4 kg)      GEN:  Well nourished, well developed in no acute distress HEENT: Normal NECK: No JVD; No carotid bruits LYMPHATICS: No lymphadenopathy CARDIAC: RRR, no murmurs, rubs, gallops RESPIRATORY:  Clear to auscultation without rales, wheezing or rhonchi  ABDOMEN: Soft, non-tender, non-distended MUSCULOSKELETAL:  No edema; No deformity  SKIN: Warm and dry NEUROLOGIC:  Alert and oriented x 3 PSYCHIATRIC:  Normal affect          Assessment     ASSESSMENT:     1. Persistent atrial fibrillation ([place])   2. Primary hypertension     PLAN:     In order of problems listed above:   #Persistent atrial fibrillation On Xarelto for stroke prophylaxis.  In normal rhythm today.  We spent a great deal of time during today's appointment discussing the pathophysiology of atrial fibrillation.  We discussed the importance of stroke risk reduction during atrial fibrillation.   #Hypertension Controlled     ------------------------------------------  I have seen, examined the patient, and reviewed the above assessment and plan.    Plan for PVI today.   Vickie Epley, MD 06/13/2021 10:29 AM

## 2021-06-13 NOTE — Anesthesia Preprocedure Evaluation (Addendum)
Anesthesia Evaluation  Patient identified by MRN, date of birth, ID band Patient awake    Reviewed: Allergy & Precautions, H&P , NPO status , Patient's Chart, lab work & pertinent test results, reviewed documented beta blocker date and time   History of Anesthesia Complications Negative for: history of anesthetic complications  Airway Mallampati: II  TM Distance: >3 FB Neck ROM: Full    Dental  (+) Dental Advisory Given, Teeth Intact Permanent bridges:   Pulmonary neg shortness of breath, neg sleep apnea, pneumonia, resolved, neg COPD, Recent URI , Resolved,    Pulmonary exam normal breath sounds clear to auscultation       Cardiovascular Exercise Tolerance: Good hypertension, Pt. on medications and Pt. on home beta blockers (-) angina(-) Past MI and (-) Cardiac Stents + dysrhythmias Atrial Fibrillation + Valvular Problems/Murmurs MR  Rhythm:Irregular Rate:Normal  Echo 03/2021 1. Left ventricular ejection fraction, by estimation, is 50 to 55%. The left ventricle has low normal function. The left ventricle has no regional wall motion abnormalities. Left ventricular diastolic parameters are indeterminate.  2. Right ventricular systolic function is normal. The right ventricular size is normal. There is normal pulmonary artery systolic pressure. The estimated right ventricular systolic pressure is 25.6 mmHg.  3. Left atrial size was mildly dilated.  4. The mitral valve is normal in structure. Mild mitral valve  regurgitation. No evidence of mitral stenosis.  5. Tricuspid valve regurgitation is mild to moderate.  6. The aortic valve is normal in structure. Aortic valve regurgitation is not visualized. No aortic stenosis is present.  7. The inferior vena cava is normal in size with greater than 50% respiratory variability, suggesting right atrial pressure of 3 mmHg.    Neuro/Psych negative neurological ROS  negative psych ROS    GI/Hepatic negative GI ROS, Neg liver ROS,   Endo/Other  negative endocrine ROS  Renal/GU      Musculoskeletal  (+) Arthritis ,   Abdominal   Peds  Hematology negative hematology ROS (+)   Anesthesia Other Findings Past Medical History: 03/20/2016: Acute non-recurrent maxillary sinusitis No date: Allergy No date: Arthritis     Comment:  in thumbs on both hands years: Cataract No date: Hyperlipidemia No date: Hypertension No date: Osteoporosis No date: Pneumonia No date: Prolapse of female pelvic organs  Past Surgical History: No date: ABDOMINAL HYSTERECTOMY No date: CHOLECYSTECTOMY No date: DENTAL SURGERY     Comment:  dental implants No date: DILATION AND CURETTAGE OF UTERUS No date: KNEE SURGERY     Comment:  arthroscopy No date: TONSILLECTOMY  BMI    Body Mass Index: 28.59 kg/m      Reproductive/Obstetrics negative OB ROS                            Anesthesia Physical  Anesthesia Plan  ASA: 3  Anesthesia Plan: General   Post-op Pain Management: Minimal or no pain anticipated   Induction: Intravenous  PONV Risk Score and Plan: 3 and Treatment may vary due to age or medical condition, Ondansetron, Dexamethasone and Diphenhydramine  Airway Management Planned: Oral ETT  Additional Equipment: None  Intra-op Plan:   Post-operative Plan: Extubation in OR  Informed Consent: I have reviewed the patients History and Physical, chart, labs and discussed the procedure including the risks, benefits and alternatives for the proposed anesthesia with the patient or authorized representative who has indicated his/her understanding and acceptance.     Dental advisory given  Plan  Discussed with: CRNA  Anesthesia Plan Comments:        Anesthesia Quick Evaluation

## 2021-06-13 NOTE — Transfer of Care (Signed)
Immediate Anesthesia Transfer of Care Note  Patient: Zoe Alvarado  Procedure(s) Performed: ATRIAL FIBRILLATION ABLATION  Patient Location: Cath Lab  Anesthesia Type:General  Level of Consciousness: awake, alert  and oriented  Airway & Oxygen Therapy: Patient Spontanous Breathing and Patient connected to nasal cannula oxygen  Post-op Assessment: Report given to RN and Post -op Vital signs reviewed and stable  Post vital signs: Reviewed and stable  Last Vitals:  Vitals Value Taken Time  BP 111/60 06/13/21 1500  Temp    Pulse 85 06/13/21 1503  Resp 17 06/13/21 1503  SpO2 96 % 06/13/21 1503  Vitals shown include unvalidated device data.  Last Pain:  Vitals:   06/13/21 2992  TempSrc: Oral         Complications: No notable events documented.

## 2021-06-13 NOTE — Progress Notes (Signed)
Pt arrived to cath lab holding, [place] 4, per CRNA small amount of bruising noted to mid upper chest, defib pad outline also noted to mid chest area, also small amount of bruising noted to left shoulder area, no drainage noted

## 2021-06-13 NOTE — Discharge Instructions (Signed)

## 2021-06-13 NOTE — Anesthesia Postprocedure Evaluation (Signed)
Anesthesia Post Note  Patient: Zoe Alvarado  Procedure(s) Performed: ATRIAL FIBRILLATION ABLATION     Patient location during evaluation: PACU Anesthesia Type: General Level of consciousness: awake and alert Pain management: pain level controlled Vital Signs Assessment: post-procedure vital signs reviewed and stable Respiratory status: spontaneous breathing, nonlabored ventilation, respiratory function stable and patient connected to nasal cannula oxygen Cardiovascular status: blood pressure returned to baseline and stable Postop Assessment: no apparent nausea or vomiting Anesthetic complications: no   No notable events documented.  Last Vitals:  Vitals:   06/13/21 1535 06/13/21 1540  BP: (!) 100/54 (!) 106/54  Pulse: 84 78  Resp: 17 15  Temp:    SpO2: 90% 94%    Last Pain:  Vitals:   06/13/21 1530  TempSrc: Temporal  PainSc: 0-No pain                 Belenda Cruise P Clorene Nerio

## 2021-06-13 NOTE — Progress Notes (Signed)
Pt ambulated without difficulty or bleeding.   Discharged home with her husband who will drive and stay with pt x 24 hrs. 

## 2021-06-14 ENCOUNTER — Encounter (HOSPITAL_COMMUNITY): Payer: Self-pay | Admitting: Cardiology

## 2021-06-14 ENCOUNTER — Telehealth: Payer: Self-pay | Admitting: Cardiology

## 2021-06-14 MED FILL — Heparin Sod (Porcine)-NaCl IV Soln 1000 Unit/500ML-0.9%: INTRAVENOUS | Qty: 500 | Status: AC

## 2021-06-14 NOTE — Telephone Encounter (Signed)
Answered all of the patient's concerns/questions regarding post ablation. Pt temp now 98.7 no s/s of infection. Patient groin stable pt will place new bandaid over groin today.  Post cardioversion burn she will use hydrocortisone cream and cool compresses for relief.  Pt verbalized understanding.  Pt to call if any other concerns arise.

## 2021-06-14 NOTE — Telephone Encounter (Signed)
Called and spoke with pt.   She is s/p a fib ablation that was done yesterday.  Pt reports after she arrived home and laid down she looked at her bandage on right groin and noticed small amount dry blood brown in color on the edge of her bandage. Pt and husband held pressure to right groin per d/c instructions for approx 20 min.   Pt states incision area to right groin does not appear to have swelling and no bleeding at this time.  Bandage is dry per pt.   Pt also reports her temp is 99.0 this morning.   Notified pt I will make a fib clinic aware to call pt to discuss further instructions.

## 2021-06-14 NOTE — Telephone Encounter (Signed)
Patient states she has effects from ablation done yesterday some bleeding applied pressure it has stopped was instructed to call & report. Also states has a blister on her chest, please assist.

## 2021-06-18 ENCOUNTER — Other Ambulatory Visit: Payer: Self-pay

## 2021-06-18 MED ORDER — METOPROLOL TARTRATE 25 MG PO TABS: 25.0000 mg | ORAL_TABLET | Freq: Two times a day (BID) | ORAL | 0 refills | Status: DC

## 2021-06-18 NOTE — Telephone Encounter (Signed)
*  STAT* If patient is at the pharmacy, call can be transferred to refill team.   1. Which medications need to be refilled? (please list name of each medication and dose if known) Metoprolol  2. Which pharmacy/location (including street and city if local pharmacy) is medication to be sent to? [facility] [address]  3. Do they need a 30 day or 90 day supply? 90  

## 2021-06-27 ENCOUNTER — Ambulatory Visit: Payer: Medicare Other | Admitting: Cardiology

## 2021-07-01 ENCOUNTER — Encounter: Payer: Self-pay | Admitting: Internal Medicine

## 2021-07-01 ENCOUNTER — Ambulatory Visit (INDEPENDENT_AMBULATORY_CARE_PROVIDER_SITE_OTHER): Payer: Medicare Other | Admitting: Internal Medicine

## 2021-07-01 ENCOUNTER — Ambulatory Visit: Payer: Self-pay | Admitting: *Deleted

## 2021-07-01 VITALS — HR 76 | Temp 98.3°F | Ht 59.0 in | Wt 141.0 lb

## 2021-07-01 DIAGNOSIS — U071 COVID-19: Secondary | ICD-10-CM

## 2021-07-01 NOTE — Progress Notes (Signed)
Virtual Visit via Telephone Note  I connected with Zoe Alvarado on 07/01/21 at 11:40 AM EST by telephone and verified that I am speaking with the correct person using two identifiers.  Location: Patient: Home Provider: [facility]   I discussed the limitations, risks, security and privacy concerns of performing an evaluation and management service by telephone and the availability of in person appointments. I also discussed with the patient that there may be a patient responsible charge related to this service. The patient expressed understanding and agreed to proceed.   History of Present Illness:  Zoe Alvarado is a 76 year old female presenting over the phone after being COVID positive this morning. Husband was positive last week. Symptoms began this morning. She does have a fib and had an ablation the end of January. She did have a run of A.fib that lasted about 5 hours last night, takes beta blocker and Xarelto. HR 120-140 last night, 78 this morning with normal rhythm.   -Fever:  99.6 , chills  -Cough: yes, dry -Shortness of breath: no -Wheezing: no -Chest pain: no -Chest tightness: no -Chest congestion: no -Nasal congestion: no -Runny nose: yes -Post nasal drip: yes -Sneezing: yes -Sore throat: no -Swollen glands: no -Sinus pressure: yes -Headache: yes -Face pain: no -Ear pain: no  -Ear pressure: no  -Sick contacts: yes -Context: stable -Treatments attempted: cold/sinus and cough syrup    Observations/Objective:  General: no acute distress Neuro: answers all questions appropriately   Assessment and Plan:  1. COVID-19: Positive this morning at home. Discussed anti-virals, unfortunately she is on Xarelto but also cannot swallow pills. She is up to date with COVID vaccines. Will treat symptomatically, Tylenol for fevers, cough suppressant and Flonase. Also discussed A. Fib and recommend calling Cardiologist or going to ER if HR >160 while in A.fib, patient voices understanding.  Continue to monitor symptoms and follow up as needed.   Follow Up Instructions: PRN    I discussed the assessment and treatment plan with the patient. The patient was provided an opportunity to ask questions and all were answered. The patient agreed with the plan and demonstrated an understanding of the instructions.   The patient was advised to call back or seek an in-person evaluation if the symptoms worsen or if the condition fails to improve as anticipated.  I provided 20 minutes of non-face-to-face time during this encounter.   Margarita Mail, DO

## 2021-07-01 NOTE — Telephone Encounter (Signed)
Summary: covid   Pt called in stating she tested positive for covid this morning and wanted to speak with a nurse. Please advise.        Chief Complaint: positve covid at home test 07/01/21 requesting advise / medicaiton Symptoms: sore throat, chills, body aches, sneezing, cough, runny nose episode of A fib.  Frequency: symptoms started Saturday 06/29/21. Pertinent Negatives: Patient denies chest pain , difficulty breathing, fever Disposition: [] ED /[] Urgent Care (no appt availability in office) / [x] Appointment(In office/virtual)/ []  [place] [facility]/ [] Home Care/ [] Refused Recommended Disposition /[] [place] [facility]/ []  Follow-up with PCP Additional Notes:   Patient concerned regarding A fib episodes and swallowing large pills . Requesting to know which medications may interact with antiviral medication. Contacted FC and My Chart VV set up for today .     Reason for Disposition  [1] HIGH RISK for severe COVID complications (e.g., weak immune system, age > 64 years, obesity with BMI > 25, pregnant, chronic lung disease or other chronic medical condition) AND [2] COVID symptoms (e.g., cough, fever)  (Exceptions: Already seen by PCP and no new or worsening symptoms.)  Answer Assessment - Initial Assessment Questions 1. COVID-19 DIAGNOSIS: "Who made your COVID-19 diagnosis?" "Was it confirmed by a positive lab test or self-test?" If not diagnosed by a doctor (or NP/PA), ask "Are there lots of cases (community spread) where you live?" Note: See public health department website, if unsure.     Covid positive at home test  2. COVID-19 EXPOSURE: "Was there any known exposure to COVID before the symptoms began?" CDC Definition of close contact: within 6 feet (2 meters) for a total of 15 minutes or more over a 24-hour period.      Yes husband  3. ONSET: "When did the COVID-19 symptoms start?"      06/29/21 4. WORST SYMPTOM: "What is your worst symptom?" (e.g., cough, fever, shortness  of breath, muscle aches)     Chills, body aches, sore throat, sneezing, runny nose unable to sleep , hx a fib  5. COUGH: "Do you have a cough?" If Yes, ask: "How bad is the cough?"       Yes  6. FEVER: "Do you have a fever?" If Yes, ask: "What is your temperature, how was it measured, and when did it start?"     Na  7. RESPIRATORY STATUS: "Describe your breathing?" (e.g., shortness of breath, wheezing, unable to speak)      Denies  8. BETTER-SAME-WORSE: "Are you getting better, staying the same or getting worse compared to yesterday?"  If getting worse, ask, "In what way?"     na 9. HIGH RISK DISEASE: "Do you have any chronic medical problems?" (e.g., asthma, heart or lung disease, weak immune system, obesity, etc.)     Hx A- fib  10. VACCINE: "Have you had the COVID-19 vaccine?" If Yes, ask: "Which one, how many shots, when did you get it?"       Yes x 2 Pfizer  11. BOOSTER: "Have you received your COVID-19 booster?" If Yes, ask: "Which one and when did you get it?"        12. PREGNANCY: "Is there any chance you are pregnant?" "When was your last menstrual period?"       na 13. OTHER SYMPTOMS: "Do you have any other symptoms?"  (e.g., chills, fatigue, headache, loss of smell or taste, muscle pain, sore throat)       Chills, body aches, sore throat ,  runny nose, sneezing. 14. O2 SATURATION MONITOR:  "Do you use an oxygen saturation monitor (pulse oximeter) at home?" If Yes, ask "What is your reading (oxygen level) today?" "What is your usual oxygen saturation reading?" (e.g., 95%)       na  Protocols used: Coronavirus (COVID-19) Diagnosed or Suspected-A-AH

## 2021-07-01 NOTE — Telephone Encounter (Signed)
Already seen.

## 2021-07-05 ENCOUNTER — Telehealth: Payer: Medicare Other | Admitting: Cardiology

## 2021-07-11 ENCOUNTER — Encounter (HOSPITAL_COMMUNITY): Payer: Self-pay | Admitting: Physician Assistant

## 2021-07-11 ENCOUNTER — Ambulatory Visit (HOSPITAL_COMMUNITY)
Admission: RE | Admit: 2021-07-11 | Discharge: 2021-07-11 | Disposition: A | Discharge status: Home / Self Care | Payer: Medicare Other | Source: Ambulatory Visit | Attending: Physician Assistant | Admitting: Physician Assistant

## 2021-07-11 ENCOUNTER — Other Ambulatory Visit: Payer: Self-pay

## 2021-07-11 VITALS — BP 136/80 | HR 74 | Ht 59.0 in | Wt 135.8 lb

## 2021-07-11 DIAGNOSIS — I10 Essential (primary) hypertension: Secondary | ICD-10-CM | POA: Diagnosis not present

## 2021-07-11 DIAGNOSIS — D6869 Other thrombophilia: Secondary | ICD-10-CM | POA: Insufficient documentation

## 2021-07-11 DIAGNOSIS — I483 Typical atrial flutter: Secondary | ICD-10-CM | POA: Diagnosis not present

## 2021-07-11 DIAGNOSIS — E785 Hyperlipidemia, unspecified: Secondary | ICD-10-CM | POA: Diagnosis not present

## 2021-07-11 DIAGNOSIS — Z7901 Long term (current) use of anticoagulants: Secondary | ICD-10-CM | POA: Diagnosis not present

## 2021-07-11 DIAGNOSIS — I4892 Unspecified atrial flutter: Secondary | ICD-10-CM | POA: Diagnosis not present

## 2021-07-11 DIAGNOSIS — I4819 Other persistent atrial fibrillation: Secondary | ICD-10-CM | POA: Diagnosis present

## 2021-07-11 DIAGNOSIS — Z79899 Other long term (current) drug therapy: Secondary | ICD-10-CM | POA: Diagnosis not present

## 2021-07-11 NOTE — Progress Notes (Signed)
Primary Care Physician: [name], PA-C Primary Cardiologist: Dr Azucena Cecil Primary Electrophysiologist: Dr Lalla Brothers Referring Physician: Dr Lalla Brothers   Zoe Alvarado is a 76 y.o. female with a history of HTN, HLD, atrial fibrillation who presents for follow up in the [facility]. Patient is on Xarelto for a CHADS2VASC score of 3. She underwent DCCV for persistent afib on 04/24/21 and then had afib and flutter ablation on 06/13/21 with Dr Lalla Brothers. Patient reports that she has done well since the procedure. She has noted 3 episodes of afib, the last was 2/14 which was also the shortest. She denies CP, swallowing pain, or groin issues.   Today, she denies symptoms of palpitations, chest pain, shortness of breath, orthopnea, PND, lower extremity edema, dizziness, presyncope, syncope, snoring, daytime somnolence, bleeding, or neurologic sequela. The patient is tolerating medications without difficulties and is otherwise without complaint today.    Atrial Fibrillation Risk Factors:  she does not have symptoms or diagnosis of sleep apnea. she does not have a history of rheumatic fever. she does not have a history of alcohol use.   she has a BMI of Body mass index is 27.43 kg/m.Marland Kitchen Filed Weights   07/11/21 1033  Weight: 61.6 kg    Family History  Problem Relation Age of Onset   Diabetes Mother    Heart attack Father    Heart disease Father    Hypertension Father    Prostate cancer Father    Cancer Father    Vision loss Father    Diabetes Brother    Breast cancer Maternal Aunt 40   Cancer Maternal Aunt    Breast cancer Maternal Grandmother    Cancer Maternal Grandmother    Ovarian cancer Neg Hx    Colon cancer Neg Hx      Atrial Fibrillation Management history:  Previous antiarrhythmic drugs: none Previous cardioversions: 04/24/21 Previous ablations: 06/13/21 fib and flutter CHADS2VASC score: 3 Anticoagulation history: Xarelto    Past Medical  History:  Diagnosis Date   Acute non-recurrent maxillary sinusitis 03/20/2016   Allergy    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   Cataract years   Hyperlipidemia    Hypertension    Osteoporosis    Pneumonia    Prolapse of female pelvic organs    Past Surgical History:  Procedure Laterality Date   ABDOMINAL HYSTERECTOMY     ATRIAL FIBRILLATION ABLATION N/A 06/13/2021   Procedure: ATRIAL FIBRILLATION ABLATION;  Surgeon: Lanier Prude, MD;  [facility];  Service: Cardiovascular;  Laterality: N/A;   CARDIOVERSION N/A 04/24/2021   Procedure: CARDIOVERSION;  Surgeon: Debbe Odea, MD;  Location: [facility];  Service: Cardiovascular;  Laterality: N/A;   CATARACT EXTRACTION W/PHACO Left 11/24/2019   Procedure: CATARACT EXTRACTION PHACO AND INTRAOCULAR LENS PLACEMENT (IOC) LEFT [name];  Surgeon: Lockie Mola, MD;  Location: [facility];  Service: Ophthalmology;  Laterality: Left;  cde 16.31 [place] 1:39 ap 16.5    CATARACT EXTRACTION W/PHACO Right 12/14/2019   Procedure: CATARACT EXTRACTION PHACO AND INTRAOCULAR LENS PLACEMENT (IOC) RIGHT EYHANCE TORIC 3.77 00:41.5 901%;  Surgeon: Lockie Mola, MD;  Location: [facility];  Service: Ophthalmology;  Laterality: Right;   CHOLECYSTECTOMY     DENTAL SURGERY     dental implants   DILATION AND CURETTAGE OF UTERUS     KNEE SURGERY     arthroscopy   TONSILLECTOMY      Current Outpatient Medications  Medication Sig Dispense Refill  alendronate (FOSAMAX) 70 MG tablet Take 70 mg by mouth every Tuesday. Take with a full glass of water on an empty stomach.     Calcium-Vitamin D-Vitamin K (VIACTIV CALCIUM PLUS D) 650-12.5-40 MG-MCG-MCG CHEW Chew 2 each by mouth daily.     DM-Doxylamine-Acetaminophen (NYQUIL HBP COLD & FLU PO) Take 1 Dose by mouth daily as needed (cough).     estradiol (ESTRACE) 0.5 MG tablet Take 1 tablet (0.5 mg total) by mouth daily. 90 tablet 3   metoprolol tartrate (LOPRESSOR) 25 MG  tablet Take 1 tablet (25 mg total) by mouth 2 (two) times daily. 180 tablet 0   Multiple Vitamins-Minerals (CENTRUM MINIS WOMEN 50+) TABS Take 1 tablet by mouth 2 (two) times daily.     pantoprazole (PROTONIX) 40 MG tablet Take 1 tablet (40 mg total) by mouth daily. 45 tablet 0   Propylene Glycol (SYSTANE COMPLETE) 0.6 % SOLN Place 1 drop into both eyes in the morning and at bedtime.     rivaroxaban (XARELTO) 20 MG TABS tablet Take 1 tablet (20 mg total) by mouth daily with supper. 30 tablet 5   sodium chloride (OCEAN) 0.65 % SOLN nasal spray Place 1 spray into both nostrils daily as needed for congestion.     No current facility-administered medications for this encounter.    Allergies  Allergen Reactions   Codeine Rash    On face and shoulders   Penicillins Rash    On face and should / in [place] History   Marital status: Married    Spouse name: Not on file   Number of children: 3   Years of education: Not on file   Highest education level: Not on file  Occupational History   Not on file  Tobacco Use   Smoking status: Never   Smokeless tobacco: Never  Vaping Use   Vaping Use: Never used  Substance and Sexual Activity   Alcohol use: No    Alcohol/week: 0.0 standard drinks   Drug use: No   Sexual activity: Yes    Partners: Male    Birth control/protection: Surgical  Other Topics Concern   Not on file  Social History Narrative   2nd marriage   3 living sons, 2 deceased   Social Determinants of Health   Financial Resource Strain: Not on file  Food Insecurity: Not on file  Transportation Needs: Not on file  Physical Activity: Not on file  Stress: Not on file  Social Connections: Not on file  Intimate Partner Violence: Not on file     ROS- All systems are reviewed and negative except as per the HPI above.  Physical Exam: Vitals:   07/11/21 1033  BP: 136/80  Pulse: 74  Weight: 61.6 kg  Height: 4\' 11"  (1.499 m)    GEN- The  patient is a well appearing female, alert and oriented x 3 today.   Head- normocephalic, atraumatic Eyes-  Sclera clear, conjunctiva pink Ears- hearing intact Oropharynx- clear Neck- supple  Lungs- Clear to ausculation bilaterally, normal work of breathing Heart- Regular rate and rhythm, no murmurs, rubs or gallops  GI- soft, NT, ND, + BS Extremities- no clubbing, cyanosis, or edema MS- no significant deformity or atrophy Skin- no rash or lesion Psych- euthymic mood, full affect Neuro- strength and sensation are intact  Wt Readings from Last 3 Encounters:  07/11/21 61.6 kg  07/01/21 64 kg  06/13/21 64 kg    EKG today demonstrates  SR Vent. rate 74 BPM PR interval 194 ms QRS duration 76 ms QT/QTcB 362/401 ms  Echo 03/26/21 demonstrated  1. Left ventricular ejection fraction, by estimation, is 50 to 55%. The  left ventricle has low normal function. The left ventricle has no regional  wall motion abnormalities. Left ventricular diastolic parameters are  indeterminate.   2. Right ventricular systolic function is normal. The right ventricular  size is normal. There is normal pulmonary artery systolic pressure. The  estimated right ventricular systolic pressure is 0000000 mmHg.   3. Left atrial size was mildly dilated.   4. The mitral valve is normal in structure. Mild mitral valve  regurgitation. No evidence of mitral stenosis.   5. Tricuspid valve regurgitation is mild to moderate.   6. The aortic valve is normal in structure. Aortic valve regurgitation is  not visualized. No aortic stenosis is present.   7. The inferior vena cava is normal in size with greater than 50%  respiratory variability, suggesting right atrial pressure of 3 mmHg.   Epic records are reviewed at length today  CHA2DS2-VASc Score = 3  The patient's score is based upon: CHF History: 0 HTN History: 1 Diabetes History: 0 Stroke History: 0 Vascular Disease History: 0 Age Score: 1 Gender Score: 1        ASSESSMENT AND PLAN: 1. Persistent Atrial Fibrillation/atrial flutter The patient's CHA2DS2-VASc score is 3, indicating a 3.2% annual risk of stroke.   S/p afib and flutter ablation 06/13/21 Reassured patient that some breakthrough episodes of afib are not uncommon soon after an ablation.  Continue Lopressor 25 mg BID Continue Xarelto 20 mg with no missed doses for 3 months post ablation.  Kardia for home monitoring.   2. Secondary Hypercoagulable State (ICD10:  D68.69) The patient is at significant risk for stroke/thromboembolism based upon her CHA2DS2-VASc Score of 3.  Continue Rivaroxaban (Xarelto).   3. HTN Stable, no changes today.   Follow up with Dr Garen Lah and Dr Quentin Ore as scheduled.    [facility] [address] [place], [place] [address] [phone] 07/11/2021 10:42 AM

## 2021-07-12 ENCOUNTER — Other Ambulatory Visit: Payer: Self-pay

## 2021-07-12 ENCOUNTER — Encounter: Payer: Self-pay | Admitting: Cardiology

## 2021-07-12 ENCOUNTER — Ambulatory Visit: Payer: Medicare Other | Admitting: Cardiology

## 2021-07-12 VITALS — BP 120/70 | HR 83 | Ht 59.0 in | Wt 137.0 lb

## 2021-07-12 DIAGNOSIS — I4819 Other persistent atrial fibrillation: Secondary | ICD-10-CM | POA: Diagnosis not present

## 2021-07-12 DIAGNOSIS — I700 Atherosclerosis of aorta: Secondary | ICD-10-CM | POA: Diagnosis not present

## 2021-07-12 DIAGNOSIS — I10 Essential (primary) hypertension: Secondary | ICD-10-CM | POA: Diagnosis not present

## 2021-07-12 NOTE — Progress Notes (Signed)
Cardiology Office Note:    Date:  07/12/2021   ID:  KANARI AROCHE, DOB 1946/04/10, MRN [id number]  PCP:  Delsa Grana, PA-C   [facility] Providers Cardiologist:  Kate Sable, MD Electrophysiologist:  Vickie Epley, MD     Referring MD: Delsa Grana, PA-C   Chief Complaint  Patient presents with   Other    2 month follow up -- Meds reviewed verbally with patient.     History of Present Illness:    Zoe Alvarado is a 76 y.o. female with a hx of persistent atrial fibrillation s/p RFA 05/2021, hypertension who presents for follow-up.    Previously seen for persistent atrial fibrillation, underwent RFA 05/2021 successfully.  Had occasional episodes of A-fib after ablation, but has been maintaining sinus rhythm over the past month.  She has a cardiac device and checks her heart rates frequently.  Tolerating current doses of Lopressor and Xarelto, no significant bleeding issues.  CT scan for A-fib ablation showed aortic atherosclerosis.  Prior notes DC cardioversion 04/2021 Echocardiogram 03/2021 did show preserved ejection fraction, EF 50 to 55%.  Mildly dilated LA.   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   Cataract years   Hyperlipidemia    Hypertension    Osteoporosis    Pneumonia    Prolapse of female pelvic organs     Past Surgical History:  Procedure Laterality Date   ABDOMINAL HYSTERECTOMY     ATRIAL FIBRILLATION ABLATION N/A 06/13/2021   Procedure: ATRIAL FIBRILLATION ABLATION;  Surgeon: Vickie Epley, MD;  Location: [place] [facility];  Service: Cardiovascular;  Laterality: N/A;   CARDIOVERSION N/A 04/24/2021   Procedure: CARDIOVERSION;  Surgeon: Kate Sable, MD;  Location: [facility];  Service: Cardiovascular;  Laterality: N/A;   CATARACT EXTRACTION W/PHACO Left 11/24/2019   Procedure: CATARACT EXTRACTION PHACO AND INTRAOCULAR LENS PLACEMENT  ([place]) LEFT Tyler Deis;  Surgeon: Leandrew Koyanagi, MD;  Location: [facility];  Service: Ophthalmology;  Laterality: Left;  cde 16.31 [place] 1:39 ap 16.5    CATARACT EXTRACTION W/PHACO Right 12/14/2019   Procedure: CATARACT EXTRACTION PHACO AND INTRAOCULAR LENS PLACEMENT (IOC) RIGHT EYHANCE TORIC 3.77 00:41.5 901%;  Surgeon: Leandrew Koyanagi, MD;  Location: [place];  Service: Ophthalmology;  Laterality: Right;   CHOLECYSTECTOMY     DENTAL SURGERY     dental implants   DILATION AND CURETTAGE OF UTERUS     KNEE SURGERY     arthroscopy   TONSILLECTOMY      Current Medications: Current Meds  Medication Sig   alendronate (FOSAMAX) 70 MG tablet Take 70 mg by mouth every Tuesday. Take with a full glass of water on an empty stomach.   Calcium-Vitamin D-Vitamin K (VIACTIV CALCIUM PLUS D) 650-12.5-40 MG-MCG-MCG CHEW Chew 2 each by mouth daily.   DM-Doxylamine-Acetaminophen (NYQUIL HBP COLD & FLU PO) Take 1 Dose by mouth daily as needed (cough).   estradiol (ESTRACE) 0.5 MG tablet Take 1 tablet (0.5 mg total) by mouth daily.   metoprolol tartrate (LOPRESSOR) 25 MG tablet Take 1 tablet (25 mg total) by mouth 2 (two) times daily.   Multiple Vitamins-Minerals (CENTRUM MINIS WOMEN 50+) TABS Take 1 tablet by mouth 2 (two) times daily.   pantoprazole (PROTONIX) 40 MG tablet Take 1 tablet (40 mg total) by mouth daily.   Propylene Glycol (SYSTANE COMPLETE) 0.6 % SOLN Place 1 drop into  both eyes in the morning and at bedtime.   rivaroxaban (XARELTO) 20 MG TABS tablet Take 1 tablet (20 mg total) by mouth daily with supper.   sodium chloride (OCEAN) 0.65 % SOLN nasal spray Place 1 spray into both nostrils daily as needed for congestion.     Allergies:   Codeine and Penicillins   Social History   Socioeconomic History   Marital status: Married    Spouse name: Not on file   Number of children: 3   Years of education: Not on file   Highest education level: Not on file  Occupational  History   Not on file  Tobacco Use   Smoking status: Never   Smokeless tobacco: Never  Vaping Use   Vaping Use: Never used  Substance and Sexual Activity   Alcohol use: No    Alcohol/week: 0.0 standard drinks   Drug use: No   Sexual activity: Yes    Partners: Male    Birth control/protection: Surgical  Other Topics Concern   Not on file  Social History Narrative   2nd marriage   3 living sons, 2 deceased   Social Determinants of Health   Financial Resource Strain: Not on file  Food Insecurity: Not on file  Transportation Needs: Not on file  Physical Activity: Not on file  Stress: Not on file  Social Connections: Not on file     Family History: The patient's family history includes Breast cancer in her maternal grandmother; Breast cancer (age of onset: 35) in her maternal aunt; Cancer in her father, maternal aunt, and maternal grandmother; Diabetes in her brother and mother; Heart attack in her father; Heart disease in her father; Hypertension in her father; Prostate cancer in her father; Vision loss in her father. There is no history of Ovarian cancer or Colon cancer.  ROS:   Please see the history of present illness.     All other systems reviewed and are negative.  EKGs/Labs/Other Studies Reviewed:    The following studies were reviewed today:   EKG:  EKG not ordered today.  EKG obtained 07/11/2021 showed normal sinus rhythm.  Recent Labs: 03/13/2021: Magnesium 2.3; TSH 1.54 05/22/2021: BUN 11; Creatinine, Ser 0.57; Hemoglobin 13.8; Platelets 204; Potassium 4.3; Sodium 139  Recent Lipid Panel    Component Value Date/Time   CHOL 165 05/02/2019 0000   CHOL 176 08/03/2015 0827   TRIG 99 05/02/2019 0000   HDL 61 05/02/2019 0000   HDL 63 08/03/2015 0827   CHOLHDL 2.7 05/02/2019 0000   LDLCALC 85 05/02/2019 0000     Risk Assessment/Calculations:          Physical Exam:    VS:  BP 120/70 (BP Location: Left Arm, Patient Position: Sitting, Cuff Size: Normal)     Pulse 83    Ht 4\' 11"  (1.499 m)    Wt 137 lb (62.1 kg)    SpO2 98%    BMI 27.67 kg/m     Wt Readings from Last 3 Encounters:  07/12/21 137 lb (62.1 kg)  07/11/21 135 lb 12.8 oz (61.6 kg)  07/01/21 141 lb (64 kg)     GEN:  Well nourished, well developed in no acute distress HEENT: Normal NECK: No JVD; No carotid bruits LYMPHATICS: No lymphadenopathy CARDIAC: Regular rate and rhythm, no murmurs RESPIRATORY:  Clear to auscultation without rales, wheezing or rhonchi  ABDOMEN: Soft, non-tender, non-distended MUSCULOSKELETAL:  No edema; No deformity  SKIN: Warm and dry NEUROLOGIC:  Alert and oriented x 3  PSYCHIATRIC:  Normal affect   ASSESSMENT:    1. Persistent atrial fibrillation ([place])   2. Primary hypertension   3. Aortic atherosclerosis (HCC)      PLAN:    In order of problems listed above:  Persistent atrial fibrillation, s/p RFA 05/2021, CHA2DS2-VASc of  3(age, gender, htn).  Maintaining sinus rhythm.  Echo shows normal EF 50 to 55%, mild LA dilatation.   Continue Lopressor 25 mg twice daily, continue Xarelto 20 mg daily.   Hypertension, BP controlled.  Continue Lopressor 25 twice daily. Aortic atherosclerosis, obtain fasting lipid profile.  Consider statin pending cholesterol numbers.  Follow-up in 1 year.     Medication Adjustments/Labs and Tests Ordered: Current medicines are reviewed at length with the patient today.  Concerns regarding medicines are outlined above.  Orders Placed This Encounter  Procedures   Lipid panel    No orders of the defined types were placed in this encounter.    Patient Instructions  Medication Instructions:  Your physician recommends that you continue on your current medications as directed. Please refer to the Current Medication list given to you today.  *If you need a refill on your cardiac medications before your next appointment, please call your pharmacy*   Lab Work:   Your physician recommends that you return for a  FASTING lipid profile: At your earliest convenience.  - You will need to be fasting. Please do not have anything to eat or drink after midnight the morning you have the lab work. You may only have water or black coffee with no cream or sugar.   Please return to our office on _____________________at_____________am/pm    Testing/Procedures: None ordered   Follow-Up: At [facility], you and your health needs are our priority.  As part of our continuing mission to provide you with exceptional heart care, we have created designated Provider Care Teams.  These Care Teams include your primary Cardiologist (physician) and Advanced Practice Providers (APPs -  Physician Assistants and Nurse Practitioners) who all work together to provide you with the care you need, when you need it.  We recommend signing up for the patient portal called "MyChart".  Sign up information is provided on this After Visit Summary.  MyChart is used to connect with patients for Virtual Visits (Telemedicine).  Patients are able to view lab/test results, encounter notes, upcoming appointments, etc.  Non-urgent messages can be sent to your provider as well.   To learn more about what you can do with MyChart, go to [email or web address].    Your next appointment:   1 year(s)  The format for your next appointment:   In Person  Provider:   You may see Kate Sable, MD or one of the following Advanced Practice Providers on your designated Care Team:   Murray Hodgkins, NP Christell Faith, PA-C Cadence Kathlen Mody, [place]    Other Instructions     Signed, Kate Sable, MD  07/12/2021 12:21 PM    [place]

## 2021-07-12 NOTE — Patient Instructions (Signed)
Medication Instructions:  Your physician recommends that you continue on your current medications as directed. Please refer to the Current Medication list given to you today.  *If you need a refill on your cardiac medications before your next appointment, please call your pharmacy*   Lab Work:   Your physician recommends that you return for a FASTING lipid profile: At your earliest convenience.  - You will need to be fasting. Please do not have anything to eat or drink after midnight the morning you have the lab work. You may only have water or black coffee with no cream or sugar.   Please return to our office on _____________________at_____________am/pm    Testing/Procedures: None ordered   Follow-Up: At [facility], you and your health needs are our priority.  As part of our continuing mission to provide you with exceptional heart care, we have created designated Provider Care Teams.  These Care Teams include your primary Cardiologist (physician) and Advanced Practice Providers (APPs -  Physician Assistants and Nurse Practitioners) who all work together to provide you with the care you need, when you need it.  We recommend signing up for the patient portal called "MyChart".  Sign up information is provided on this After Visit Summary.  MyChart is used to connect with patients for Virtual Visits (Telemedicine).  Patients are able to view lab/test results, encounter notes, upcoming appointments, etc.  Non-urgent messages can be sent to your provider as well.   To learn more about what you can do with MyChart, go to [email or web address].    Your next appointment:   1 year(s)  The format for your next appointment:   In Person  Provider:   You may see Debbe Odea, MD or one of the following Advanced Practice Providers on your designated Care Team:   Nicolasa Ducking, NP Eula Listen, PA-C Cadence Fransico Michael, [place]    Other Instructions

## 2021-07-26 ENCOUNTER — Other Ambulatory Visit: Payer: Self-pay

## 2021-07-26 ENCOUNTER — Other Ambulatory Visit (INDEPENDENT_AMBULATORY_CARE_PROVIDER_SITE_OTHER): Payer: Medicare Other

## 2021-07-26 DIAGNOSIS — I700 Atherosclerosis of aorta: Secondary | ICD-10-CM

## 2021-07-27 LAB — LIPID PANEL
Chol/HDL Ratio: 2.6 ratio (ref 0.0–4.4)
Cholesterol, Total: 175 mg/dL (ref 100–199)
HDL: 68 mg/dL (ref >39)
LDL Chol Calc (NIH): 93 mg/dL (ref 0–99)
Triglycerides: 74 mg/dL (ref 0–149)
VLDL Cholesterol Cal: 14 mg/dL (ref 5–40)

## 2021-07-30 ENCOUNTER — Other Ambulatory Visit: Payer: Self-pay | Admitting: Obstetrics and Gynecology

## 2021-08-26 ENCOUNTER — Other Ambulatory Visit: Payer: Self-pay

## 2021-08-26 DIAGNOSIS — Z1231 Encounter for screening mammogram for malignant neoplasm of breast: Secondary | ICD-10-CM

## 2021-09-11 ENCOUNTER — Ambulatory Visit: Payer: Medicare Other | Admitting: Family Medicine

## 2021-09-11 DIAGNOSIS — I10 Essential (primary) hypertension: Secondary | ICD-10-CM

## 2021-09-11 DIAGNOSIS — E785 Hyperlipidemia, unspecified: Secondary | ICD-10-CM

## 2021-09-15 NOTE — Progress Notes (Signed)
?Electrophysiology Office Follow up Visit Note:   ? ?Date:  09/18/2021  ? ?ID:  Zoe Alvarado, DOB 1945-11-25, MRN [id number] ? ?PCP:  Delsa Grana, PA-C  ?[facility] Cardiologist:  Kate Sable, MD  ?[facility] Electrophysiologist:  Vickie Epley, MD  ? ? ?Interval History:   ? ?Zoe Alvarado is a 76 y.o. female who presents for a follow up visit. She underwent an Afib ablation on 06/13/2021. During the ablation, the veins, posterior wall and CTI were ablated. She has done well since ablation without a sustained episode of AF. She has been on xarelto for stroke ppx. ? ?She has done well after the ablation without recurrence.  She continues to take Xarelto for stroke prophylaxis.  She is on metoprolol tartrate twice daily.   ? ? ?  ? ?Past Medical History:  ?Diagnosis Date  ? Acute non-recurrent maxillary sinusitis 03/20/2016  ? Allergy   ? Arthritis   ? in thumbs on both hands  ? Cataract years  ? Hyperlipidemia   ? Hypertension   ? Osteoporosis   ? Pneumonia   ? Prolapse of female pelvic organs   ? ? ?Past Surgical History:  ?Procedure Laterality Date  ? ABDOMINAL HYSTERECTOMY    ? ATRIAL FIBRILLATION ABLATION N/A 06/13/2021  ? Procedure: ATRIAL FIBRILLATION ABLATION;  Surgeon: Vickie Epley, MD;  Location: [facility];  Service: Cardiovascular;  Laterality: N/A;  ? CARDIOVERSION N/A 04/24/2021  ? Procedure: CARDIOVERSION;  Surgeon: Kate Sable, MD;  Location: [facility];  Service: Cardiovascular;  Laterality: N/A;  ? CATARACT EXTRACTION W/PHACO Left 11/24/2019  ? Procedure: CATARACT EXTRACTION PHACO AND INTRAOCULAR LENS PLACEMENT ([place]) LEFT Zoe Alvarado;  Surgeon: Leandrew Koyanagi, MD;  Location: [facility];  Service: Ophthalmology;  Laterality: Left;  cde 16.31 ?[place] 1:39 ?ap 16.5 ?  ? CATARACT EXTRACTION W/PHACO Right 12/14/2019  ? Procedure: CATARACT EXTRACTION PHACO AND INTRAOCULAR LENS PLACEMENT ([facility]) RIGHT EYHANCE TORIC 3.77 00:41.5 901%;  Surgeon: Leandrew Koyanagi, MD;  Location:  [place];  Service: Ophthalmology;  Laterality: Right;  ? CHOLECYSTECTOMY    ? DENTAL SURGERY    ? dental implants  ? DILATION AND CURETTAGE OF UTERUS    ? KNEE SURGERY    ? arthroscopy  ? TONSILLECTOMY    ? ? ?Current Medications: ?Current Meds  ?Medication Sig  ? alendronate (FOSAMAX) 70 MG tablet Take 70 mg by mouth every Tuesday. Take with a full glass of water on an empty stomach.  ? Calcium-Vitamin D-Vitamin K (VIACTIV CALCIUM PLUS D) 650-12.5-40 MG-MCG-MCG CHEW Chew 2 each by mouth daily.  ? DM-Doxylamine-Acetaminophen (NYQUIL HBP COLD & FLU PO) Take 1 Dose by mouth daily as needed (cough).  ? estradiol (ESTRACE) 0.5 MG tablet Take 1 tablet by mouth once daily  ? metoprolol succinate (TOPROL XL) 25 MG 24 hr tablet Take 1 tablet (25 mg total) by mouth at bedtime.  ? Multiple Vitamins-Minerals (CENTRUM MINIS WOMEN 50+) TABS Take 1 tablet by mouth 2 (two) times daily.  ? Propylene Glycol (SYSTANE COMPLETE) 0.6 % SOLN Place 1 drop into both eyes in the morning and at bedtime.  ? rivaroxaban (XARELTO) 20 MG TABS tablet Take 1 tablet (20 mg total) by mouth daily with supper.  ? sodium chloride (OCEAN) 0.65 % SOLN nasal spray Place 1 spray into both nostrils daily as needed for congestion.  ?  ? ?Allergies:   Codeine and Penicillins  ? ?Social History  ? ?Socioeconomic History  ? Marital status: Married  ?  Spouse name: Not on file  ? Number of children: 3  ? Years of education: Not on file  ? Highest education level: Not on file  ?Occupational History  ? Not on file  ?Tobacco Use  ? Smoking status: Never  ? Smokeless tobacco: Never  ?Vaping Use  ? Vaping Use: Never used  ?Substance and Sexual Activity  ? Alcohol use: No  ?  Alcohol/week: 0.0 standard drinks  ? Drug use: No  ? Sexual activity: Yes  ?  Partners: Male  ?  Birth control/protection: Surgical  ?Other Topics Concern  ? Not on file  ?Social History Narrative  ? 2nd marriage  ? 3 living sons, 2 deceased  ? ?Social Determinants of Health   ? ?Financial Resource Strain: Not on file  ?Food Insecurity: Not on file  ?Transportation Needs: Not on file  ?Physical Activity: Not on file  ?Stress: Not on file  ?Social Connections: Not on file  ?  ? ?Family History: ?The patient's family history includes Breast cancer in her maternal grandmother; Breast cancer (age of onset: 74) in her maternal aunt; Cancer in her father, maternal aunt, and maternal grandmother; Diabetes in her brother and mother; Heart attack in her father; Heart disease in her father; Hypertension in her father; Prostate cancer in her father; Vision loss in her father. There is no history of Ovarian cancer or Colon cancer. ? ?ROS:   ?Please see the history of present illness.    ?All other systems reviewed and are negative. ? ?EKGs/Labs/Other Studies Reviewed:   ? ?The following studies were reviewed today: ? ? ?EKG:  The ekg ordered today demonstrates sinus rhythm with PACs ? ?Recent Labs: ?03/13/2021: Magnesium 2.3; TSH 1.54 ?05/22/2021: BUN 11; Creatinine, Ser 0.57; Hemoglobin 13.8; Platelets 204; Potassium 4.3; Sodium 139  ?Recent Lipid Panel ?   ?Component Value Date/Time  ? CHOL 175 07/26/2021 0809  ? TRIG 74 07/26/2021 0809  ? HDL 68 07/26/2021 0809  ? CHOLHDL 2.6 07/26/2021 0809  ? CHOLHDL 2.7 05/02/2019 0000  ? [place] 93 07/26/2021 0809  ? [place] 85 05/02/2019 0000  ? ? ?Physical Exam:   ? ?VS:  BP 120/78   Pulse 84   Ht 4\' 11"  (1.499 m)   Wt 136 lb (61.7 kg)   SpO2 95%   BMI 27.47 kg/m?    ? ?Wt Readings from Last 3 Encounters:  ?09/18/21 136 lb (61.7 kg)  ?07/12/21 137 lb (62.1 kg)  ?07/11/21 135 lb 12.8 oz (61.6 kg)  ?  ? ?GEN:  Well nourished, well developed in no acute distress ?HEENT: Normal ?NECK: No JVD; No carotid bruits ?LYMPHATICS: No lymphadenopathy ?CARDIAC: RRR, no murmurs, rubs, gallops ?RESPIRATORY:  Clear to auscultation without rales, wheezing or rhonchi  ?ABDOMEN: Soft, non-tender, non-distended ?MUSCULOSKELETAL:  No edema; No deformity  ?SKIN: Warm and  dry ?NEUROLOGIC:  Alert and oriented x 3 ?PSYCHIATRIC:  Normal affect  ? ? ? ?  ? ?ASSESSMENT:   ? ?1. Persistent atrial fibrillation ([place])   ?2. Primary hypertension   ?3. Typical atrial flutter ([place])   ? ?PLAN:   ? ?In order of problems listed above: ? ?#Persistent atrial fibrillation ?#Typical atrial flutter ?Maintaining sinus rhythm after her ablation.  No recurrence.  Continues to take Xarelto for stroke prophylaxis.  She will continue her Xarelto for at least an additional 9 months to complete 1 year post ablation.  We will plan to discuss anticoagulation again at the 1 year mark after her ablation.  Could  consider loop recorder monitoring if the patient would like to stop blood thinners and if no recurrence in the interim. ? ?I will also simplify her medication regimen today by transitioning to metoprolol succinate 50 mg by mouth once daily in the evening.  She will stop her metoprolol tartrate after she runs out of her current supply. ? ? ?#Aortic atherosclerosis ?Follows with Dr. Garen Lah. ? ? ?Follow-up 9 months. ? ? ? ? ? ?Medication Adjustments/Labs and Tests Ordered: ?Current medicines are reviewed at length with the patient today.  Concerns regarding medicines are outlined above.  ?Orders Placed This Encounter  ?Procedures  ? EKG 12-Lead  ? ?Meds ordered this encounter  ?Medications  ? metoprolol succinate (TOPROL XL) 25 MG 24 hr tablet  ?  Sig: Take 1 tablet (25 mg total) by mouth at bedtime.  ?  Dispense:  90 tablet  ?  Refill:  3  ? ? ? ?Signed, ?Lars Mage, MD, [place] ?09/18/2021 11:45 AM    ?Electrophysiology ?[place] ?

## 2021-09-18 ENCOUNTER — Ambulatory Visit: Payer: Medicare Other | Admitting: Cardiology

## 2021-09-18 ENCOUNTER — Other Ambulatory Visit: Payer: Self-pay | Admitting: *Deleted

## 2021-09-18 ENCOUNTER — Encounter: Payer: Self-pay | Admitting: Cardiology

## 2021-09-18 VITALS — BP 120/78 | HR 84 | Ht 59.0 in | Wt 136.0 lb

## 2021-09-18 DIAGNOSIS — I4819 Other persistent atrial fibrillation: Secondary | ICD-10-CM

## 2021-09-18 DIAGNOSIS — I483 Typical atrial flutter: Secondary | ICD-10-CM

## 2021-09-18 DIAGNOSIS — I10 Essential (primary) hypertension: Secondary | ICD-10-CM | POA: Diagnosis not present

## 2021-09-18 MED ORDER — RIVAROXABAN 20 MG PO TABS: 20.0000 mg | ORAL_TABLET | Freq: Every day | ORAL | 5 refills | Status: DC

## 2021-09-18 MED ORDER — METOPROLOL SUCCINATE ER 25 MG PO TB24: 25.0000 mg | ORAL_TABLET | Freq: Every day | ORAL | 3 refills | Status: DC

## 2021-09-18 NOTE — Patient Instructions (Addendum)
Medications: ?Stop Metoprolol Tartrate ?Start Metoprolol Succinate 25 mg daily at bedtime ? ?Your physician recommends that you continue on your current medications as directed. Please refer to the Current Medication list given to you today. ?*If you need a refill on your cardiac medications before your next appointment, please call your pharmacy* ? ?Lab Work: ?None. ?If you have labs (blood work) drawn today and your tests are completely normal, you will receive your results only by: ?MyChart Message (if you have MyChart) OR ?A paper copy in the mail ?If you have any lab test that is abnormal or we need to change your treatment, we will call you to review the results. ? ?Testing/Procedures: ?None. ? ?Follow-Up: ?At [facility], you and your health needs are our priority.  As part of our continuing mission to provide you with exceptional heart care, we have created designated Provider Care Teams.  These Care Teams include your primary Cardiologist (physician) and Advanced Practice Providers (APPs -  Physician Assistants and Nurse Practitioners) who all work together to provide you with the care you need, when you need it. ? ?Your physician wants you to follow-up in: 9 months with Steffanie Dunn, MD  ? ?  You will receive a reminder letter in the mail two months in advance. If you don't receive a letter, please call our office to schedule the follow-up appointment. ? ?We recommend signing up for the patient portal called "MyChart".  Sign up information is provided on this After Visit Summary.  MyChart is used to connect with patients for Virtual Visits (Telemedicine).  Patients are able to view lab/test results, encounter notes, upcoming appointments, etc.  Non-urgent messages can be sent to your provider as well.   ?To learn more about what you can do with MyChart, go to [email or web address].   ? ?Any Other Special Instructions Will Be Listed Below (If Applicable). ? ?

## 2021-09-18 NOTE — Telephone Encounter (Signed)
Prescription refill request for Xarelto received.  ?Indication:Afib ?Last office visit:5/23 ?Weight:61.7 kg ?Age:76 ?Scr:0.5 ?CrCl:96.15 ml/min ? ?Prescription refilled ? ?

## 2021-10-22 ENCOUNTER — Other Ambulatory Visit: Payer: Self-pay | Admitting: Obstetrics and Gynecology

## 2021-10-26 NOTE — Telephone Encounter (Signed)
Patient needs appointment prior to next refill.  

## 2022-01-19 ENCOUNTER — Other Ambulatory Visit: Payer: Self-pay | Admitting: Obstetrics and Gynecology

## 2022-02-28 ENCOUNTER — Telehealth: Payer: Self-pay | Admitting: Cardiology

## 2022-02-28 NOTE — Telephone Encounter (Signed)
Calling to see if she needs to get the covid shot and is there a  RSVP shot that she can get.  Please advise

## 2022-03-03 ENCOUNTER — Encounter: Payer: Self-pay | Admitting: Cardiology

## 2022-03-03 NOTE — Telephone Encounter (Signed)
Pt calling back to f/u on call from Friday, 10/13 since she has not heard back. Please advise

## 2022-03-03 NOTE — Telephone Encounter (Signed)
Spoke with patient and advised her to consult PCP for immunization recommendations. Patient verbalized understanding and agreed with plan.

## 2022-03-05 ENCOUNTER — Other Ambulatory Visit: Payer: Self-pay

## 2022-03-05 MED ORDER — RIVAROXABAN 20 MG PO TABS: 20.0000 mg | ORAL_TABLET | Freq: Every day | ORAL | 1 refills | Status: DC

## 2022-03-05 NOTE — Telephone Encounter (Signed)
Prescription refill request for Xarelto received.  Indication:Afib Last office visit:5/23 Weight:61.7 kg Age:76 Scr:0.6 CrCl:80.12 ml/min  Prescription refilled

## 2022-03-13 ENCOUNTER — Ambulatory Visit: Payer: Self-pay

## 2022-03-13 NOTE — Telephone Encounter (Signed)
Pt has been notified.

## 2022-03-13 NOTE — Telephone Encounter (Signed)
Message from [place] sent at 03/13/2022  8:44 AM EDT  Summary: covid booster   Pt states she was informed by cardiologist to not get the covid booster shot and was advice to check w/ her pcp   Pt requesting a cb regarding if it is safe to get the booster shot   Please advise         Afib after having Covid infection. Cardiologist did not say that pt cannot take covid booster, but to check with PCP to see if ok to take. Called pt back and was advised that she just needs to know if it is ok to get covid booster.  Reason for Disposition  [1] Caller requesting NON-URGENT health information AND [2] PCP's office is the best resource  Answer Assessment - Initial Assessment Questions 1. REASON FOR CALL or QUESTION: "What is your reason for calling today?" or "How can I best help you?" or "What question do you have that I can help answer?"     Pt cardiologist stated that pt needs to contact PCP to see if can have Covid booster that is currently offered.  Protocols used: Information Only Call - No Triage-A-AH

## 2022-03-14 ENCOUNTER — Ambulatory Visit (INDEPENDENT_AMBULATORY_CARE_PROVIDER_SITE_OTHER): Payer: Medicare Other

## 2022-03-14 DIAGNOSIS — Z23 Encounter for immunization: Secondary | ICD-10-CM

## 2022-03-14 NOTE — Progress Notes (Signed)
Patient tolerated injection well with no adverse side effects noted.

## 2022-03-20 ENCOUNTER — Ambulatory Visit
Admission: RE | Admit: 2022-03-20 | Discharge: 2022-03-20 | Disposition: A | Discharge status: Home / Self Care | Payer: Medicare Other | Source: Ambulatory Visit | Attending: Family Medicine | Admitting: Family Medicine

## 2022-03-20 DIAGNOSIS — Z1231 Encounter for screening mammogram for malignant neoplasm of breast: Secondary | ICD-10-CM | POA: Insufficient documentation

## 2022-04-07 ENCOUNTER — Ambulatory Visit (INDEPENDENT_AMBULATORY_CARE_PROVIDER_SITE_OTHER): Payer: Medicare Other | Admitting: Family Medicine

## 2022-04-07 ENCOUNTER — Encounter: Payer: Self-pay | Admitting: Family Medicine

## 2022-04-07 VITALS — BP 126/84 | HR 76 | Temp 97.8°F | Resp 16 | Ht 59.0 in | Wt 139.7 lb

## 2022-04-07 DIAGNOSIS — H1032 Unspecified acute conjunctivitis, left eye: Secondary | ICD-10-CM | POA: Diagnosis not present

## 2022-04-07 MED ORDER — CROMOLYN SODIUM 4 % OP SOLN: 1.0000 [drp] | Freq: Four times a day (QID) | OPHTHALMIC | 2 refills | Status: DC | PRN

## 2022-04-07 NOTE — Progress Notes (Unsigned)
Patient ID: Zoe Alvarado, female    DOB: Nov 09, 1945, 76 y.o.   MRN: [id number]  PCP: [name], PA-C  Chief Complaint  Patient presents with   Conjunctivitis    Noticed red/pink left eye on 04/03/22, watery and itched. No mucus around eye    Subjective:   Zoe Alvarado is a 76 y.o. female, presents to clinic with CC of the following:  HPI  Left eye redness, irritation, some itching for 2-3 d She endorses left conjunctival redness and injection, she has not tried any medications though in the past with something similar she was using Pataday drops She is concerned and wants to know if it is an infection or contagious She has not had any spread to the right eye, she reports some pain around her eye or some mild swelling that has improved she denies any vision changes, eye pain with adjusting to light or vision, she denies any discharge from her eye no crusting tearing or purulence, further denies any associated URI/viral symptoms or known sick contacts     Patient Active Problem List   Diagnosis Date Noted   Typical atrial flutter (HCC) 07/11/2021   Secondary hypercoagulable state (HCC) 07/11/2021   Persistent atrial fibrillation ([facility])    Palpitations 03/13/2021   Cystocele with rectocele 05/05/2019   Post-menopause on HRT (hormone replacement therapy) 05/05/2019   OP (osteoporosis) 01/15/2017   Arthritis, degenerative 01/15/2017   Climacteric 01/15/2017   Obesity (BMI 30.0-34.9) 05/02/2015   Hypertension 12/06/2014   Dyslipidemia 12/06/2014      Current Outpatient Medications:    alendronate (FOSAMAX) 70 MG tablet, Take 70 mg by mouth every Tuesday. Take with a full glass of water on an empty stomach., Disp: , Rfl:    Calcium-Vitamin D-Vitamin K (VIACTIV CALCIUM PLUS D) 650-12.5-40 MG-MCG-MCG CHEW, Chew 2 each by mouth daily., Disp: , Rfl:    DM-Doxylamine-Acetaminophen (NYQUIL HBP COLD & FLU PO), Take 1 Dose by mouth daily as needed (cough)., Disp: , Rfl:     estradiol (ESTRACE) 0.5 MG tablet, Take 1 tablet by mouth once daily, Disp: 90 tablet, Rfl: 0   metoprolol succinate (TOPROL XL) 25 MG 24 hr tablet, Take 1 tablet (25 mg total) by mouth at bedtime., Disp: 90 tablet, Rfl: 3   Multiple Vitamins-Minerals (CENTRUM MINIS WOMEN 50+) TABS, Take 1 tablet by mouth 2 (two) times daily., Disp: , Rfl:    Propylene Glycol (SYSTANE COMPLETE) 0.6 % SOLN, Place 1 drop into both eyes in the morning and at bedtime., Disp: , Rfl:    rivaroxaban (XARELTO) 20 MG TABS tablet, Take 1 tablet (20 mg total) by mouth daily with supper., Disp: 90 tablet, Rfl: 1   sodium chloride (OCEAN) 0.65 % SOLN nasal spray, Place 1 spray into both nostrils daily as needed for congestion., Disp: , Rfl:    Allergies  Allergen Reactions   Codeine Rash    On face and shoulders   Penicillins Rash    On face and should / in 1970     Social History   Tobacco Use   Smoking status: Never   Smokeless tobacco: Never  Vaping Use   Vaping Use: Never used  Substance Use Topics   Alcohol use: No    Alcohol/week: 0.0 standard drinks of alcohol   Drug use: No      Chart Review Today: I personally reviewed active problem list, medication list, allergies, family history, social history, health maintenance, notes from last encounter, lab results, imaging  with the patient/caregiver today.   Review of Systems  Constitutional: Negative.   HENT: Negative.    Eyes: Negative.   Respiratory: Negative.    Cardiovascular: Negative.   Gastrointestinal: Negative.   Endocrine: Negative.   Genitourinary: Negative.   Musculoskeletal: Negative.   Skin: Negative.   Allergic/Immunologic: Negative.   Neurological: Negative.   Hematological: Negative.   Psychiatric/Behavioral: Negative.    All other systems reviewed and are negative.      Objective:   Vitals:   04/07/22 1327  BP: 126/84  Pulse: 76  Resp: 16  Temp: 97.8 F (36.6 C)  TempSrc: Oral  SpO2: 98%  Weight: 139 lb 11.2 oz  (63.4 kg)  Height: 4\' 11"  (1.499 m)    Body mass index is 28.22 kg/m.  Physical Exam Vitals and nursing note reviewed.  Constitutional:      General: She is not in acute distress.    Appearance: Normal appearance. She is well-developed. She is not ill-appearing, toxic-appearing or diaphoretic.  HENT:     Head: Normocephalic and atraumatic.     Right Ear: Hearing, tympanic membrane, ear canal and external ear normal.     Left Ear: Hearing, tympanic membrane, ear canal and external ear normal.     Nose: Nose normal. No mucosal edema, congestion or rhinorrhea.     Mouth/Throat:     Lips: Pink.     Mouth: Mucous membranes are moist.     Pharynx: Oropharynx is clear. Uvula midline. No pharyngeal swelling, oropharyngeal exudate or posterior oropharyngeal erythema.  Eyes:     General: Lids are normal. No allergic shiner, visual field deficit or scleral icterus.       Right eye: No foreign body or discharge.        Left eye: No foreign body or discharge.     Extraocular Movements:     Right eye: Normal extraocular motion.     Left eye: Normal extraocular motion.     Conjunctiva/sclera:     Right eye: Right conjunctiva is not injected. No chemosis, exudate or hemorrhage.    Left eye: Left conjunctiva is injected. No chemosis, exudate or hemorrhage.    Pupils: Pupils are equal, round, and reactive to light. Pupils are equal.     Slit lamp exam:    Right eye: No photophobia.     Left eye: No photophobia.     Comments: Left eye diffuse conjunctival injection throughout and palpebral conjunctive a injected and erythematous No lid swelling, edema, erythema No purulence  Neck:     Trachea: No tracheal deviation.  Cardiovascular:     Rate and Rhythm: Normal rate and regular rhythm.  Pulmonary:     Effort: Pulmonary effort is normal. No respiratory distress.     Breath sounds: No stridor.  Musculoskeletal:        General: Normal range of motion.  Skin:    General: Skin is warm and dry.      Findings: No rash.  Neurological:     Mental Status: She is alert.     Motor: No abnormal muscle tone.     Coordination: Coordination normal.  Psychiatric:        Behavior: Behavior normal.      Results for orders placed or performed in visit on 07/26/21  Lipid panel  Result Value Ref Range   Cholesterol, Total 175 100 - 199 mg/dL   Triglycerides 74 0 - 149 mg/dL   HDL 68 09/25/21 mg/dL   VLDL Cholesterol Cal  14 5 - 40 mg/dL   LDL Chol Calc (NIH) 93 0 - 99 mg/dL   Chol/HDL Ratio 2.6 0.0 - 4.4 ratio       Assessment & Plan:   1. Acute conjunctivitis of left eye, unspecified acute conjunctivitis type 3 days of left eye redness that started initially with a little bit of itching, she does not have significant itching right now there has been some intermittent discomfort around her eye that she states has improved somewhat on exam there is no evident periorbital or eyelid swelling, edema, erythema and no tenderness on exam she has diffuse redness and injection of her conjunctive a and inside her eyelids without any noted foreign body She denies any severe pain or vision changes she refused to do the vision screen here today though it was offered I explained to her that it may be viral or allergic but it is unlikely to be bacterial since she has had no purulent discharge She can try her allergy drops at home or I have sent in cromolyn which is a different allergy medication she can try It could conceivably still be a viral conjunctivitis I explained that this is treated with compresses either hot or cold and good hand hygiene and it will run its course usually spreads to both eyes and sometimes there is evidence of viral URI elsewhere in nose or throat which she does not have. Is also possible that she could have rubbed her eye and caused scleral or corneal abrasion though I doubt this because she lacks pain.  I explained limitations in clinic we do not have fluorescein stain or [place]  lamp And I strongly advised her to get into her own eye doctor across the street at [facility] because they would be able to do that simple testing and here we cannot  Further advised her to seek immediate follow-up with her eye doctor or present to the ER with any severe eye pain, swelling or redness occurring around her eye or any vision changes  For now the plan is to try cromolyn or Pataday drops she can also try something like Lumify to help with the redness and irritation I encouraged her to avoid Visine eyedrops but she can continue her Systane eyedrops which have been recommended by her eye doctor.  Encouraged warm or cold compresses and good hand hygiene  - cromolyn (OPTICROM) 4 % ophthalmic solution; Place 1 drop into both eyes 4 (four) times daily as needed.  Dispense: 10 mL; Refill: 2  If she did not have any improvement with allergy drops I did offer to send in erythromycin ointment that she could do as a backup antibiotic treatment or I could try to recheck her on Wednesday before the Thanksgiving holiday but she declined both of these.    [name], PA-C 04/07/22 1:48 PM

## 2022-04-07 NOTE — Patient Instructions (Signed)
Try continuing your systane drops and add allergy eye drops to it with either your pataday or the new cromolyn drops For redness and irritation you can try lumify drops over the counter  If anything gets worse I strongly recommend you go to [facility] so they can do more testing on you that we cannot do here.  Not sure which kind of conjunctivitis you have - may be allergic or viral  As long as you do not have vision loss or severe pain they are likely to improve with a little bit of supportive help    Allergic Conjunctivitis, Adult  Allergic conjunctivitis is inflammation of the clear membrane that covers the white part of your eye and the inner surface of your eyelid. This area is called the conjunctiva. This condition can make your eye red or pink. It can also make your eye feel itchy. This condition is not contagious. This means that it cannot be spread from one person to another person. What are the causes? This condition is caused by allergens. These are things that can cause an allergic reaction in some people. Common allergens include: Outdoor allergens, such as: Pollen, including pollen from grass and weeds. Mold. Car fumes. Indoor allergens, such as: Dust. Smoke. Mold. Proteins in a pet's pee (urine), saliva, or dander. Proteins that build up in contact lenses. What increases the risk? You are more likely to develop this condition if you have a family history of these things: Allergies. Conditions that you get because of allergens, such as asthma or inflammation of the skin (eczema). What are the signs or symptoms? Symptoms of this condition include eyes that are: Itchy. Red. Watery. Puffy. Your eyes may also: Sting or burn. Have clear fluid draining from them. Have thick mucus coming from them. This happens in severe cases. How is this treated? Treatment for this condition may include: Using cold, wet cloths (cold compresses) to soothe itching and  swelling. Washing the face, hair, and clothing to remove allergens. Using eye drops. These may include: Eye drops that block allergies. Eye drops that reduce swelling and irritation. Steroid eye drops if other treatments have not worked. Oral antihistamine medicines. These medicines lessen your allergies. You may need these if eye drops do not help or are difficult to use. Air purifier at home and work. Wrap around sunglasses. This may help to block allergens from reaching the eye. Not wearing contact lenses, if the doctor has found that contact lenses caused your symptoms. Use daily wear disposal contact lenses instead. Follow these instructions at home: Eye care Place a cool, clean washcloth on your eye for 10-20 minutes. Do this 3-4 times a day. Do not touch or rub your eyes. Do not wear contact lenses until the inflammation is gone. Wear glasses instead. Do not wear eye makeup until the inflammation is gone. General instructions Try not to be around things that you are allergic to. Take or apply over-the-counter and prescription medicines only as told by your doctor. These include any eye drops. Drink enough fluid to keep your pee pale yellow. Keep all follow-up visits. Contact a doctor if: Your symptoms get worse. Your symptoms do not get better with treatment. You have mild eye pain. You are sensitive to light. You have spots or blisters on your eyes. You have pus coming from your eye. You have a fever. Get help right away if: You have redness, swelling, or other symptoms in only one eye. You cannot see well. You have other  vision changes. You have very bad eye pain. Summary Allergic conjunctivitis is caused by allergens. It can make your eye red or pink, and it can make your eye feel itchy. This condition cannot be spread from one person to another person. Avoid things that you are allergic to. Take or apply over-the-counter and prescription medicines only as told by your  doctor. These include any eye drops. Contact your doctor if your symptoms get worse or they do not get better with treatment. This information is not intended to replace advice given to you by your health care provider. Make sure you discuss any questions you have with your health care provider. Document Revised: 07/12/2021 Document Reviewed: 07/12/2021 Elsevier Patient Education  2023 [employer].

## 2022-04-12 ENCOUNTER — Other Ambulatory Visit: Payer: Self-pay | Admitting: Obstetrics and Gynecology

## 2022-04-16 ENCOUNTER — Other Ambulatory Visit: Payer: Self-pay

## 2022-04-24 ENCOUNTER — Other Ambulatory Visit: Payer: Self-pay | Admitting: Obstetrics and Gynecology

## 2022-04-29 ENCOUNTER — Other Ambulatory Visit: Payer: Self-pay

## 2022-04-29 MED ORDER — ESTRADIOL 0.5 MG PO TABS: 0.5000 mg | ORAL_TABLET | Freq: Every day | ORAL | 0 refills | Status: DC

## 2022-04-29 NOTE — Telephone Encounter (Signed)
Patient is scheduled for 07/01/22 with Dr Valentino Saxon for medicare wellness exam

## 2022-04-29 NOTE — Telephone Encounter (Signed)
Pt is scheduled with Dr. Valentino Saxon on 07/01/22.

## 2022-06-02 ENCOUNTER — Telehealth: Payer: Self-pay | Admitting: Cardiology

## 2022-06-02 DIAGNOSIS — I4819 Other persistent atrial fibrillation: Secondary | ICD-10-CM

## 2022-06-02 MED ORDER — RIVAROXABAN 20 MG PO TABS: 20.0000 mg | ORAL_TABLET | Freq: Every day | ORAL | 1 refills | Status: DC

## 2022-06-02 NOTE — Telephone Encounter (Signed)
Prescription refill request for Xarelto received.  Indication: Afib  Last office visit: 09/18/21 Zoe Alvarado)  Weight: 63.4kg Age: 77 Scr:0.6 (02/25/22)  CrCl: 82.73ml/min  Appropriate dose and refill sent to requested pharmacy.

## 2022-06-02 NOTE — Telephone Encounter (Signed)
Refill request

## 2022-06-02 NOTE — Telephone Encounter (Signed)
*  STAT* If patient is at the pharmacy, call can be transferred to refill team.   1. Which medications need to be refilled? (please list name of each medication and dose if known)  rivaroxaban (XARELTO) 20 MG TABS tablet   2. Which pharmacy/location (including street and city if local pharmacy) is medication to be sent to?  [place], [place]    3. Do they need a 30 day or 90 day supply? 90 day supply

## 2022-06-12 ENCOUNTER — Telehealth: Payer: Self-pay | Admitting: Cardiology

## 2022-06-12 NOTE — Telephone Encounter (Signed)
I spoke with the patient. She advised that she missed her dose of Xarelto at supper last night as she did not eat and then missed her bedtime dose of metoprolol succinate.  Per the patient, she took her Metoprolol succinate ~ 6 am today.  I advised the patient to go ahead and take today's dose of Xarelto now with a snack / food, then resume her taking this at her regular time tomorrow.  The patient inquired if she should be ok to take her bedtime dose of metoprolol at bedtime tonight. I advised she should be fine to do that as it will have been more than 12 hours between doses.  The patient voices understanding and is agreeable. She was very appreciative of the call back.

## 2022-06-12 NOTE — Telephone Encounter (Signed)
Pt c/o medication issue:  1. Name of Medication:  rivaroxaban (XARELTO) 20 MG TABS tablet metoprolol succinate (TOPROL XL) 25 MG 24 hr tablet  2. How are you currently taking this medication (dosage and times per day)?   3. Are you having a reaction (difficulty breathing--STAT)?   4. What is your medication issue?   Patient states last night she forgot to take Xarelto and she would like to know what to do. She states she normally takes it with dinner, but last night she was not hungry so she did not eat dinner. Patient also mentions that she forgot her Metoprolol as well, but she took her normal dose when she remembered.

## 2022-06-26 NOTE — Patient Instructions (Signed)
Preventive Care 65 Years and Older, Female Preventive care refers to lifestyle choices and visits with your health care provider that can promote health and wellness. Preventive care visits are also called wellness exams. What can I expect for my preventive care visit? Counseling Your health care provider may ask you questions about your: Medical history, including: Past medical problems. Family medical history. Pregnancy and menstrual history. History of falls. Current health, including: Memory and ability to understand (cognition). Emotional well-being. Home life and relationship well-being. Sexual activity and sexual health. Lifestyle, including: Alcohol, nicotine or tobacco, and drug use. Access to firearms. Diet, exercise, and sleep habits. Work and work environment. Sunscreen use. Safety issues such as seatbelt and bike helmet use. Physical exam Your health care provider will check your: Height and weight. These may be used to calculate your BMI (body mass index). BMI is a measurement that tells if you are at a healthy weight. Waist circumference. This measures the distance around your waistline. This measurement also tells if you are at a healthy weight and may help predict your risk of certain diseases, such as type 2 diabetes and high blood pressure. Heart rate and blood pressure. Body temperature. Skin for abnormal spots. What immunizations do I need?  Vaccines are usually given at various ages, according to a schedule. Your health care provider will recommend vaccines for you based on your age, medical history, and lifestyle or other factors, such as travel or where you work. What tests do I need? Screening Your health care provider may recommend screening tests for certain conditions. This may include: Lipid and cholesterol levels. Hepatitis C test. Hepatitis B test. HIV (human immunodeficiency virus) test. STI (sexually transmitted infection) testing, if you are at  risk. Lung cancer screening. Colorectal cancer screening. Diabetes screening. This is done by checking your blood sugar (glucose) after you have not eaten for a while (fasting). Mammogram. Talk with your health care provider about how often you should have regular mammograms. BRCA-related cancer screening. This may be done if you have a family history of breast, ovarian, tubal, or peritoneal cancers. Bone density scan. This is done to screen for osteoporosis. Talk with your health care provider about your test results, treatment options, and if necessary, the need for more tests. Follow these instructions at home: Eating and drinking  Eat a diet that includes fresh fruits and vegetables, whole grains, lean protein, and low-fat dairy products. Limit your intake of foods with high amounts of sugar, saturated fats, and salt. Take vitamin and mineral supplements as recommended by your health care provider. Do not drink alcohol if your health care provider tells you not to drink. If you drink alcohol: Limit how much you have to 0-1 drink a day. Know how much alcohol is in your drink. In the [place] one drink equals one 12 oz bottle of beer (355 mL), one 5 oz glass of wine (148 mL), or one 1 oz glass of hard liquor (44 mL). Lifestyle Brush your teeth every morning and night with fluoride toothpaste. Floss one time each day. Exercise for at least 30 minutes 5 or more days each week. Do not use any products that contain nicotine or tobacco. These products include cigarettes, chewing tobacco, and vaping devices, such as e-cigarettes. If you need help quitting, ask your health care provider. Do not use drugs. If you are sexually active, practice safe sex. Use a condom or other form of protection in order to prevent STIs. Take aspirin only as told by   your health care provider. Make sure that you understand how much to take and what form to take. Work with your health care provider to find out whether it  is safe and beneficial for you to take aspirin daily. Ask your health care provider if you need to take a cholesterol-lowering medicine (statin). Find healthy ways to manage stress, such as: Meditation, yoga, or listening to music. Journaling. Talking to a trusted person. Spending time with friends and family. Minimize exposure to UV radiation to reduce your risk of skin cancer. Safety Always wear your seat belt while driving or riding in a vehicle. Do not drive: If you have been drinking alcohol. Do not ride with someone who has been drinking. When you are tired or distracted. While texting. If you have been using any mind-altering substances or drugs. Wear a helmet and other protective equipment during sports activities. If you have firearms in your house, make sure you follow all gun safety procedures. What's next? Visit your health care provider once a year for an annual wellness visit. Ask your health care provider how often you should have your eyes and teeth checked. Stay up to date on all vaccines. This information is not intended to replace advice given to you by your health care provider. Make sure you discuss any questions you have with your health care provider. Document Revised: 10/31/2020 Document Reviewed: 10/31/2020 Elsevier Patient Education  2023 [employer] Breast Self-Awareness Breast self-awareness is knowing how your breasts look and feel. You need to: Check your breasts on a regular basis. Tell your doctor about any changes. Become familiar with the look and feel of your breasts. This can help you catch a breast problem while it is still small and can be treated. You should do breast self-exams even if you have breast implants. What you need: A mirror. A well-lit room. A pillow or other soft object. How to do a breast self-exam Follow these steps to do a breast self-exam: Look for changes  Take off all the clothes above your waist. Stand in front of a  mirror in a room with good lighting. Put your hands down at your sides. Compare your breasts in the mirror. Look for any difference between them, such as: A difference in shape. A difference in size. Wrinkles, dips, and bumps in one breast and not the other. Look at each breast for changes in the skin, such as: Redness. Scaly areas. Skin that has gotten thicker. Dimpling. Open sores (ulcers). Look for changes in your nipples, such as: Fluid coming out of a nipple. Fluid around a nipple. Bleeding. Dimpling. Redness. A nipple that looks pushed in (retracted), or that has changed position. Feel for changes Lie on your back. Feel each breast. To do this: Pick a breast to feel. Place a pillow under the shoulder closest to that breast. Put the arm closest to that breast behind your head. Feel the nipple area of that breast using the hand of your other arm. Feel the area with the pads of your three middle fingers by making small circles with your fingers. Use light, medium, and firm pressure. Continue the overlapping circles, moving downward over the breast. Keep making circles with your fingers. Stop when you feel your ribs. Start making circles with your fingers again, this time going upward until you reach your collarbone. Then, make circles outward across your breast and into your armpit area. Squeeze your nipple. Check for discharge and lumps. Repeat these steps to check your other   breast. Sit or stand in the tub or shower. With soapy water on your skin, feel each breast the same way you did when you were lying down. Write down what you find Writing down what you find can help you remember what to tell your doctor. Write down: What is normal for each breast. Any changes you find in each breast. These include: The kind of changes you find. A tender or painful breast. Any lump you find. Write down its size and where it is. When you last had your monthly period (menstrual  cycle). General tips If you are breastfeeding, the best time to check your breasts is after you feed your baby or after you use a breast pump. If you get monthly bleeding, the best time to check your breasts is 5-7 days after your monthly cycle ends. With time, you will become comfortable with the self-exam. You will also start to know if there are changes in your breasts. Contact a doctor if: You see a change in the shape or size of your breasts or nipples. You see a change in the skin of your breast or nipples, such as red or scaly skin. You have fluid coming from your nipples that is not normal. You find a new lump or thick area. You have breast pain. You have any concerns about your breast health. Summary Breast self-awareness includes looking for changes in your breasts and feeling for changes within your breasts. You should do breast self-awareness in front of a mirror in a well-lit room. If you get monthly periods (menstrual cycles), the best time to check your breasts is 5-7 days after your period ends. Tell your doctor about any changes you see in your breasts. Changes include changes in size, changes on the skin, painful or tender breasts, or fluid from your nipples that is not normal. This information is not intended to replace advice given to you by your health care provider. Make sure you discuss any questions you have with your health care provider. Document Revised: 10/10/2021 Document Reviewed: 03/07/2021 Elsevier Patient Education  2023 [employer]  

## 2022-06-26 NOTE — Progress Notes (Signed)
ANNUAL PREVENTATIVE CARE GYNECOLOGY  ENCOUNTER NOTE  Subjective:       Zoe Alvarado is a 77 y.o. [phone] female here for a routine annual gynecologic exam.  She is s/p TAH in 1993 for abnormal uterine bleeding. 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 The patient is sexually active. The patient is not taking hormone replacement therapy. Patient denies post-menopausal vaginal bleeding. The patient wears seatbelts: yes. The patient participates in regular exercise: yes. Has the patient ever been transfused or tattooed?: no. The patient reports that there is not domestic violence in her life.  Current complaints: Reports experiencing some leakage of urine.  Notes urge and stress component, although urge is greater.  However overall notes that her symptoms have significantly improved since attending pelvic floor physical therapy ~ 2 years ago.  Reports being diagnosed with A-fib last year.  Has been on blood thinners. Doing well.      Gynecologic History No LMP recorded. Patient has had a hysterectomy. Contraception: status post hysterectomy Last Pap: 2014. Results were: normal Last mammogram: 03/20/2022. Results were: normal Last Colonoscopy: 02/20/2013: 10 years Last Dexa Scan: 02/16/2019. Osteoporosis, T score is -3.4.    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   Cataract years   Hyperlipidemia    Hypertension    Osteoporosis    Pneumonia    Prolapse of female pelvic organs     Family History  Problem Relation Age of Onset   Diabetes Mother    Heart  attack Father    Heart disease Father    Hypertension Father    Prostate cancer Father    Cancer Father    Vision loss Father    Diabetes Brother    Breast cancer Maternal Aunt 21   Cancer Maternal Aunt    Breast cancer Maternal Grandmother    Cancer Maternal Grandmother    Ovarian cancer Neg Hx    Colon cancer Neg Hx     Past Surgical History:  Procedure Laterality Date   ABDOMINAL HYSTERECTOMY     ATRIAL FIBRILLATION ABLATION N/A 06/13/2021   Procedure: ATRIAL FIBRILLATION ABLATION;  Surgeon: Vickie Epley, MD;  Location: [place] [facility];  Service: Cardiovascular;  Laterality: N/A;   CARDIOVERSION N/A 04/24/2021   Procedure: CARDIOVERSION;  Surgeon: Kate Sable, MD;  Location: [facility];  Service: Cardiovascular;  Laterality: N/A;   CATARACT EXTRACTION W/PHACO Left 11/24/2019   Procedure: CATARACT EXTRACTION PHACO AND INTRAOCULAR LENS PLACEMENT ([place]) LEFT Tyler Deis;  Surgeon: Leandrew Koyanagi, MD;  Location: [facility];  Service: Ophthalmology;  Laterality: Left;  cde 16.31 [place] 1:39 ap 16.5    CATARACT EXTRACTION W/PHACO Right 12/14/2019   Procedure: CATARACT EXTRACTION PHACO AND INTRAOCULAR LENS PLACEMENT ([facility]) RIGHT EYHANCE TORIC 3.77 00:41.5 901%;  Surgeon: Leandrew Koyanagi, MD;  Location: [place];  Service: Ophthalmology;  Laterality: Right;   CHOLECYSTECTOMY     DENTAL SURGERY     dental implants   DILATION AND CURETTAGE OF UTERUS     KNEE SURGERY     arthroscopy   TONSILLECTOMY      Social History   Socioeconomic History   Marital status: Married    Spouse name: Not on file   Number of children: 3   Years of education: Not on file   Highest education level: Not on file  Occupational History   Not on file  Tobacco Use   Smoking status: Never   Smokeless tobacco: Never  Vaping Use   Vaping Use: Never used  Substance and Sexual Activity   Alcohol use: No    Alcohol/week: 0.0 standard drinks of alcohol   Drug use: No   Sexual  activity: Yes    Partners: Male    Birth control/protection: Surgical  Other Topics Concern   Not on file  Social History Narrative   2nd marriage   3 living sons, 2 deceased   Social Determinants of Health   Financial Resource Strain: Not on file  Food Insecurity: Not on file  Transportation Needs: Not on file  Physical Activity: Not on file  Stress: Not on file  Social Connections: Unknown (06/24/2018)   Social Connection and Isolation Panel [NHANES]    Frequency of Communication with Friends and Family: Not asked    Frequency of Social Gatherings with Friends and Family: Not asked    Attends Religious Services: Not asked    Active Member of Clubs or Organizations: Not asked    Attends Archivist Meetings: Not on file    Marital Status: Not on file  Intimate Partner Violence: Not on file    Current Outpatient Medications on File Prior to Visit  Medication Sig Dispense Refill   alendronate (FOSAMAX) 70 MG tablet Take 70 mg by mouth every Tuesday. Take with a full glass of water on an empty stomach.     Calcium-Vitamin D-Vitamin K (VIACTIV CALCIUM PLUS D) 650-12.5-40 MG-MCG-MCG CHEW Chew 2 each by mouth daily.     cromolyn (OPTICROM) 4 % ophthalmic solution Place 1 drop into both eyes 4 (four) times daily as needed. 10 mL 2   DM-Doxylamine-Acetaminophen (NYQUIL HBP COLD & FLU PO) Take 1 Dose by mouth daily as needed (cough).     estradiol (ESTRACE) 0.5 MG tablet Take 1 tablet (0.5 mg total) by mouth daily. 90 tablet 0   metoprolol succinate (TOPROL XL) 25 MG 24 hr tablet Take 1 tablet (25 mg total) by mouth at bedtime. 90 tablet 3   Multiple Vitamins-Minerals (CENTRUM MINIS WOMEN 50+) TABS Take 1 tablet by mouth 2 (two) times daily.     Propylene Glycol (SYSTANE COMPLETE) 0.6 % SOLN Place 1 drop into both eyes in the morning and at bedtime.     rivaroxaban (XARELTO) 20 MG TABS tablet Take 1 tablet (20 mg total) by mouth daily with supper. 90 tablet 1   sodium chloride  (OCEAN) 0.65 % SOLN nasal spray Place 1 spray into both nostrils daily as needed for congestion.     No current facility-administered medications on file prior to visit.    Allergies  Allergen Reactions   Codeine Rash    On face and shoulders   Penicillins Rash    On face and should / in 1970      Review of Systems ROS Review of Systems -  General ROS: negative for - chills, fatigue, fever, hot flashes, night sweats, weight gain or weight loss Psychological ROS: negative for - anxiety, decreased libido, depression, mood swings, physical abuse or sexual abuse Ophthalmic ROS: negative for - blurry vision, eye pain or loss of vision ENT ROS: negative for - headaches, hearing change, visual changes or vocal changes Allergy and Immunology ROS: negative for - hives, itchy/watery eyes or seasonal allergies Hematological and Lymphatic ROS: negative for - bleeding problems, bruising, swollen lymph nodes or weight loss Endocrine ROS: negative for - galactorrhea, hair pattern changes, hot flashes, malaise/lethargy, mood swings, palpitations, polydipsia/polyuria, skin changes, temperature intolerance or unexpected weight changes Breast ROS: negative for - new or changing breast lumps or nipple discharge Respiratory ROS: negative for - cough or shortness of breath Cardiovascular ROS: negative for - chest pain, irregular heartbeat, palpitations or shortness of breath Gastrointestinal ROS: no abdominal pain, change in bowel habits, or black or bloody stools Genito-Urinary ROS: no dysuria, trouble voiding, or hematuria. Positive for urinary incontinence.  Musculoskeletal ROS: negative for - joint pain or joint stiffness Neurological ROS: negative for - bowel and bladder control changes Dermatological ROS: negative for rash and skin lesion changes   Objective:   BP (!) 147/81   Pulse 75   Resp 16   Ht 4' 11"$  (1.499 m)   Wt 139 lb 6.4 oz (63.2 kg)   BMI 28.16 kg/m  CONSTITUTIONAL:  Well-developed, well-nourished female in no acute distress.  PSYCHIATRIC: Normal mood and affect. Normal behavior. Normal judgment and thought content. Moody: Alert and oriented to person, place, and time. Normal muscle tone coordination. No cranial nerve deficit noted. HENT:  Normocephalic, atraumatic, External right and left ear normal. Oropharynx is clear and moist EYES: Conjunctivae and EOM are normal. Pupils are equal, round, and reactive to light. No scleral icterus.  NECK: Normal range of motion, supple, no masses.  Normal thyroid.  SKIN: Skin is warm and dry. No rash noted. Not diaphoretic. No erythema. No pallor. CARDIOVASCULAR: Normal heart rate noted, regular rhythm, no murmur. RESPIRATORY: Clear to auscultation bilaterally. Effort and breath sounds normal, no problems with respiration noted. BREASTS: Symmetric in size. No masses, skin changes, nipple drainage, or lymphadenopathy. ABDOMEN: Soft, normal bowel sounds, no distention noted.  No tenderness, rebound or guarding.  BLADDER: Normal PELVIC:  Bladder no bladder distension noted  Urethra: normal appearing urethra with no masses, tenderness or lesions  Vulva: normal appearing vulva with no masses, tenderness or lesions  Vagina: atrophic and Pelvic Floor Exam cystocele Grade 2, rectocele Grade 2, vaginal vault prolapse   Cervix: surgically absent  Uterus: surgically absent, vaginal cuff well healed  Adnexa: normal adnexa in size, nontender and no masses  RV: External Exam NormaI, No Rectal Masses, and Normal Sphincter tone  MUSCULOSKELETAL: Normal range of motion. No tenderness.  No cyanosis, clubbing, or edema.  2+ distal pulses. LYMPHATIC: No Axillary, Supraclavicular, or Inguinal Adenopathy.   Labs: Lab Results  Component Value Date   WBC 7.0 05/22/2021   HGB 13.8 05/22/2021   HCT 41.1 05/22/2021   MCV 87 05/22/2021   PLT 204 05/22/2021    Lab Results  Component Value Date   CREATININE 0.57 05/22/2021   BUN  11 05/22/2021   NA 139 05/22/2021   K 4.3 05/22/2021   CL 106 05/22/2021   CO2 21 05/22/2021    Lab Results  Component Value Date   ALT 13 10/31/2019   AST 17 10/31/2019   ALKPHOS 106 08/03/2015  BILITOT 0.5 10/31/2019    Lab Results  Component Value Date   CHOL 175 07/26/2021   HDL 68 07/26/2021   LDLCALC 93 07/26/2021   TRIG 74 07/26/2021   CHOLHDL 2.6 07/26/2021    Lab Results  Component Value Date   TSH 1.54 03/13/2021    Lab Results  Component Value Date   HGBA1C 4.6 03/13/2021     Assessment:   1. Encounter for well woman exam with routine gynecological exam   2. Primary hypertension   3. Dyslipidemia   4. Cystocele with rectocele   5. Age-related osteoporosis without current pathological fracture   6. 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 - Colon Screening:   UTD - Labs:  performed by PCP. - Routine preventative health maintenance measures emphasized: Exercise/Diet/Weight control, Self Breast Exams - Hypertension and dyslipidemia managed by PCP. - Currently being treated with Fosamax for her osteoporosis. - Mixed urinary incontinence in vaginal prolapse, has improved with pelvic floor physical therapy, notes that it is not as bad.  I discussed treatment options if her symptoms become to the point where she desires intervention. - Return to [place] or every other year based on insurance.   Rubie Maid, MD [place]

## 2022-07-01 ENCOUNTER — Encounter: Payer: Self-pay | Admitting: Obstetrics and Gynecology

## 2022-07-01 ENCOUNTER — Ambulatory Visit (INDEPENDENT_AMBULATORY_CARE_PROVIDER_SITE_OTHER): Payer: Medicare Other | Admitting: Obstetrics and Gynecology

## 2022-07-01 VITALS — BP 132/73 | HR 75 | Resp 16 | Ht 59.0 in | Wt 139.4 lb

## 2022-07-01 DIAGNOSIS — Z01419 Encounter for gynecological examination (general) (routine) without abnormal findings: Secondary | ICD-10-CM

## 2022-07-01 DIAGNOSIS — E785 Hyperlipidemia, unspecified: Secondary | ICD-10-CM

## 2022-07-01 DIAGNOSIS — N3946 Mixed incontinence: Secondary | ICD-10-CM

## 2022-07-01 DIAGNOSIS — Z131 Encounter for screening for diabetes mellitus: Secondary | ICD-10-CM

## 2022-07-01 DIAGNOSIS — I10 Essential (primary) hypertension: Secondary | ICD-10-CM

## 2022-07-01 DIAGNOSIS — M810 Age-related osteoporosis without current pathological fracture: Secondary | ICD-10-CM

## 2022-07-01 DIAGNOSIS — N8110 Cystocele, unspecified: Secondary | ICD-10-CM

## 2022-08-20 ENCOUNTER — Ambulatory Visit: Payer: Medicare Other | Attending: Cardiology | Admitting: Cardiology

## 2022-08-20 ENCOUNTER — Encounter: Payer: Self-pay | Admitting: Cardiology

## 2022-08-20 VITALS — BP 144/88 | HR 76 | Ht 59.0 in | Wt 136.2 lb

## 2022-08-20 DIAGNOSIS — I10 Essential (primary) hypertension: Secondary | ICD-10-CM

## 2022-08-20 DIAGNOSIS — I4819 Other persistent atrial fibrillation: Secondary | ICD-10-CM

## 2022-08-20 NOTE — Progress Notes (Signed)
Cardiology Office Note:    Date:  08/20/2022   ID:  Zoe Alvarado, DOB 1945-06-22, MRN [id number]  PCP:  Delsa Grana, PA-C   [facility] Providers Cardiologist:  Kate Sable, MD Electrophysiologist:  Vickie Epley, MD     Referring MD: Delsa Grana, PA-C   Chief Complaint  Patient presents with   Follow-up    1 yr f/u.  Patient denies problems/concerns today.       History of Present Illness:    Zoe Alvarado is a 77 y.o. female with a hx of persistent atrial fibrillation s/p RFA 05/2021, hypertension who presents for follow-up.   Denies chest pain or palpitations.  Tolerating Xarelto for stroke prophylaxis with no adverse effects.  Has felt well since her ablation over a year ago.  Had maybe 1 occurrence of her monitor device stating A-fib, but overall she feels well.  No concerns at this time.   Prior notes DC cardioversion 04/2021 Echocardiogram 03/2021 did show preserved ejection fraction, EF 50 to 55%.  Mildly dilated LA.   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   Cataract years   Hyperlipidemia    Hypertension    Osteoporosis    Pneumonia    Prolapse of female pelvic organs     Past Surgical History:  Procedure Laterality Date   ABDOMINAL HYSTERECTOMY     ATRIAL FIBRILLATION ABLATION N/A 06/13/2021   Procedure: ATRIAL FIBRILLATION ABLATION;  Surgeon: Vickie Epley, MD;  Location: [place] [facility];  Service: Cardiovascular;  Laterality: N/A;   CARDIOVERSION N/A 04/24/2021   Procedure: CARDIOVERSION;  Surgeon: Kate Sable, MD;  Location: [facility];  Service: Cardiovascular;  Laterality: N/A;   CATARACT EXTRACTION W/PHACO Left 11/24/2019   Procedure: CATARACT EXTRACTION PHACO AND INTRAOCULAR LENS PLACEMENT ([place]) LEFT Tyler Deis;  Surgeon: Leandrew Koyanagi, MD;  Location: [facility];  Service: Ophthalmology;  Laterality: Left;  cde  16.31 [place] 1:39 ap 16.5    CATARACT EXTRACTION W/PHACO Right 12/14/2019   Procedure: CATARACT EXTRACTION PHACO AND INTRAOCULAR LENS PLACEMENT ([facility]) RIGHT EYHANCE TORIC 3.77 00:41.5 901%;  Surgeon: Leandrew Koyanagi, MD;  Location: [place];  Service: Ophthalmology;  Laterality: Right;   CHOLECYSTECTOMY     DENTAL SURGERY     dental implants   DILATION AND CURETTAGE OF UTERUS     KNEE SURGERY     arthroscopy   TONSILLECTOMY      Current Medications: Current Meds  Medication Sig   alendronate (FOSAMAX) 70 MG tablet Take 70 mg by mouth every Tuesday. Take with a full glass of water on an empty stomach.   Calcium-Vitamin D-Vitamin K (VIACTIV CALCIUM PLUS D) 650-12.5-40 MG-MCG-MCG CHEW Chew 2 each by mouth daily.   estradiol (ESTRACE) 0.5 MG tablet Take 1 tablet (0.5 mg total) by mouth daily.   metoprolol succinate (TOPROL XL) 25 MG 24 hr tablet Take 1 tablet (25 mg total) by mouth at bedtime.   Multiple Vitamins-Minerals (CENTRUM MINIS WOMEN 50+) TABS Take 1 tablet by mouth 2 (two) times daily.   Propylene Glycol (SYSTANE COMPLETE) 0.6 % SOLN Place 1 drop into both eyes in the morning and at bedtime.   rivaroxaban (XARELTO) 20 MG TABS tablet Take 1 tablet (20 mg total) by mouth daily with supper.   sodium chloride (OCEAN) 0.65 % SOLN nasal spray Place 1 spray into both nostrils daily as needed  for congestion.     Allergies:   Codeine and Penicillins   Social History   Socioeconomic History   Marital status: Married    Spouse name: Not on file   Number of children: 3   Years of education: Not on file   Highest education level: Not on file  Occupational History   Not on file  Tobacco Use   Smoking status: Never   Smokeless tobacco: Never  Vaping Use   Vaping Use: Never used  Substance and Sexual Activity   Alcohol use: No    Alcohol/week: 0.0 standard drinks of alcohol   Drug use: No   Sexual activity: Yes    Partners: Male    Birth control/protection: Surgical   Other Topics Concern   Not on file  Social History Narrative   2nd marriage   3 living sons, 2 deceased   Social Determinants of Health   Financial Resource Strain: Not on file  Food Insecurity: Not on file  Transportation Needs: Not on file  Physical Activity: Not on file  Stress: Not on file  Social Connections: Unknown (06/24/2018)   Social Connection and Isolation Panel [NHANES]    Frequency of Communication with Friends and Family: Not asked    Frequency of Social Gatherings with Friends and Family: Not asked    Attends Religious Services: Not asked    Active Member of Clubs or Organizations: Not asked    Attends Archivist Meetings: Not on file    Marital Status: Not on file     Family History: The patient's family history includes Breast cancer in her maternal grandmother; Breast cancer (age of onset: 37) in her maternal aunt; Cancer in her father, maternal aunt, and maternal grandmother; Diabetes in her brother and mother; Heart attack in her father; Heart disease in her father; Hypertension in her father; Prostate cancer in her father; Vision loss in her father. There is no history of Ovarian cancer or Colon cancer.  ROS:   Please see the history of present illness.     All other systems reviewed and are negative.  EKGs/Labs/Other Studies Reviewed:    The following studies were reviewed today:   EKG:  EKG not ordered today.  EKG obtained 07/11/2021 showed normal sinus rhythm.  Recent Labs: No results found for requested labs within last 365 days.  Recent Lipid Panel    Component Value Date/Time   CHOL 175 07/26/2021 0809   TRIG 74 07/26/2021 0809   HDL 68 07/26/2021 0809   CHOLHDL 2.6 07/26/2021 0809   CHOLHDL 2.7 05/02/2019 0000   LDLCALC 93 07/26/2021 0809   LDLCALC 85 05/02/2019 0000     Risk Assessment/Calculations:          Physical Exam:    VS:  BP (!) 144/88 (BP Location: Left Arm, Patient Position: Sitting, Cuff Size: Normal)    Pulse 76   Ht 4\' 11"  (1.499 m)   Wt 136 lb 3.2 oz (61.8 kg)   SpO2 98%   BMI 27.51 kg/m     Wt Readings from Last 3 Encounters:  08/20/22 136 lb 3.2 oz (61.8 kg)  07/01/22 139 lb 6.4 oz (63.2 kg)  04/07/22 139 lb 11.2 oz (63.4 kg)     GEN:  Well nourished, well developed in no acute distress HEENT: Normal NECK: No JVD; No carotid bruits CARDIAC: Regular rate and rhythm, no murmurs RESPIRATORY:  Clear to auscultation without rales, wheezing or rhonchi  ABDOMEN: Soft, non-tender, non-distended MUSCULOSKELETAL:  No edema; No deformity  SKIN: Warm and dry NEUROLOGIC:  Alert and oriented x 3 PSYCHIATRIC:  Normal affect   ASSESSMENT:    1. Persistent atrial fibrillation   2. Primary hypertension    PLAN:    In order of problems listed above:  Persistent atrial fibrillation, s/p RFA 05/2021, CHA2DS2-VASc of  3(age, gender, htn).  Maintaining sinus rhythm.  Toprol-XL 25 mg daily, Xarelto 20 mg daily.   Hypertension, BP elevated, usually controlled.  Continue Toprol-XL 25 mg daily..  Follow-up in 1 year.  Medication Adjustments/Labs and Tests Ordered: Current medicines are reviewed at length with the patient today.  Concerns regarding medicines are outlined above.  Orders Placed This Encounter  Procedures   EKG 12-Lead    No orders of the defined types were placed in this encounter.    Patient Instructions  Medication Instructions:  Your physician recommends that you continue on your current medications as directed. Please refer to the Current Medication list given to you today.  *If you need a refill on your cardiac medications before your next appointment, please call your pharmacy*  Lab Work: No labs ordered  If you have labs (blood work) drawn today and your tests are completely normal, you will receive your results only by: [place] (if you have MyChart) OR A paper copy in the mail If you have any lab test that is abnormal or we need to change your  treatment, we will call you to review the results.  Testing/Procedures: No testing ordered  Follow-Up: At [facility], you and your health needs are our priority.  As part of our continuing mission to provide you with exceptional heart care, we have created designated Provider Care Teams.  These Care Teams include your primary Cardiologist (physician) and Advanced Practice Providers (APPs -  Physician Assistants and Nurse Practitioners) who all work together to provide you with the care you need, when you need it.  We recommend signing up for the patient portal called "MyChart".  Sign up information is provided on this After Visit Summary.  MyChart is used to connect with patients for Virtual Visits (Telemedicine).  Patients are able to view lab/test results, encounter notes, upcoming appointments, etc.  Non-urgent messages can be sent to your provider as well.   To learn more about what you can do with MyChart, go to [email or web address].    Your next appointment:   1 year(s)  Provider:   You may see Kate Sable, MD or one of the following Advanced Practice Providers on your designated Care Team:   Murray Hodgkins, NP Christell Faith, PA-C Cadence Kathlen Mody, PA-C Gerrie Nordmann, NP    Signed, Kate Sable, MD  08/20/2022 11:32 AM    [place]

## 2022-08-20 NOTE — Patient Instructions (Signed)
Medication Instructions:  Your physician recommends that you continue on your current medications as directed. Please refer to the Current Medication list given to you today.  *If you need a refill on your cardiac medications before your next appointment, please call your pharmacy*   Lab Work: No labs ordered  If you have labs (blood work) drawn today and your tests are completely normal, you will receive your results only by: MyChart Message (if you have MyChart) OR A paper copy in the mail If you have any lab test that is abnormal or we need to change your treatment, we will call you to review the results.   Testing/Procedures: No testing ordered  Follow-Up: At [facility], you and your health needs are our priority.  As part of our continuing mission to provide you with exceptional heart care, we have created designated Provider Care Teams.  These Care Teams include your primary Cardiologist (physician) and Advanced Practice Providers (APPs -  Physician Assistants and Nurse Practitioners) who all work together to provide you with the care you need, when you need it.  We recommend signing up for the patient portal called "MyChart".  Sign up information is provided on this After Visit Summary.  MyChart is used to connect with patients for Virtual Visits (Telemedicine).  Patients are able to view lab/test results, encounter notes, upcoming appointments, etc.  Non-urgent messages can be sent to your provider as well.   To learn more about what you can do with MyChart, go to [email or web address].    Your next appointment:   1 year(s)  Provider:   You may see Brian Agbor-Etang, MD or one of the following Advanced Practice Providers on your designated Care Team:   Christopher Berge, NP Ryan Dunn, PA-C Cadence Furth, PA-C Sheri Hammock, NP  

## 2022-09-06 ENCOUNTER — Other Ambulatory Visit: Payer: Self-pay | Admitting: Cardiology

## 2022-09-11 NOTE — Progress Notes (Signed)
Cardiology Office Note Date:  09/12/2022  Patient ID:  Zoe, Alvarado Jul 31, 1945, MRN [id number] PCP:  [name], PA-C  Cardiologist:  Debbe Odea, MD Electrophysiologist: Lanier Prude, MD    Chief Complaint: 1 year follow-up Afib   History of Present Illness: Zoe Alvarado is a 77 y.o. female with PMH notable for persis Afib, aflutter, HTN; seen today for Lanier Prude, MD for routine electrophysiology followup.  Last saw Dr. Lalla Brothers 09/2021. She is s/p AFib ablation 05/2021 maintaining SR.  Today, she tells me that she overall feels very well. Recently started weight-watchers and has lost a couple pounds. Joined a gym and goes a couple times a week, enjoys the seated elliptical. Activity limited at times d/t knee pain.  She checks BP a couple times a month, readings are 90-110s, rarely 120s. She is asymptomatic during times when BP is in 90s.  She has a Kardia mobile, checks about once a week or so. She had one reading in Jan 2024 that was AFib w v-rates in 120s. She has the reading on her phone today for review. She was completely asymptomatic during episode, just happen to check Kardia at the time. Historically, when she was in AFib in the past, felt her heart racing during rest, rarely palpitations.   No bleeding concerns on xarelto  .  she denies chest pain, palpitations, dyspnea, PND, orthopnea, nausea, vomiting, dizziness, syncope, edema, weight gain, or early satiety.     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   Cataract years   Hyperlipidemia    Hypertension    Osteoporosis    Pneumonia    Prolapse of female pelvic organs     Past Surgical History:  Procedure Laterality Date   ABDOMINAL HYSTERECTOMY     ATRIAL FIBRILLATION ABLATION N/A 06/13/2021   Procedure: ATRIAL FIBRILLATION ABLATION;  Surgeon: Lanier Prude, MD;  Location: [facility];  Service: Cardiovascular;  Laterality: N/A;   CARDIOVERSION N/A 04/24/2021   Procedure: CARDIOVERSION;  Surgeon: Debbe Odea, MD;  Location: [facility];  Service: Cardiovascular;  Laterality: N/A;   CATARACT EXTRACTION W/PHACO Left 11/24/2019   Procedure: CATARACT EXTRACTION PHACO AND INTRAOCULAR LENS PLACEMENT (IOC) LEFT Zoe Alvarado;  Surgeon: Lockie Mola, MD;  Location: [facility];  Service: Ophthalmology;  Laterality: Left;  cde 16.31 [place] 1:39 ap 16.5    CATARACT EXTRACTION W/PHACO Right 12/14/2019   Procedure: CATARACT EXTRACTION PHACO AND INTRAOCULAR LENS PLACEMENT (IOC) RIGHT EYHANCE TORIC 3.77 00:41.5 901%;  Surgeon: Lockie Mola, MD;  Location: [facility];  Service: Ophthalmology;  Laterality: Right;   CHOLECYSTECTOMY     DENTAL SURGERY     dental implants   DILATION AND CURETTAGE OF UTERUS     KNEE SURGERY     arthroscopy   TONSILLECTOMY      Current Outpatient Medications  Medication Instructions   acetaminophen (TYLENOL) 650 mg, Oral, As needed   alendronate (FOSAMAX) 70 mg, Oral, Every Tue, Take with a full glass of water on an empty stomach.   Calcium-Vitamin D-Vitamin K (VIACTIV CALCIUM PLUS D) 650-12.5-40 MG-MCG-MCG CHEW 2 each, Oral, Daily   estradiol (ESTRACE) 0.5 mg, Oral, Daily   metoprolol succinate (TOPROL-XL) 25 mg, Oral, Daily at bedtime   Multiple Vitamins-Minerals (CENTRUM MINIS WOMEN 50+) TABS 1 tablet, Oral, 2 times daily   Propylene Glycol (SYSTANE COMPLETE) 0.6 %  SOLN 1 drop, Both Eyes, 2 times daily   rivaroxaban (XARELTO) 20 mg, Oral, Daily with supper   sodium chloride (OCEAN) 0.65 % SOLN nasal spray 1 spray, Each Nare, Daily PRN    Social History:  The patient  reports that she has never smoked. She has never used smokeless tobacco. She reports that she does not drink alcohol and does not use drugs.   Family History:  The patient's family history includes Breast cancer in her maternal grandmother; Breast cancer (age of  onset: 70) in her maternal aunt; Cancer in her father, maternal aunt, and maternal grandmother; Diabetes in her brother and mother; Heart attack in her father; Heart disease in her father; Hypertension in her father; Prostate cancer in her father; Vision loss in her father.  ROS:  Please see the history of present illness. All other systems are reviewed and otherwise negative.   PHYSICAL EXAM:  VS:  BP 126/74 (BP Location: Left Arm, Patient Position: Sitting, Cuff Size: Normal)   Pulse 76   Ht 4\' 11"  (1.499 m)   Wt 133 lb (60.3 kg)   SpO2 98%   BMI 26.86 kg/m  BMI: Body mass index is 26.86 kg/m.  GEN- The patient is well appearing, alert and oriented x 3 today.   Lungs- Clear to ausculation bilaterally, normal work of breathing.  Heart- Regular rate and rhythm, no murmurs, rubs or gallops Extremities- Trace peripheral edema, warm, dry   EKG is ordered. Personal review of EKG from today shows:   NSR, rate 76bpm  Kardia mobile. Personal review of tracing from January 2024  AFib  Recent Labs: No results found for requested labs within last 365 days.  No results found for requested labs within last 365 days.   CrCl cannot be calculated (Patient's most recent lab result is older than the maximum 21 days allowed.).   Wt Readings from Last 3 Encounters:  09/12/22 133 lb (60.3 kg)  08/20/22 136 lb 3.2 oz (61.8 kg)  07/01/22 139 lb 6.4 oz (63.2 kg)     Additional studies reviewed include: Previous EP, cardiology notes.   AF ablation, 06/13/2021 1. Successful PVI 2. Successful ablation/isolation of the posterior wall 3.  Successful ablation of the cavotricuspid isthmus for right-sided atrial flutter 4. Intracardiac echo reveals trivial pericardial effusion, dilated left atrium 5. No early apparent complications. 6.  Colchicine 0.6 mg by mouth twice daily for 5 days 7.  Protonix 40 mg by mouth once a day for 45 days  TTE, 03/26/2021  1. Left ventricular ejection fraction, by  estimation, is 50 to 55%. The left ventricle has low normal function. The left ventricle has no regional wall motion abnormalities. Left ventricular diastolic parameters are indeterminate.   2. Right ventricular systolic function is normal. The right ventricular size is normal. There is normal pulmonary artery systolic pressure. The estimated right ventricular systolic pressure is 25.6 mmHg.   3. Left atrial size was mildly dilated.   4. The mitral valve is normal in structure. Mild mitral valve regurgitation. No evidence of mitral stenosis.   5. Tricuspid valve regurgitation is mild to moderate.   6. The aortic valve is normal in structure. Aortic valve regurgitation is not visualized. No aortic stenosis is present.   7. The inferior vena cava is normal in size with greater than 50% respiratory variability, suggesting right atrial pressure of 3 mmHg.    ASSESSMENT AND PLAN:  #) peris Afib #) Aflutter S/p AF ablation, PVI, CTI Minimal AF burden  by Lourena Simmonds mobile, no symptomatic episodes We discussed ongoing monitoring using Kardia mobile, ok to send tracings via mychart for review Continue toprol CHA2DS2-VASc Score = 4 [CHF History: 0, HTN History: 1, Diabetes History: 0, Stroke History: 0, Vascular Disease History: 0, Age Score: 2, Gender Score: 1].  Therefore, the patient's annual risk of stroke is 4.8 % OAC - xarelto 20mg  daily, appropriately dosed for CrCl > 51ml/min.      #) HTN Well-controlled in office and by home readings Discussed with ongoing lifestyle modifications, monitor for s/s of hypotension   Current medicines are reviewed at length with the patient today.   The patient does not have concerns regarding her medicines.  The following changes were made today:  none  Labs/ tests ordered today include:  Orders Placed This Encounter  Procedures   EKG 12-Lead     Disposition: Follow up with Dr. Lalla Brothers or EP APP in 12 months   Signed, Sherie Don, NP  09/12/22   10:03 AM  Electrophysiology [facility]

## 2022-09-12 ENCOUNTER — Ambulatory Visit: Payer: Medicare Other | Attending: Cardiology | Admitting: Cardiology

## 2022-09-12 ENCOUNTER — Encounter: Payer: Self-pay | Admitting: Cardiology

## 2022-09-12 VITALS — BP 126/74 | HR 76 | Ht 59.0 in | Wt 133.0 lb

## 2022-09-12 DIAGNOSIS — I4819 Other persistent atrial fibrillation: Secondary | ICD-10-CM | POA: Diagnosis not present

## 2022-09-12 DIAGNOSIS — I10 Essential (primary) hypertension: Secondary | ICD-10-CM | POA: Diagnosis not present

## 2022-09-12 DIAGNOSIS — I483 Typical atrial flutter: Secondary | ICD-10-CM | POA: Diagnosis not present

## 2022-09-12 DIAGNOSIS — D6869 Other thrombophilia: Secondary | ICD-10-CM

## 2022-09-12 NOTE — Patient Instructions (Signed)
Medication Instructions:  Your physician recommends that you continue on your current medications as directed. Please refer to the Current Medication list given to you today.  *If you need a refill on your cardiac medications before your next appointment, please call your pharmacy*  Lab Work: No labs ordered  If you have labs (blood work) drawn today and your tests are completely normal, you will receive your results only by: MyChart Message (if you have MyChart) OR A paper copy in the mail If you have any lab test that is abnormal or we need to change your treatment, we will call you to review the results.  Testing/Procedures: No testing  Follow-Up: At [facility], you and your health needs are our priority.  As part of our continuing mission to provide you with exceptional heart care, we have created designated Provider Care Teams.  These Care Teams include your primary Cardiologist (physician) and Advanced Practice Providers (APPs -  Physician Assistants and Nurse Practitioners) who all work together to provide you with the care you need, when you need it.  We recommend signing up for the patient portal called "MyChart".  Sign up information is provided on this After Visit Summary.  MyChart is used to connect with patients for Virtual Visits (Telemedicine).  Patients are able to view lab/test results, encounter notes, upcoming appointments, etc.  Non-urgent messages can be sent to your provider as well.   To learn more about what you can do with MyChart, go to [email or web address].    Your next appointment:   1 year(s)  Provider:   Steffanie Dunn, MD or Sherie Don, NP

## 2022-09-19 ENCOUNTER — Telehealth: Payer: Self-pay | Admitting: Cardiology

## 2022-09-19 NOTE — Telephone Encounter (Signed)
Patient seen by Sherie Don NP on 4/26. Forwarding  to provider to clarify EKG interpretations.

## 2022-09-19 NOTE — Telephone Encounter (Signed)
   Pt is calling regarding her EKG result on 04/26, she saw it on her mychart and she read "anterior infarct" she is not sure what it means but when she goggled it, it something about having a heart attack, she would like to confirm what it means. She said, to call her at home number if she didn't answer to call her mobile number

## 2022-09-19 NOTE — Telephone Encounter (Signed)
Called and spoke with patient regarding her EKG. Informed patient of Sherie Don NP interpretation of EKG. Patient verbalized understanding.

## 2022-09-19 NOTE — Telephone Encounter (Signed)
Sherie Don, NP  Jani Gravel, RN Cc: Vida Rigger Div Burl Triage Caller: Unspecified (Today, 10:33 AM) I do not appreciate an anterior infarct when re-reviewing the EKG. Her EKG looks stable compared to previous ones.

## 2022-10-07 ENCOUNTER — Ambulatory Visit (INDEPENDENT_AMBULATORY_CARE_PROVIDER_SITE_OTHER): Payer: Medicare Other | Admitting: Family Medicine

## 2022-10-07 ENCOUNTER — Ambulatory Visit: Payer: Self-pay

## 2022-10-07 VITALS — BP 128/74 | HR 80 | Temp 97.7°F | Resp 16 | Ht <= 58 in | Wt 132.0 lb

## 2022-10-07 DIAGNOSIS — J029 Acute pharyngitis, unspecified: Secondary | ICD-10-CM

## 2022-10-07 DIAGNOSIS — J329 Chronic sinusitis, unspecified: Secondary | ICD-10-CM

## 2022-10-07 DIAGNOSIS — M25561 Pain in right knee: Secondary | ICD-10-CM | POA: Diagnosis not present

## 2022-10-07 DIAGNOSIS — G8929 Other chronic pain: Secondary | ICD-10-CM

## 2022-10-07 DIAGNOSIS — J069 Acute upper respiratory infection, unspecified: Secondary | ICD-10-CM

## 2022-10-07 DIAGNOSIS — I4819 Other persistent atrial fibrillation: Secondary | ICD-10-CM

## 2022-10-07 DIAGNOSIS — S51811A Laceration without foreign body of right forearm, initial encounter: Secondary | ICD-10-CM

## 2022-10-07 DIAGNOSIS — I700 Atherosclerosis of aorta: Secondary | ICD-10-CM

## 2022-10-07 LAB — POCT RAPID STREP A (OFFICE): Rapid Strep A Screen: NEGATIVE

## 2022-10-07 MED ORDER — LORATADINE 10 MG PO TABS
10.0000 mg | ORAL_TABLET | Freq: Every day | ORAL | 1 refills | Status: DC
Start: 2022-10-07 — End: 2022-12-03

## 2022-10-07 NOTE — Patient Instructions (Signed)

## 2022-10-07 NOTE — Telephone Encounter (Signed)
     Chief Complaint: Sinus pain, pressure. Yellow discharge. Symptoms: Above Frequency: The weekend Pertinent Negatives: Patient denies fever Disposition: [] ED /[] Urgent Care (no appt availability in office) / [x] Appointment(In office/virtual)/ []  [place] [facility]/ [] Home Care/ [] Refused Recommended Disposition /[] [place] [facility]/ []  Follow-up with PCP Additional Notes:   Reason for Disposition  [1] Using nasal washes and pain medicine > 24 hours AND [2] sinus pain (around cheekbone or eye) persists  Answer Assessment - Initial Assessment Questions 1. LOCATION: "Where does it hurt?"      Face, eyes, cheek 2. ONSET: "When did the sinus pain start?"  (e.g., hours, days)      This weekend 3. SEVERITY: "How bad is the pain?"   (Scale 1-10; mild, moderate or severe)   - MILD (1-3): doesn't interfere with normal activities    - MODERATE (4-7): interferes with normal activities (e.g., work or school) or awakens from sleep   - SEVERE (8-10): excruciating pain and patient unable to do any normal activities        Moderate 4. RECURRENT SYMPTOM: "Have you ever had sinus problems before?" If Yes, ask: "When was the last time?" and "What happened that time?"      Yes 5. NASAL CONGESTION: "Is the nose blocked?" If Yes, ask: "Can you open it or must you breathe through your mouth?"     No 6. NASAL DISCHARGE: "Do you have discharge from your nose?" If so ask, "What color?"     Yellow - clear 7. FEVER: "Do you have a fever?" If Yes, ask: "What is it, how was it measured, and when did it start?"      No 8. OTHER SYMPTOMS: "Do you have any other symptoms?" (e.g., sore throat, cough, earache, difficulty breathing)     Slight sore throat 9. PREGNANCY: "Is there any chance you are pregnant?" "When was your last menstrual period?"     No  Protocols used: Sinus Pain or Congestion-A-AH

## 2022-10-07 NOTE — Progress Notes (Signed)
Patient ID: Zoe Alvarado, female    DOB: 30-Jan-1946, 77 y.o.   MRN: [id number]  PCP: [name], PA-C  Chief Complaint  Patient presents with   Nasal Congestion    Sinus pressure, nosebleed last night only, sx started since the weekend   Cough    Subjective:   Zoe Alvarado is a 77 y.o. female, presents to clinic with CC of the following:  HPI  Onset of sx this past weekend started with allergy type symptoms, sore throat, sneezing, congestion, some blood in nasal discharge when she blew her nose once Started to feel bad generally achy, chills, temp 99.8  She took tylenol  She is mostly concerned about being infectious to her grandson who she watches every Thursday She tested with home COVID test this am and then 2 days ago both negative  Cough mild, sometime productive sputum which is clear, cough is getting better, no pain with breathing, SOB, wheeze She had some body ache, chills, fatigue the first two days of sx but none  today and yesterday   Right knee pain, OA, previously recommended total knee replacement - she has been putting it off, now new popping in right knee, chronic pain with limp or sometimes swelling, the popping is new and not seeming to hurt her more than her baseline She wants to know about local ortho recommendations  Right forearm skintear - bumped arm into corner of cabinet She has been washing it, putting hydrogen peroxide on it daily, keeps it covered with a bandaid, the edges are scabbed and intact but the area is a little red and swollen - wants to know if it looks like its healing right - injury was less than a week ago    Patient Active Problem List   Diagnosis Date Noted   Typical atrial flutter (HCC) 07/11/2021   Secondary hypercoagulable state (HCC) 07/11/2021   Persistent atrial fibrillation (HCC)    Palpitations 03/13/2021   Cystocele with rectocele 05/05/2019   Post-menopause on HRT (hormone replacement therapy) 05/05/2019   OP  (osteoporosis) 01/15/2017   Arthritis, degenerative 01/15/2017   Climacteric 01/15/2017   Obesity (BMI 30.0-34.9) 05/02/2015   Hypertension 12/06/2014   Dyslipidemia 12/06/2014      Current Outpatient Medications:    acetaminophen (TYLENOL) 325 MG tablet, Take 650 mg by mouth as needed., Disp: , Rfl:    alendronate (FOSAMAX) 70 MG tablet, Take 70 mg by mouth every Tuesday. Take with a full glass of water on an empty stomach., Disp: , Rfl:    Calcium-Vitamin D-Vitamin K (VIACTIV CALCIUM PLUS D) 650-12.5-40 MG-MCG-MCG CHEW, Chew 2 each by mouth daily., Disp: , Rfl:    estradiol (ESTRACE) 0.5 MG tablet, Take 1 tablet (0.5 mg total) by mouth daily., Disp: 90 tablet, Rfl: 0   metoprolol succinate (TOPROL-XL) 25 MG 24 hr tablet, TAKE 1 TABLET BY MOUTH AT BEDTIME, Disp: 90 tablet, Rfl: 0   Multiple Vitamins-Minerals (CENTRUM MINIS WOMEN 50+) TABS, Take 1 tablet by mouth 2 (two) times daily., Disp: , Rfl:    Propylene Glycol (SYSTANE COMPLETE) 0.6 % SOLN, Place 1 drop into both eyes in the morning and at bedtime., Disp: , Rfl:    rivaroxaban (XARELTO) 20 MG TABS tablet, Take 1 tablet (20 mg total) by mouth daily with supper., Disp: 90 tablet, Rfl: 1   sodium chloride (OCEAN) 0.65 % SOLN nasal spray, Place 1 spray into both nostrils daily as needed for congestion., Disp: , Rfl:  Allergies  Allergen Reactions   Codeine Rash    On face and shoulders   Penicillins Rash    On face and should / in 1970     Social History   Tobacco Use   Smoking status: Never   Smokeless tobacco: Never  Vaping Use   Vaping Use: Never used  Substance Use Topics   Alcohol use: No    Alcohol/week: 0.0 standard drinks of alcohol   Drug use: No      Chart Review Today: I personally reviewed active problem list, medication list, allergies, family history, social history, health maintenance, notes from last encounter, lab results, imaging with the patient/caregiver today.   Review of Systems   Constitutional: Negative.  Negative for activity change, appetite change and unexpected weight change.  HENT:  Positive for congestion, postnasal drip, rhinorrhea, sneezing and sore throat. Negative for dental problem, drooling, ear discharge, ear pain, sinus pressure, sinus pain, tinnitus, trouble swallowing and voice change.   Eyes: Negative.   Respiratory: Negative.  Negative for apnea, choking, chest tightness, shortness of breath, wheezing and stridor.   Cardiovascular: Negative.  Negative for chest pain, palpitations and leg swelling.  Gastrointestinal: Negative.  Negative for abdominal pain, diarrhea, nausea and vomiting.  Endocrine: Negative.   Genitourinary: Negative.   Musculoskeletal: Negative.   Skin: Negative.   Allergic/Immunologic: Negative.   Neurological: Negative.   Hematological: Negative.   Psychiatric/Behavioral: Negative.    All other systems reviewed and are negative.      Objective:   Vitals:   10/07/22 0913  BP: 128/74  Pulse: 80  Resp: 16  Temp: 97.7 F (36.5 C)  TempSrc: Oral  SpO2: 98%  Weight: 132 lb (59.9 kg)  Height: 4\' 1"  (1.245 m)    Body mass index is 38.65 kg/m.  Physical Exam Vitals and nursing note reviewed.  Constitutional:      General: She is not in acute distress.    Appearance: Normal appearance. She is well-developed and normal weight. She is not ill-appearing, toxic-appearing or diaphoretic.  HENT:     Head: Normocephalic and atraumatic.     Right Ear: Hearing, tympanic membrane, ear canal and external ear normal. There is no impacted cerumen.     Left Ear: Hearing, tympanic membrane, ear canal and external ear normal. There is no impacted cerumen.     Nose: Mucosal edema, congestion and rhinorrhea present.     Right Sinus: No maxillary sinus tenderness or frontal sinus tenderness.     Left Sinus: No maxillary sinus tenderness or frontal sinus tenderness.     Mouth/Throat:     Mouth: Mucous membranes are moist. Mucous  membranes are not pale.     Pharynx: Oropharynx is clear. Uvula midline. Posterior oropharyngeal erythema present. No oropharyngeal exudate or uvula swelling.     Tonsils: No tonsillar abscesses.  Eyes:     General: No scleral icterus.       Right eye: No discharge.        Left eye: No discharge.     Conjunctiva/sclera: Conjunctivae normal.  Neck:     Trachea: No tracheal deviation.  Cardiovascular:     Rate and Rhythm: Normal rate and regular rhythm.     Pulses: Normal pulses.     Heart sounds: Normal heart sounds. No murmur heard.    No friction rub. No gallop.  Pulmonary:     Effort: Pulmonary effort is normal. No respiratory distress.     Breath sounds: Normal breath sounds. No  stridor. No wheezing, rhonchi or rales.  Abdominal:     General: Bowel sounds are normal. There is no distension.     Palpations: Abdomen is soft.  Musculoskeletal:     Cervical back: Normal range of motion and neck supple.     Right knee: Crepitus present. Decreased range of motion.  Skin:    General: Skin is warm and dry.     Coloration: Skin is not pale.     Findings: Wound (right forearm, roughly 1.2 cm V shaped wound, edges intact and slightly scabbed surrounding erythema and mild edema about 5mm) present. No rash.  Neurological:     Mental Status: She is alert. Mental status is at baseline.     Motor: No abnormal muscle tone.     Coordination: Coordination normal.     Gait: Gait abnormal.  Psychiatric:        Mood and Affect: Mood normal.        Behavior: Behavior normal.      Results for orders placed or performed in visit on 07/26/21  Lipid panel  Result Value Ref Range   Cholesterol, Total 175 100 - 199 mg/dL   Triglycerides 74 0 - 149 mg/dL   HDL 68 >16 mg/dL   VLDL Cholesterol Cal 14 5 - 40 mg/dL   LDL Chol Calc (NIH) 93 0 - 99 mg/dL   Chol/HDL Ratio 2.6 0.0 - 4.4 ratio       Assessment & Plan:     ICD-10-CM   1. Upper respiratory tract infection, unspecified type  J06.9     likely viral, onset of sx 4-5 d ago, improving, home covid test neg x 2, supportive and sx measures, can do otc tylenol, antihistamine, saline nose spray    2. Rhinosinusitis  J32.9 loratadine (CLARITIN) 10 MG tablet   encouraged nasal saline spray and trial of daily antihistamine, no sinus ttp, pt well appearing and sx generally improving, abx not indicated    3. Chronic pain of right knee  M25.561 Ambulatory referral to Orthopedics   G89.29    hx of severe OA recommended total knee replacement, she has chronic pain and limp, concerned with new popping/crepitus, refer to local ortho for consult    4. Pharyngitis, unspecified etiology  J02.9 POCT rapid strep A   OP injected w/o exudate, strep neg, likely viral - tx supportive    5. Skin tear of right forearm without complication, initial encounter  S51.811A    skin clean dry and intact, mild inflammation/erythema, doubt cellulitis, appears consistent with normal healing, some inflammation likely from hydrogen peroxide    6. Aortic atherosclerosis (HCC)  I70.0    not on statin, previously prescribed but pt refused/declined to take, she is seeing cardiology, she is overdue for lipids to be rechecked    7. Persistent atrial fibrillation (HCC)  I48.19    per cardiology, rate controlled on metoprolol and anticoagulated    Wound on right forearm - reviewed wound care - advised to stop use of hydrogen peroxide all together on any new or healing wounds - delays healing, can do warm clean soap and water over area when showering, no rubbing, pat dry, apply vaseline and cover with bandages to avoid bumping/getting dirty/infected.  With small skin tear/flap she she re-approximated at home I would expect healing over 2-3 weeks. She can f/up if any concerns, at this point does not appear infected - just slightly inflamed  URI/sinus sx - expect gradual improvement  She has been  using OTC nyquil - which she seems to tolerate ok - I have recommended that she  instead try tylenol prn, otc antihistamine at bedtime, saline nasal sprays or steroid nasal sprays, cough is improving - and I would expect it to improve more with decreasing post nasal drip - a lot of throat clearing  Unlikely to be infectious to other if fever free for more tan 24 hours w/o antipyretics Expect continued gradual improvement and she was encouraged to f/up if any acute/severe worsening  ortho referral entered - to est locally, she denies increased pain with popping and denies instability   F/up as needed if worsening  Pt is very overdue for routine f/up and MWV  - last regular OV was in 2022  [name], PA-C 10/07/22 9:50 AM

## 2022-10-16 ENCOUNTER — Other Ambulatory Visit: Payer: Self-pay | Admitting: Cardiology

## 2022-10-16 DIAGNOSIS — I4819 Other persistent atrial fibrillation: Secondary | ICD-10-CM

## 2022-10-16 NOTE — Telephone Encounter (Signed)
Prescription refill request for Xarelto received.  Indication:afib Last office visit:4/24 Weight:59.9  kg Age:77 Scr:0.6  10/23 CrCl:76.61  ml/min  Prescription refilled

## 2022-10-21 ENCOUNTER — Other Ambulatory Visit: Payer: Self-pay | Admitting: Obstetrics and Gynecology

## 2022-12-02 ENCOUNTER — Other Ambulatory Visit: Payer: Self-pay | Admitting: Family Medicine

## 2022-12-02 DIAGNOSIS — J329 Chronic sinusitis, unspecified: Secondary | ICD-10-CM

## 2022-12-12 ENCOUNTER — Other Ambulatory Visit: Payer: Self-pay

## 2022-12-12 MED ORDER — METOPROLOL SUCCINATE ER 25 MG PO TB24: 25.0000 mg | ORAL_TABLET | Freq: Every day | ORAL | 2 refills | Status: DC

## 2022-12-12 NOTE — Telephone Encounter (Signed)
Requested Prescriptions   Signed Prescriptions Disp Refills   metoprolol succinate (TOPROL-XL) 25 MG 24 hr tablet 90 tablet 2    Sig: Take 1 tablet (25 mg total) by mouth at bedtime.    Authorizing Provider: Lanier Prude    Ordering User: Guerry Minors

## 2023-01-07 ENCOUNTER — Ambulatory Visit: Payer: Medicare Other | Admitting: Family Medicine

## 2023-01-14 ENCOUNTER — Ambulatory Visit (INDEPENDENT_AMBULATORY_CARE_PROVIDER_SITE_OTHER): Payer: Medicare Other | Admitting: Family Medicine

## 2023-01-14 ENCOUNTER — Encounter: Payer: Self-pay | Admitting: Family Medicine

## 2023-01-14 VITALS — BP 114/66 | HR 79 | Temp 98.1°F | Resp 16 | Ht 59.0 in | Wt 131.3 lb

## 2023-01-14 DIAGNOSIS — I10 Essential (primary) hypertension: Secondary | ICD-10-CM

## 2023-01-14 DIAGNOSIS — Z1159 Encounter for screening for other viral diseases: Secondary | ICD-10-CM | POA: Diagnosis not present

## 2023-01-14 DIAGNOSIS — Z5181 Encounter for therapeutic drug level monitoring: Secondary | ICD-10-CM

## 2023-01-14 DIAGNOSIS — E785 Hyperlipidemia, unspecified: Secondary | ICD-10-CM

## 2023-01-14 DIAGNOSIS — H1032 Unspecified acute conjunctivitis, left eye: Secondary | ICD-10-CM

## 2023-01-14 MED ORDER — CROMOLYN SODIUM 4 % OP SOLN
1.0000 [drp] | Freq: Four times a day (QID) | OPHTHALMIC | 2 refills | Status: DC | PRN
Start: 2023-01-14 — End: 2023-10-13

## 2023-01-14 NOTE — Progress Notes (Signed)
Name: Zoe Alvarado   MRN: [id number]    DOB: 12/20/1945   Date:01/14/2023       Progress Note  Chief Complaint  Patient presents with   Follow-up   Hypertension   Hyperlipidemia     Subjective:   Zoe Alvarado is a 77 y.o. female, presents to clinic for routine f/up on chronic conditions  Afib/HTN managed by cardiology Currently managed on metoprolol and xarelto Pt reports good med compliance and denies any SE.   Blood pressure today is well controlled. BP Readings from Last 3 Encounters:  01/14/23 114/66  10/07/22 128/74  09/12/22 126/74   Pulse Readings from Last 3 Encounters:  01/14/23 79  10/07/22 80  09/12/22 76   Pt denies CP, SOB, exertional sx, LE edema, palpitation, Ha's, visual disturbances, lightheadedness, hypotension, syncope.  Osteoporosis on fosamax with endo - bone density and labs reviewed through care everywhere   Hyperlipidemia: Currently treated with diet ASCVD score high in the past, lipids are not terribly elevated Last Lipids: Lab Results  Component Value Date   CHOL 175 07/26/2021   HDL 68 07/26/2021   LDLCALC 93 07/26/2021   TRIG 74 07/26/2021   CHOLHDL 2.6 07/26/2021   - Denies: Chest pain, shortness of breath, myalgias, claudication    The 10-year ASCVD risk score (Arnett DK, et al., 2019) is: 16.9%   Values used to calculate the score:     Age: 69 years     Sex: Female     Is Non-Hispanic African American: No     Diabetic: No     Tobacco smoker: No     Systolic Blood Pressure: 114 mmHg     Is BP treated: Yes     HDL Cholesterol: 68 mg/dL     Total Cholesterol: 175 mg/dL     Current Outpatient Medications:    acetaminophen (TYLENOL) 325 MG tablet, Take 650 mg by mouth as needed., Disp: , Rfl:    alendronate (FOSAMAX) 70 MG tablet, Take 70 mg by mouth every Tuesday. Take with a full glass of water on an empty stomach., Disp: , Rfl:    Calcium-Vitamin D-Vitamin K (VIACTIV CALCIUM PLUS D) 650-12.5-40 MG-MCG-MCG CHEW, Chew 2  each by mouth daily., Disp: , Rfl:    estradiol (ESTRACE) 0.5 MG tablet, Take 1 tablet (0.5 mg total) by mouth daily., Disp: 90 tablet, Rfl: 3   loratadine (CLARITIN) 10 MG tablet, TAKE 1 TABLET BY MOUTH AT BEDTIME, Disp: 30 tablet, Rfl: 0   metoprolol succinate (TOPROL-XL) 25 MG 24 hr tablet, Take 1 tablet (25 mg total) by mouth at bedtime., Disp: 90 tablet, Rfl: 2   Multiple Vitamins-Minerals (CENTRUM MINIS WOMEN 50+) TABS, Take 1 tablet by mouth 2 (two) times daily., Disp: , Rfl:    Propylene Glycol (SYSTANE COMPLETE) 0.6 % SOLN, Place 1 drop into both eyes in the morning and at bedtime., Disp: , Rfl:    sodium chloride (OCEAN) 0.65 % SOLN nasal spray, Place 1 spray into both nostrils daily as needed for congestion., Disp: , Rfl:    XARELTO 20 MG TABS tablet, TAKE 1 TABLET BY MOUTH DAILY  WITH SUPPER, Disp: 90 tablet, Rfl: 3  Patient Active Problem List   Diagnosis Date Noted   Aortic atherosclerosis (HCC) 10/07/2022   Typical atrial flutter (HCC) 07/11/2021   Secondary hypercoagulable state (HCC) 07/11/2021   Persistent atrial fibrillation (HCC)    Palpitations 03/13/2021   Cystocele with rectocele 05/05/2019   Post-menopause on HRT (hormone replacement  therapy) 05/05/2019   OP (osteoporosis) 01/15/2017   Arthritis, degenerative 01/15/2017   Climacteric 01/15/2017   Obesity (BMI 30.0-34.9) 05/02/2015   Hypertension 12/06/2014   Dyslipidemia 12/06/2014    Past Surgical History:  Procedure Laterality Date   ABDOMINAL HYSTERECTOMY     ATRIAL FIBRILLATION ABLATION N/A 06/13/2021   Procedure: ATRIAL FIBRILLATION ABLATION;  Surgeon: Lanier Prude, MD;  Location: [facility];  Service: Cardiovascular;  Laterality: N/A;   CARDIOVERSION N/A 04/24/2021   Procedure: CARDIOVERSION;  Surgeon: Debbe Odea, MD;  Location: [facility];  Service: Cardiovascular;  Laterality: N/A;   CATARACT EXTRACTION W/PHACO Left 11/24/2019   Procedure: CATARACT EXTRACTION PHACO AND INTRAOCULAR LENS  PLACEMENT (IOC) LEFT [name];  Surgeon: Lockie Mola, MD;  Location: [facility];  Service: Ophthalmology;  Laterality: Left;  cde 16.31 [place] 1:39 ap 16.5    CATARACT EXTRACTION W/PHACO Right 12/14/2019   Procedure: CATARACT EXTRACTION PHACO AND INTRAOCULAR LENS PLACEMENT (IOC) RIGHT EYHANCE TORIC 3.77 00:41.5 901%;  Surgeon: Lockie Mola, MD;  Location: [facility];  Service: Ophthalmology;  Laterality: Right;   CHOLECYSTECTOMY     DENTAL SURGERY     dental implants   DILATION AND CURETTAGE OF UTERUS     KNEE SURGERY     arthroscopy   TONSILLECTOMY      Family History  Problem Relation Age of Onset   Diabetes Mother    Heart attack Father    Heart disease Father    Hypertension Father    Prostate cancer Father    Cancer Father    Vision loss Father    Diabetes Brother    Breast cancer Maternal Aunt 40   Cancer Maternal Aunt    Breast cancer Maternal Grandmother    Cancer Maternal Grandmother    Ovarian cancer Neg Hx    Colon cancer Neg Hx     Social History   Tobacco Use   Smoking status: Never   Smokeless tobacco: Never  Vaping Use   Vaping status: Never Used  Substance Use Topics   Alcohol use: No    Alcohol/week: 0.0 standard drinks of alcohol   Drug use: No     Allergies  Allergen Reactions   Codeine Rash    On face and shoulders   Penicillins Rash    On face and should / in 1970    Health Maintenance  Topic Date Due   Hepatitis C Screening  Never done   Medicare Annual Wellness (AWV)  05/16/2020   INFLUENZA VACCINE  12/18/2022   Colonoscopy  02/21/2023   COVID-19 Vaccine (5 - 2023-24 season) 01/30/2023 (Originally 01/17/2022)   Zoster Vaccines- Shingrix (1 of 2) 04/16/2023 (Originally 07/14/1997)   DTaP/Tdap/Td (2 - Td or Tdap) 04/23/2023   Pneumonia Vaccine 41+ Years old  Completed   DEXA SCAN  Completed   HPV VACCINES  Aged Out    Chart Review Today: I personally reviewed active problem list, medication list, allergies,  family history, social history, health maintenance, notes from last encounter, lab results, imaging with the patient/caregiver today.   Review of Systems  Constitutional: Negative.   HENT: Negative.    Eyes: Negative.   Respiratory: Negative.    Cardiovascular: Negative.   Gastrointestinal: Negative.   Endocrine: Negative.   Genitourinary: Negative.   Musculoskeletal: Negative.   Skin: Negative.   Allergic/Immunologic: Negative.   Neurological: Negative.   Hematological: Negative.   Psychiatric/Behavioral: Negative.    All other systems reviewed and are negative.  Objective:   Vitals:   01/14/23 0850  BP: 114/66  Pulse: 79  Resp: 16  Temp: 98.1 F (36.7 C)  TempSrc: Oral  SpO2: 98%  Weight: 131 lb 4.8 oz (59.6 kg)  Height: 4\' 11"  (1.499 m)    Body mass index is 26.52 kg/m.  Physical Exam Vitals and nursing note reviewed.  Constitutional:      General: She is not in acute distress.    Appearance: Normal appearance. She is well-developed and normal weight. She is not ill-appearing, toxic-appearing or diaphoretic.     Comments: Elderly, well appearing, looks stated age  HENT:     Head: Normocephalic and atraumatic.     Nose: Nose normal.  Eyes:     General:        Right eye: No discharge.        Left eye: No discharge.     Conjunctiva/sclera: Conjunctivae normal.  Neck:     Trachea: No tracheal deviation.  Cardiovascular:     Rate and Rhythm: Normal rate and regular rhythm.     Pulses: Normal pulses.     Heart sounds: Normal heart sounds. No murmur heard.    No friction rub. No gallop.  Pulmonary:     Effort: Pulmonary effort is normal. No respiratory distress.     Breath sounds: No stridor.  Musculoskeletal:        General: Normal range of motion.  Skin:    General: Skin is warm and dry.     Findings: No rash.  Neurological:     Mental Status: She is alert.     Motor: No abnormal muscle tone.     Coordination: Coordination normal.  Psychiatric:         Behavior: Behavior normal.         Assessment & Plan:   1. Primary hypertension BP stable and well controlled, rate controlled with metoprolol for Afib, HR and BP in normal range today Meds managed by cardiology - COMPLETE METABOLIC PANEL WITH GFR  2. Dyslipidemia Prior lipids and ASCVD risk scores reviewed, in 2020 pt was recommended to start statin, lipids last year were in normal range but ASCVD risk calculator showing >16% reviewed and discussed indication and benefits of medications Will do labs, recalculate ascvd score and possibly consult with her cardiologist - COMPLETE METABOLIC PANEL WITH GFR - Lipid panel  3. Encounter for medication monitoring  - COMPLETE METABOLIC PANEL WITH GFR - Lipid panel - CBC with Differential/Platelet  4. Encounter for hepatitis C screening test for low risk patient  - Hepatitis C antibody  5. Acute conjunctivitis of left eye, unspecified acute conjunctivitis type Allergic, recurrent sx, cromolyn was very effective - cromolyn (OPTICROM) 4 % ophthalmic solution; Place 1 drop into both eyes 4 (four) times daily as needed.  Dispense: 10 mL; Refill: 2      [name], PA-C 01/14/23 9:13 AM

## 2023-01-14 NOTE — Patient Instructions (Signed)
We will let you know about the cholesterol levels and the ASCVD risk assessment again and I will try to coordinate

## 2023-01-15 LAB — COMPLETE METABOLIC PANEL WITH GFR
AG Ratio: 1.5 (calc) (ref 1.0–2.5)
ALT: 13 U/L (ref 6–29)
AST: 18 U/L (ref 10–35)
Albumin: 4.1 g/dL (ref 3.6–5.1)
Alkaline phosphatase (APISO): 91 U/L (ref 37–153)
BUN/Creatinine Ratio: 16 (calc) (ref 6–22)
BUN: 9 mg/dL (ref 7–25)
CO2: 28 mmol/L (ref 20–32)
Calcium: 9.2 mg/dL (ref 8.6–10.4)
Chloride: 106 mmol/L (ref 98–110)
Creat: 0.58 mg/dL — ABNORMAL LOW (ref 0.60–1.00)
Globulin: 2.7 g/dL (calc) (ref 1.9–3.7)
Glucose, Bld: 82 mg/dL (ref 65–99)
Potassium: 4.5 mmol/L (ref 3.5–5.3)
Sodium: 140 mmol/L (ref 135–146)
Total Bilirubin: 0.8 mg/dL (ref 0.2–1.2)
Total Protein: 6.8 g/dL (ref 6.1–8.1)
eGFR: 94 mL/min/{1.73_m2} (ref >=60)

## 2023-01-15 LAB — LIPID PANEL
Cholesterol: 182 mg/dL (ref <200)
HDL: 65 mg/dL (ref >=50)
LDL Cholesterol (Calc): 98 mg/dL
Non-HDL Cholesterol (Calc): 117 mg/dL (ref <130)
Total CHOL/HDL Ratio: 2.8 (calc) (ref <5.0)
Triglycerides: 92 mg/dL (ref <150)

## 2023-01-15 LAB — CBC WITH DIFFERENTIAL/PLATELET
Absolute Monocytes: 386 {cells}/uL (ref 200–950)
Basophils Absolute: 50 {cells}/uL (ref 0–200)
Basophils Relative: 0.9 %
Eosinophils Absolute: 67 {cells}/uL (ref 15–500)
Eosinophils Relative: 1.2 %
HCT: 39.8 % (ref 35.0–45.0)
Hemoglobin: 13.6 g/dL (ref 11.7–15.5)
Lymphs Abs: 1193 {cells}/uL (ref 850–3900)
MCH: 30 pg (ref 27.0–33.0)
MCHC: 34.2 g/dL (ref 32.0–36.0)
MCV: 87.9 fL (ref 80.0–100.0)
MPV: 10 fL (ref 7.5–12.5)
Monocytes Relative: 6.9 %
Neutro Abs: 3903 {cells}/uL (ref 1500–7800)
Neutrophils Relative %: 69.7 %
Platelets: 227 10*3/uL (ref 140–400)
RBC: 4.53 10*6/uL (ref 3.80–5.10)
RDW: 12.3 % (ref 11.0–15.0)
Total Lymphocyte: 21.3 %
WBC: 5.6 10*3/uL (ref 3.8–10.8)

## 2023-01-15 LAB — HEPATITIS C ANTIBODY: Hepatitis C Ab: NONREACTIVE

## 2023-04-20 ENCOUNTER — Encounter: Payer: Self-pay | Admitting: Family Medicine

## 2023-04-20 ENCOUNTER — Other Ambulatory Visit: Payer: Self-pay | Admitting: Family Medicine

## 2023-04-20 DIAGNOSIS — Z1231 Encounter for screening mammogram for malignant neoplasm of breast: Secondary | ICD-10-CM

## 2023-05-19 ENCOUNTER — Ambulatory Visit
Admission: RE | Admit: 2023-05-19 | Discharge: 2023-05-19 | Disposition: A | Discharge status: Home / Self Care | Payer: Medicare Other | Source: Ambulatory Visit | Attending: Family Medicine | Admitting: Family Medicine

## 2023-05-19 DIAGNOSIS — Z1231 Encounter for screening mammogram for malignant neoplasm of breast: Secondary | ICD-10-CM | POA: Insufficient documentation

## 2023-06-17 ENCOUNTER — Ambulatory Visit (INDEPENDENT_AMBULATORY_CARE_PROVIDER_SITE_OTHER): Payer: Medicare Other

## 2023-06-17 DIAGNOSIS — Z23 Encounter for immunization: Secondary | ICD-10-CM | POA: Diagnosis not present

## 2023-08-23 IMAGING — CT CT HEART MORPH/PULM VEIN W/ CM & W/O CA SCORE
1 of 8 series · 11 of 20 positions shown, 14 images · non-contrast
Comparison: None.
COMPARISON: None.

Addendum:
EXAM:
OVER-READ INTERPRETATION  CT CHEST

The following report is an over-read performed by radiologist Dr.
Gabinet Kosmetyczny Katra [REDACTED] on 06/03/2021. This over-read
does not include interpretation of cardiac or coronary anatomy or
pathology. The coronary CTA interpretation by the cardiologist is
attached.
CLINICAL DATA: Atrial fibrillation scheduled for an ablation.
Cardiac CT/CTA
TECHNIQUE: The patient was scanned on a Siemens Somatom scanner.

[Series 25: multiphase % pulmonary vein 0.60 · axial · 0.34mm/px · z∈[-1114,-1014]mm · 11 of 2114 slices shown, 14 images]
[im 177/2114  vessel]
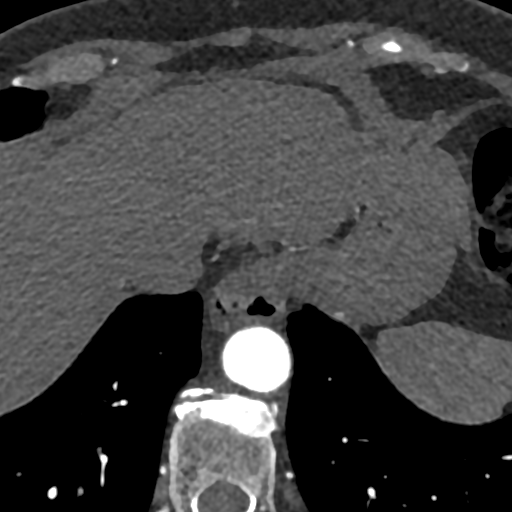
[im 177/2114  lung]
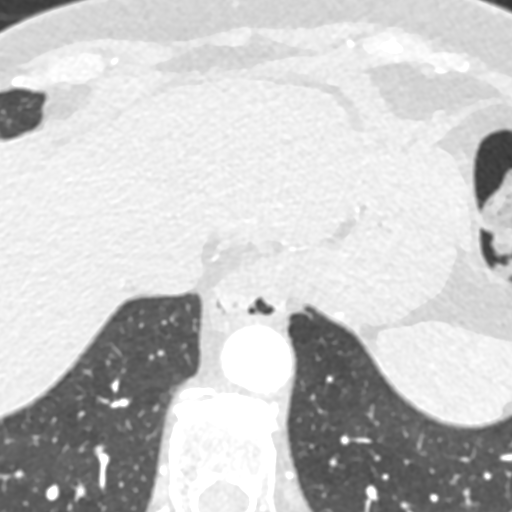
[im 353/2114  vessel]
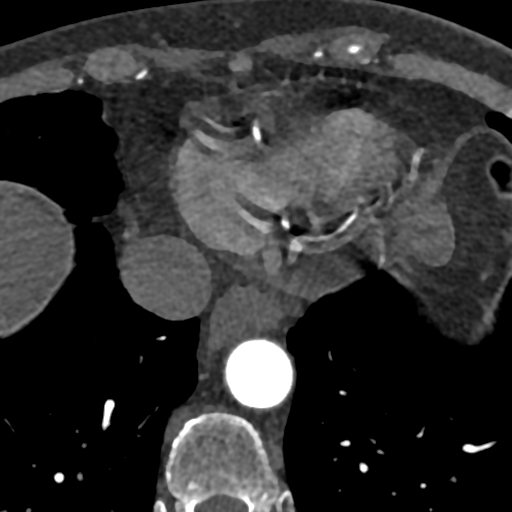
[im 529/2114  vessel]
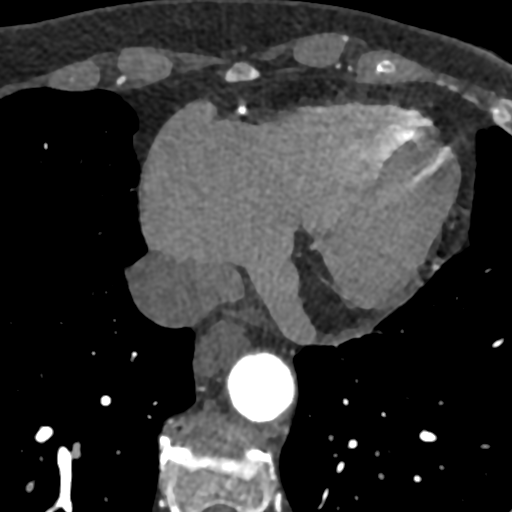
[im 705/2114  vessel]
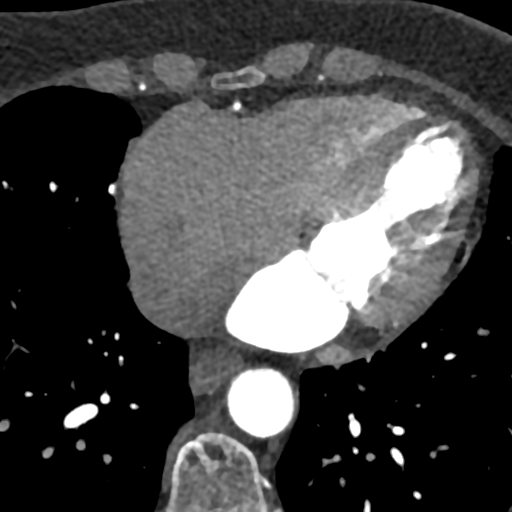
[im 881/2114  vessel]
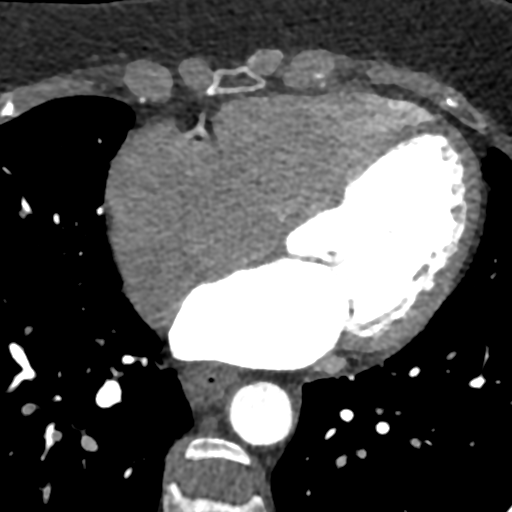
[im 881/2114  lung]
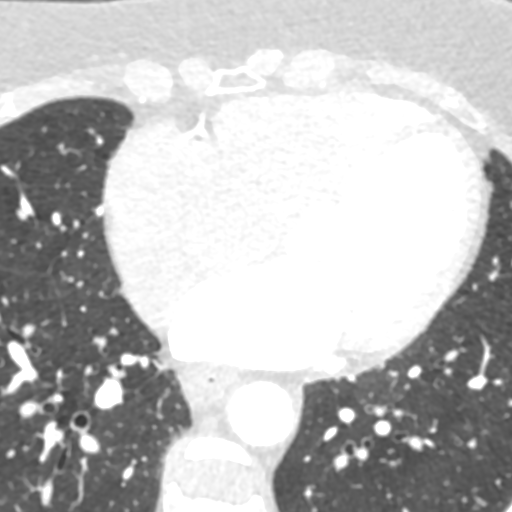
[im 1057/2114  vessel]
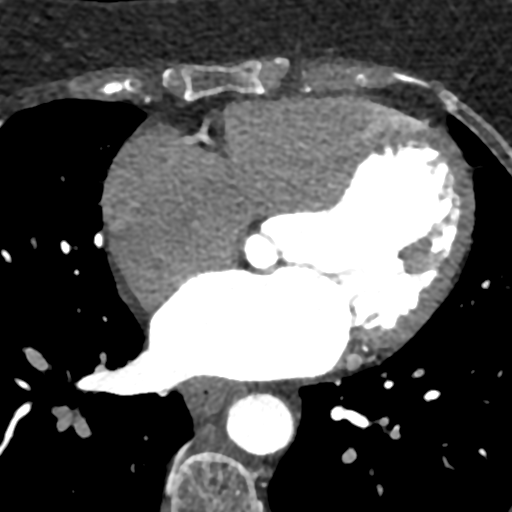
[im 1233/2114  vessel]
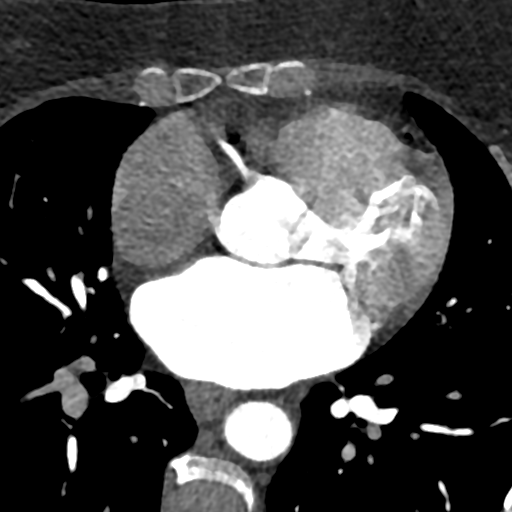
[im 1409/2114  vessel]
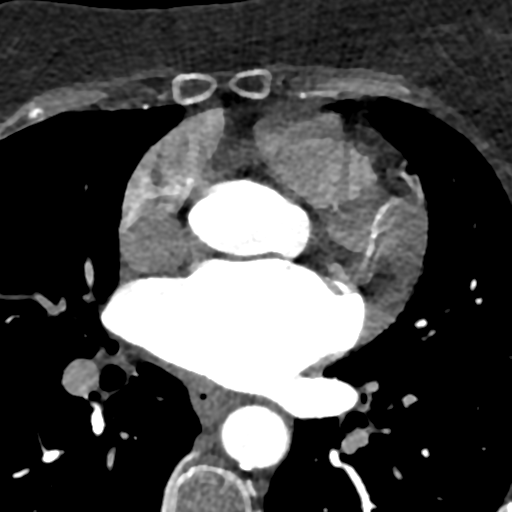
[im 1585/2114  vessel]
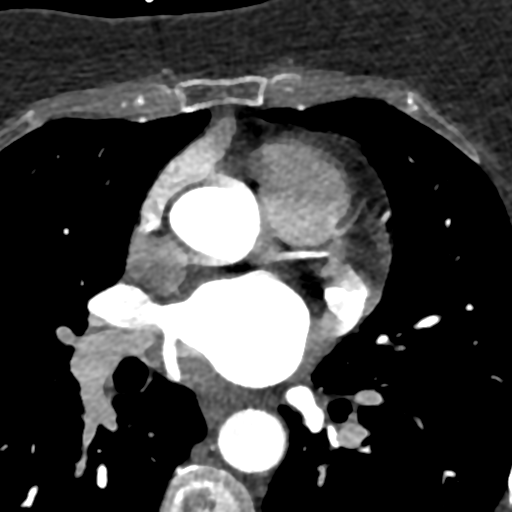
[im 1585/2114  lung]
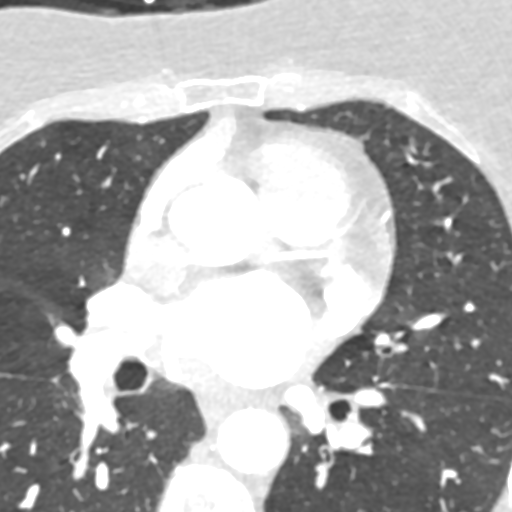
[im 1761/2114  vessel]
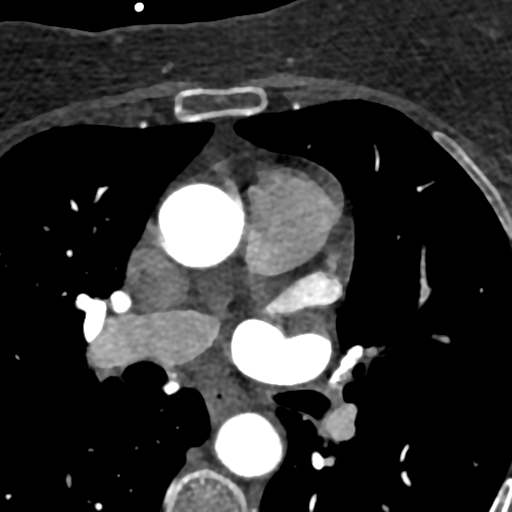
[im 1937/2114  vessel]
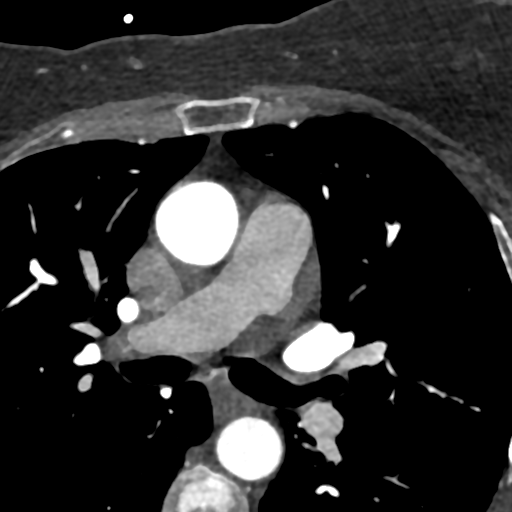

[11 of 20 positions shown; findings below may reference images not displayed]

FINDINGS: Vascular: Aortic atherosclerosis. No central pulmonary embolism, on
this non-dedicated study.

Mediastinum/Nodes: No imaged thoracic adenopathy. Tiny hiatal
hernia.

Lungs/Pleura: No pleural fluid. Right lower and left upper lobe
calcified granulomas

Upper Abdomen: Normal imaged portions of the liver, spleen, stomach.

Musculoskeletal: No acute osseous abnormality.
IMPRESSION: No acute findings in the imaged extracardiac chest.

Aortic Atherosclerosis (X9JDG-YES.S).

Tiny hiatal hernia.
FINDINGS: A 120 kV prospective scan was triggered in the descending thoracic
aorta at 111 HU's. Gantry rotation speed was 330 msecs and
collimation was .9 mm. No beta blockade and no NTG was given. The 3D
data set was reconstructed in 5% intervals of the 60-80 % of the R-R
cycle. Diastolic phases were analyzed on a dedicated work station
using MPR, MIP and VRT modes. The patient received 80 cc of
contrast.

There is normal pulmonary vein drainage into the left atrium (3 on
the right and 2 on the left) with ostial measurements as follows:

RUPV: 14 x 13 mm, Area 14 mm2

RMPV: 11 x 10 mm, Area 8 mm2

RLPV: 19 x 14 mm, Area 20 mm2

LUPV: 17 x 16 mm, Area 20 mm2

LLPV: 17 x 12 mm, Area 16 mm2

The left atrial appendage is a Windsock type with ostial size 16 x
12 mm and length 21 mm, Area 15 mm2. There is no thrombus in the
left atrial appendage.

The esophagus runs in the left atrial midline and is not in the
proximity to any of the pulmonary veins.

Aorta:  Normal caliber.  No dissection or calcifications.

Aortic Valve:  Trileaflet.  No calcifications.

Coronary Arteries: Normal coronary origin. Right dominance. The
study was performed without use of NTG and insufficient for plaque
evaluation.
IMPRESSION: 1. There is normal pulmonary vein drainage into the left atrium. 3
on the right and 2 on the left.

2. The left atrial appendage is a Windsock type with ostial size 16
x 12 mm and length 21 mm, Area 15 mm2. There is no thrombus in the
left atrial appendage.

3. The esophagus runs in the left atrial midline and is not in the
proximity to any of the pulmonary veins.

Heimen Buenhombre

*** End of Addendum ***
EXAM:
OVER-READ INTERPRETATION  CT CHEST

The following report is an over-read performed by radiologist Dr.
Gabinet Kosmetyczny Katra [REDACTED] on 06/03/2021. This over-read
does not include interpretation of cardiac or coronary anatomy or
pathology. The coronary CTA interpretation by the cardiologist is
attached.
FINDINGS: Vascular: Aortic atherosclerosis. No central pulmonary embolism, on
this non-dedicated study.

Mediastinum/Nodes: No imaged thoracic adenopathy. Tiny hiatal
hernia.

Lungs/Pleura: No pleural fluid. Right lower and left upper lobe
calcified granulomas

Upper Abdomen: Normal imaged portions of the liver, spleen, stomach.

Musculoskeletal: No acute osseous abnormality.
IMPRESSION: No acute findings in the imaged extracardiac chest.

Aortic Atherosclerosis (X9JDG-YES.S).

Tiny hiatal hernia.

## 2023-09-03 ENCOUNTER — Other Ambulatory Visit: Payer: Self-pay | Admitting: Cardiology

## 2023-09-16 ENCOUNTER — Encounter: Payer: Self-pay | Admitting: Nurse Practitioner

## 2023-09-16 ENCOUNTER — Ambulatory Visit: Payer: Self-pay | Attending: Nurse Practitioner | Admitting: Nurse Practitioner

## 2023-09-16 VITALS — BP 128/72 | HR 79 | Ht 59.0 in | Wt 129.8 lb

## 2023-09-16 DIAGNOSIS — I4819 Other persistent atrial fibrillation: Secondary | ICD-10-CM

## 2023-09-16 DIAGNOSIS — I10 Essential (primary) hypertension: Secondary | ICD-10-CM

## 2023-09-16 NOTE — Progress Notes (Signed)
 Office Visit    Patient Name: Zoe Alvarado Date of Encounter: 09/16/2023  Primary Care Provider:  Adeline Hone, PA-C Primary Cardiologist:  Constancia Delton, MD/EP: Rodolph Clap, MD  Chief Complaint    78 y.o. female w/ a h/o persistent Afib s/p catheter ablation in 05/2021 and HTN, who presents for A-fib follow-up.  Past Medical History  Subjective   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   Cataract years   History of echocardiogram    a. 03/2021 Echo: EF 50-55%, no rwma, nl RV fxn, RVSP 25.80mmHg. Mildly dil LA. Mild MR, mild-mod TR.   Hyperlipidemia    Hypertension    Osteoporosis    Persistent atrial fibrillation (HCC)    a. 04/2021 s/p DCCV; b. 05/2021 s/p PVI/cathteter ablation; c. Chronic eliquis - CHA2DS2VASc = 4.   Pneumonia    Prolapse of female pelvic organs    Past Surgical History:  Procedure Laterality Date   ABDOMINAL HYSTERECTOMY     ATRIAL FIBRILLATION ABLATION N/A 06/13/2021   Procedure: ATRIAL FIBRILLATION ABLATION;  Surgeon: Boyce Byes, MD;  Location: [facility];  Service: Cardiovascular;  Laterality: N/A;   CARDIOVERSION N/A 04/24/2021   Procedure: CARDIOVERSION;  Surgeon: Constancia Delton, MD;  Location: [facility];  Service: Cardiovascular;  Laterality: N/A;   CATARACT EXTRACTION W/PHACO Left 11/24/2019   Procedure: CATARACT EXTRACTION PHACO AND INTRAOCULAR LENS PLACEMENT (IOC) LEFT Melecio Sports;  Surgeon: Annell Kidney, MD;  Location: [facility];  Service: Ophthalmology;  Laterality: Left;  cde 16.31 [place]  1:39 ap 16.5    CATARACT EXTRACTION W/PHACO Right 12/14/2019   Procedure: CATARACT EXTRACTION PHACO AND INTRAOCULAR LENS PLACEMENT (IOC) RIGHT EYHANCE TORIC 3.77 00:41.5 901%;  Surgeon: Annell Kidney, MD;  Location: [facility];  Service: Ophthalmology;  Laterality: Right;   CHOLECYSTECTOMY     DENTAL SURGERY      dental implants   DILATION AND CURETTAGE OF UTERUS     KNEE SURGERY     arthroscopy   TONSILLECTOMY      Allergies  Allergies  Allergen Reactions   Codeine Rash    On face and shoulders   Penicillins Rash    On face and should / in 1970      History of Present Illness      78 y.o. y/o female w/ a h/o persistent Afib s/p catheter ablation in 05/2021 and HTN.  She previously underwent cardioversion in December 2022.  Echocardiogram at that time showed low normal LV function with an EF of 50-55% and mildly dilated left atrium.  She was subsequently seen by electrophysiology and underwent successful A-fib ablation in January 2023.   [name] was last seen in cardiology clinic in April 2024, at which time she was doing well and maintaining sinus rhythm on beta-blocker therapy.  She has been chronically anticoagulated with Xarelto .  Over the past year, she has done well.  She remains active, going to the gym about twice a week and otherwise just being active in and around her home and with visiting friends.  She does not experience palpitations, chest pain, dyspnea, PND, orthopnea, syncope, edema, or early satiety.  She did have a brief episode of vertiginous symptoms in October 2024 after traveling to [place] .  These resolved after Epley maneuvers.  She has noted some progression of lower extremity varicose veins without significant edema.  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  She is potentially interested in coming off of Xarelto  at some point due to cost. Objective  Home Medications    Current Outpatient Medications  Medication Sig Dispense Refill   acetaminophen  (TYLENOL ) 325 MG tablet Take 650 mg by mouth as needed.     alendronate (FOSAMAX) 70 MG tablet Take 70 mg by mouth every Tuesday. Take with a full glass of water on an empty stomach.     Calcium -Vitamin D -Vitamin K (VIACTIV CALCIUM  PLUS D) 650-12.5-40 MG-MCG-MCG CHEW Chew 2 each by mouth daily.     estradiol  (ESTRACE ) 0.5 MG  tablet Take 1 tablet (0.5 mg total) by mouth daily. 90 tablet 3   loratadine  (CLARITIN ) 10 MG tablet TAKE 1 TABLET BY MOUTH AT BEDTIME (Patient taking differently: Take 10 mg by mouth daily as needed for allergies.) 30 tablet 0   metoprolol  succinate (TOPROL -XL) 25 MG 24 hr tablet TAKE 1 TABLET BY MOUTH AT BEDTIME 90 tablet 0   Multiple Vitamins-Minerals (CENTRUM MINIS WOMEN 50+) TABS Take 1 tablet by mouth 2 (two) times daily.     Propylene Glycol (SYSTANE COMPLETE) 0.6 % SOLN Place 1 drop into both eyes in the morning and at bedtime.     sodium chloride  (OCEAN) 0.65 % SOLN nasal spray Place 1 spray into both nostrils daily as needed for congestion.     XARELTO  20 MG TABS tablet TAKE 1 TABLET BY MOUTH DAILY  WITH SUPPER 90 tablet 3   cromolyn  (OPTICROM ) 4 % ophthalmic solution Place 1 drop into both eyes 4 (four) times daily as needed. 10 mL 2   No current facility-administered medications for this visit.     Physical Exam    VS:  BP 128/72   Pulse 79   Ht 4\' 11"  (1.499 m)   Wt 129 lb 12.8 oz (58.9 kg)   SpO2 98%   BMI 26.22 kg/m  , BMI Body mass index is 26.22 kg/m.       GEN: Well nourished, well developed, in no acute distress. HEENT: normal. Neck: Supple, no JVD, carotid bruits, or masses. Cardiac: RRR, no murmurs, rubs, or gallops. No clubbing, cyanosis, edema.  Radials 2+/PT 2+ and equal bilaterally.  Respiratory:  Respirations regular and unlabored, clear to auscultation bilaterally. GI: Soft, nontender, nondistended, BS + x 4. MS: no deformity or atrophy. Skin: warm and dry, no rash. Neuro:  Strength and sensation are intact. Psych: Normal affect.  Accessory Clinical Findings    ECG personally reviewed by me today - EKG Interpretation Date/Time:  Wednesday September 16 2023 10:08:47 EDT Ventricular Rate:  79 PR Interval:  188 QRS Duration:  72 QT Interval:  374 QTC Calculation: 428 R Axis:   -23  Text Interpretation: Normal sinus rhythm Normal ECG Confirmed by  Laneta Pintos [phone]) on 09/16/2023 10:18:20 AM   - no acute changes.  Lab Results  Component Value Date   WBC 5.6 01/14/2023   HGB 13.6 01/14/2023   HCT 39.8 01/14/2023   MCV 87.9 01/14/2023   PLT 227 01/14/2023   Lab Results  Component Value Date   CREATININE 0.58 (L) 01/14/2023   BUN 9 01/14/2023   NA 140 01/14/2023   K 4.5 01/14/2023   CL 106 01/14/2023   CO2 28 01/14/2023   Lab Results  Component Value Date   ALT 13 01/14/2023   AST 18 01/14/2023   ALKPHOS 106 08/03/2015   BILITOT 0.8 01/14/2023   Lab Results  Component Value  Date   CHOL 182 01/14/2023   HDL 65 01/14/2023   LDLCALC 98 01/14/2023   TRIG 92 01/14/2023   CHOLHDL 2.8 01/14/2023    Lab Results  Component Value Date   HGBA1C 4.6 03/13/2021   Lab Results  Component Value Date   TSH 1.54 03/13/2021       Assessment & Plan    1.  Persistent atrial fibrillation: Diagnosed in December 2022.  Status post cardioversion at that time followed by catheter ablation in January 2023.  She has done well over the past year without any known recurrence of atrial fibrillation.  She periodically checks her heart rhythm on a Kardia mobile device.  Strips reviewed today.  Lots of artifact but overall, she has been in sinus rhythm on the occasions that she checks.  She has some interest in coming off of Xarelto , if possible, though isn't currently interested in loop monitoring but will reconsider over the next year.  Cont ? blocker.  2.  Primary HTN:  BP stable.  Cont ? blocker.  3.  Disposition:  f/u in 1 yr.   Laneta Pintos, NP 09/16/2023, 12:45 PM

## 2023-09-16 NOTE — Patient Instructions (Signed)
 Medication Instructions:  No changes *If you need a refill on your cardiac medications before your next appointment, please call your pharmacy*  Lab Work: None ordered If you have labs (blood work) drawn today and your tests are completely normal, you will receive your results only by: MyChart Message (if you have MyChart) OR A paper copy in the mail If you have any lab test that is abnormal or we need to change your treatment, we will call you to review the results.  Testing/Procedures: None ordered  Follow-Up: At [facility], you and your health needs are our priority.  As part of our continuing mission to provide you with exceptional heart care, our providers are all part of one team.  This team includes your primary Cardiologist (physician) and Advanced Practice Providers or APPs (Physician Assistants and Nurse Practitioners) who all work together to provide you with the care you need, when you need it.  Your next appointment:   12 month(s)  Provider:   Harvie Liner, MD    We recommend signing up for the patient portal called "MyChart".  Sign up information is provided on this After Visit Summary.  MyChart is used to connect with patients for Virtual Visits (Telemedicine).  Patients are able to view lab/test results, encounter notes, upcoming appointments, etc.  Non-urgent messages can be sent to your provider as well.   To learn more about what you can do with MyChart, go to [email or web address].

## 2023-10-12 ENCOUNTER — Other Ambulatory Visit: Payer: Self-pay

## 2023-10-12 ENCOUNTER — Ambulatory Visit
Admission: EM | Admit: 2023-10-12 | Discharge: 2023-10-12 | Disposition: A | Discharge status: Home / Self Care | Attending: Emergency Medicine | Admitting: Emergency Medicine

## 2023-10-12 ENCOUNTER — Encounter: Payer: Self-pay | Admitting: Emergency Medicine

## 2023-10-12 ENCOUNTER — Ambulatory Visit (INDEPENDENT_AMBULATORY_CARE_PROVIDER_SITE_OTHER)

## 2023-10-12 DIAGNOSIS — R058 Other specified cough: Secondary | ICD-10-CM

## 2023-10-12 DIAGNOSIS — R9389 Abnormal findings on diagnostic imaging of other specified body structures: Secondary | ICD-10-CM

## 2023-10-12 DIAGNOSIS — R042 Hemoptysis: Secondary | ICD-10-CM

## 2023-10-12 MED ORDER — DOXYCYCLINE HYCLATE 100 MG PO CAPS: 100.0000 mg | ORAL_CAPSULE | Freq: Two times a day (BID) | ORAL | 0 refills | Status: AC

## 2023-10-12 NOTE — Discharge Instructions (Addendum)
 Your chest xray shows possible COPD.  Please follow up with your primary care provider about this.    Take the doxycycline as directed.    Follow up with your primary care provider tomorrow.  Go to the emergency department if you have worsening symptoms.

## 2023-10-12 NOTE — ED Triage Notes (Addendum)
 Cough, sore throat and fever for one week.  Highest temp 99.9.  coughing up yellowish phlegm.  Coughing up blood tinged phlegm and when blowing nose, secretions may be blood tinged  Has had tylenol  and generic claritin .

## 2023-10-12 NOTE — ED Provider Notes (Signed)
 Arlander Bellman    CSN: [id number] Arrival date & time: 10/12/23  0808      History   Chief Complaint Chief Complaint  Patient presents with   Cough    HPI Zoe Alvarado is a 78 y.o. female.  Patient presents with 1 week history of fever, body aches, sore throat, cough Tmax 99.9.  No OTC medications taken today.  Her sore throat has resolved but the cough, body aches, low-grade fever have persisted.  Over the weekend her cough has become productive of yellow and blood-tinged sputum.  She is on Xarelto .  She denies chest pain or shortness of breath.  The history is provided by the patient and medical records.    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   Cataract years   History of echocardiogram    a. 03/2021 Echo: EF 50-55%, no rwma, nl RV fxn, RVSP 25.39mmHg. Mildly dil LA. Mild MR, mild-mod TR.   Hyperlipidemia    Hypertension    Osteoporosis    Persistent atrial fibrillation (HCC)    a. 04/2021 s/p DCCV; b. 05/2021 s/p PVI/cathteter ablation; c. Chronic eliquis - CHA2DS2VASc = 4.   Pneumonia    Prolapse of female pelvic organs     Patient Active Problem List   Diagnosis Date Noted   Aortic atherosclerosis (HCC) 10/07/2022   Typical atrial flutter (HCC) 07/11/2021   Secondary hypercoagulable state (HCC) 07/11/2021   Persistent atrial fibrillation (HCC)    Palpitations 03/13/2021   Cystocele with rectocele 05/05/2019   Post-menopause on HRT (hormone replacement therapy) 05/05/2019   OP (osteoporosis) 01/15/2017   Arthritis, degenerative 01/15/2017   Climacteric 01/15/2017   Obesity (BMI 30.0-34.9) 05/02/2015   Hypertension 12/06/2014   Dyslipidemia 12/06/2014    Past Surgical History:  Procedure Laterality Date   ABDOMINAL HYSTERECTOMY     ATRIAL FIBRILLATION ABLATION N/A 06/13/2021   Procedure: ATRIAL FIBRILLATION ABLATION;  Surgeon: Boyce Byes,  MD;  Location: [facility];  Service: Cardiovascular;  Laterality: N/A;   CARDIOVERSION N/A 04/24/2021   Procedure: CARDIOVERSION;  Surgeon: Constancia Delton, MD;  Location: [facility];  Service: Cardiovascular;  Laterality: N/A;   CATARACT EXTRACTION W/PHACO Left 11/24/2019   Procedure: CATARACT EXTRACTION PHACO AND INTRAOCULAR LENS PLACEMENT (IOC) LEFT [employer];  Surgeon: Annell Kidney, MD;  Location: [facility];  Service: Ophthalmology;  Laterality: Left;  cde 16.31 us  1:39 ap 16.5    CATARACT EXTRACTION W/PHACO Right 12/14/2019   Procedure: CATARACT EXTRACTION PHACO AND INTRAOCULAR LENS PLACEMENT (IOC) RIGHT EYHANCE TORIC 3.77 00:41.5 901%;  Surgeon: Annell Kidney, MD;  Location: [facility];  Service: Ophthalmology;  Laterality: Right;   CHOLECYSTECTOMY     DENTAL SURGERY     dental implants   DILATION AND CURETTAGE OF UTERUS     KNEE SURGERY     arthroscopy   TONSILLECTOMY      OB History     Gravida  4   Para  1   Term      Preterm      AB  1   Living  3      SAB      IAB  1   Ectopic      Multiple      Live Births  3            Home Medications  Prior to Admission medications   Medication Sig Start Date End Date Taking? Authorizing Provider  doxycycline (VIBRAMYCIN) 100 MG capsule Take 1 capsule (100 mg total) by mouth 2 (two) times daily for 7 days. 10/12/23 10/19/23 Yes Wellington Half, NP  acetaminophen  (TYLENOL ) 325 MG tablet Take 650 mg by mouth as needed.    [provider]  alendronate (FOSAMAX) 70 MG tablet Take 70 mg by mouth every Tuesday. Take with a full glass of water on an empty stomach.    [provider]  Calcium -Vitamin D -Vitamin K (VIACTIV CALCIUM  PLUS D) 650-12.5-40 MG-MCG-MCG CHEW Chew 2 each by mouth daily.    [provider]  cromolyn  (OPTICROM ) 4 % ophthalmic solution Place 1 drop into both eyes 4 (four) times daily as needed. 01/14/23   Tapia, Leisa, PA-C  estradiol  (ESTRACE )  0.5 MG tablet Take 1 tablet (0.5 mg total) by mouth daily. 10/21/22   Teresa Fender, MD  loratadine  (CLARITIN ) 10 MG tablet TAKE 1 TABLET BY MOUTH AT BEDTIME Patient taking differently: Take 10 mg by mouth daily as needed for allergies. 12/03/22   Tapia, Leisa, PA-C  metoprolol  succinate (TOPROL -XL) 25 MG 24 hr tablet TAKE 1 TABLET BY MOUTH AT BEDTIME 09/03/23   Boyce Byes, MD  Multiple Vitamins-Minerals (CENTRUM MINIS WOMEN 50+) TABS Take 1 tablet by mouth 2 (two) times daily.    [provider]  Propylene Glycol (SYSTANE COMPLETE) 0.6 % SOLN Place 1 drop into both eyes in the morning and at bedtime.    [provider]  sodium chloride  (OCEAN) 0.65 % SOLN nasal spray Place 1 spray into both nostrils daily as needed for congestion.    [provider]  XARELTO  20 MG TABS tablet TAKE 1 TABLET BY MOUTH DAILY  WITH SUPPER 10/16/22   Boyce Byes, MD    Family History Family History  Problem Relation Age of Onset   Diabetes Mother    Heart attack Father    Heart disease Father    Hypertension Father    Prostate cancer Father    Cancer Father    Vision loss Father    Diabetes Brother    Breast cancer Maternal Aunt 40   Cancer Maternal Aunt    Breast cancer Maternal Grandmother    Cancer Maternal Grandmother    Ovarian cancer Neg Hx    Colon cancer Neg Hx     Social History Social History   Tobacco Use   Smoking status: Never   Smokeless tobacco: Never  Vaping Use   Vaping status: Never Used  Substance Use Topics   Alcohol use: No    Alcohol/week: 0.0 standard drinks of alcohol   Drug use: No     Allergies   Codeine and Penicillins   Review of Systems Review of Systems  Constitutional:  Positive for fever. Negative for chills.  HENT:  Negative for ear pain and sore throat.   Respiratory:  Positive for cough. Negative for shortness of breath.   Cardiovascular:  Negative for chest pain and palpitations.     Physical Exam Triage  Vital Signs ED Triage Vitals  Encounter Vitals Group     BP      Systolic BP Percentile      Diastolic BP Percentile      Pulse      Resp      Temp      Temp src      SpO2      Weight  Height      Head Circumference      Peak Flow      Pain Score      Pain Loc      Pain Education      Exclude from Growth Chart    No data found.  Updated Vital Signs BP 138/72   Pulse 85   Temp 98.7 F (37.1 C)   Resp 18   SpO2 96%   Visual Acuity Right Eye Distance:   Left Eye Distance:   Bilateral Distance:    Right Eye Near:   Left Eye Near:    Bilateral Near:     Physical Exam Constitutional:      General: She is not in acute distress. HENT:     Right Ear: Tympanic membrane normal.     Left Ear: Tympanic membrane normal.     Nose: Nose normal.     Mouth/Throat:     Mouth: Mucous membranes are moist.     Pharynx: Oropharynx is clear.  Cardiovascular:     Rate and Rhythm: Normal rate and regular rhythm.     Heart sounds: Normal heart sounds.  Pulmonary:     Effort: Pulmonary effort is normal. No respiratory distress.     Breath sounds: Normal breath sounds.  Neurological:     Mental Status: She is alert.      UC Treatments / Results  Labs (all labs ordered are listed, but only abnormal results are displayed) Labs Reviewed - No data to display  EKG   Radiology DG Chest 2 View Result Date: 10/12/2023 CLINICAL DATA:  Productive cough.  Hemoptysis.  Fever. EXAM: CHEST - 2 VIEW COMPARISON:  None Available. FINDINGS: The heart size and mediastinal contours are within normal limits. Pulmonary hyperinflation noted, suspicious for COPD. Both lungs are clear. The visualized skeletal structures are unremarkable. 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 No active cardiopulmonary disease. Electronically Signed   By: Marlyce Sine M.D.   On: 10/12/2023 08:53    Procedures Procedures (including critical care time)  Medications Ordered in UC Medications - No data to  display  Initial Impression / Assessment and Plan / UC Course  I have reviewed the triage vital signs and the nursing notes.  Pertinent labs & imaging results that were available during my care of the patient were reviewed by me and considered in my medical decision making (see chart for details).    Hemoptysis, productive cough, abnormal chest x-ray.  Afebrile and vital signs are stable.  No respiratory distress.  O2 sat 96% on room air.  Chest x-ray shows probable COPD but no active disease.  Treating today with doxycycline.  Instructed patient to follow-up with her PCP tomorrow to discuss x-ray results and also for recheck.  ED precautions given.  Education provided on hemoptysis and cough.  Patient agrees to plan of care.  Final Clinical Impressions(s) / UC Diagnoses   Final diagnoses:  Hemoptysis  Productive cough  Abnormal chest x-ray     Discharge Instructions      Your chest xray shows possible COPD.  Please follow up with your primary care provider about this.    Take the doxycycline as directed.    Follow up with your primary care provider tomorrow.  Go to the emergency department if you have worsening symptoms.      ED Prescriptions     Medication Sig Dispense Auth. Provider   doxycycline (VIBRAMYCIN) 100 MG capsule Take 1 capsule (100 mg total) by mouth 2 (two)  times daily for 7 days. 14 capsule Wellington Half, NP      PDMP not reviewed this encounter.   Wellington Half, NP 10/12/23 [phone]

## 2023-10-13 ENCOUNTER — Ambulatory Visit: Payer: Self-pay

## 2023-10-13 ENCOUNTER — Other Ambulatory Visit: Payer: Self-pay

## 2023-10-13 ENCOUNTER — Ambulatory Visit: Admitting: Internal Medicine

## 2023-10-13 ENCOUNTER — Encounter: Payer: Self-pay | Admitting: Internal Medicine

## 2023-10-13 VITALS — BP 110/72 | HR 80 | Temp 97.9°F | Resp 16 | Ht 59.0 in | Wt 129.5 lb

## 2023-10-13 DIAGNOSIS — R0982 Postnasal drip: Secondary | ICD-10-CM | POA: Diagnosis not present

## 2023-10-13 DIAGNOSIS — R042 Hemoptysis: Secondary | ICD-10-CM

## 2023-10-13 MED ORDER — BENZONATATE 100 MG PO CAPS
100.0000 mg | ORAL_CAPSULE | Freq: Two times a day (BID) | ORAL | 0 refills | Status: DC | PRN
Start: 2023-10-13 — End: 2024-01-25

## 2023-10-13 MED ORDER — ALBUTEROL SULFATE HFA 108 (90 BASE) MCG/ACT IN AERS: 2.0000 | INHALATION_SPRAY | Freq: Four times a day (QID) | RESPIRATORY_TRACT | 2 refills | Status: DC | PRN

## 2023-10-13 NOTE — Telephone Encounter (Signed)
  Chief Complaint: cough, blood tinged sputum,  Symptoms: cough, hemoptysis Frequency: about 1.5-2 weeks Pertinent Negatives: Patient denies SOB at rest,  Disposition: [] ED /[] Urgent Care (no appt availability in office) / [x] Appointment(In office/virtual)/ []  [facility]/ [] Home Care/ [] Refused Recommended Disposition /[] [facility]/ []  Follow-up with PCP Additional Notes: Pt states that she has been having common cold s/s, low grade temps and has been coughing up small amounts of blood. Pt is on blood thinners and was [facility], was told to go to primary care. Pt was started on doxy yesterday. States she is SOB during certain activities/exertion. Copied from CRM [id number]. Topic: Clinical - Red Word Triage >> Oct 13, 2023  8:41 AM Dewanda Foots wrote: Red Word that prompted transfer to Nurse Triage: Patient has had a rough cough with discolored mucus for 2 weeks now. States there are stringy particules in her discharge along with bits of blood.  [facility] issue on 5/26 where they did an Xray and found the results to be abnormal but wanted to have her follow up with PCP.  Pt states she is having bloody cough this morning still. Has hx of blood thinner medication due to AFIB. Reason for Disposition  [1] MILD difficulty breathing (e.g., minimal/no SOB at rest, SOB with walking, pulse <100) AND [2] still present when not coughing  (Exception: No change from usual, chronic shortness of breath.)  Answer Assessment - Initial Assessment Questions 1. ONSET: "When did the cough begin?"      2 weeks 2. SEVERITY: "How bad is the cough today?" "Did the blood appear after a coughing spell?"      moderate 3. SPUTUM: "Describe the color of your sputum" (none, dry cough; clear, white, yellow, green)     Blood tinged 4. HEMOPTYSIS: "How much blood?" (flecks, streaks, tablespoons, etc.)     Flecks and streaks 5. DIFFICULTY BREATHING: "Are you having difficulty  breathing?" If Yes, ask: "How bad is it?" (e.g., mild, moderate, severe)    - MILD: No SOB at rest, mild SOB with walking, speaks normally in sentences, can lie down, no retractions, pulse < 100.    - MODERATE: SOB at rest, SOB with minimal exertion and prefers to sit, cannot lie down flat, speaks in phrases, mild retractions, audible wheezing, pulse 100-120.    - SEVERE: Very SOB at rest, speaks in single words, struggling to breathe, sitting hunched forward, retractions, pulse > 120      denies 6. FEVER: "Do you have a fever?" If Yes, ask: "What is your temperature, how was it measured, and when did it start?"     Low grade for me, 99.5 is highest it has been 7. CARDIAC HISTORY: "Do you have any history of heart disease?" (e.g., heart attack, congestive heart failure)      AFIB,  8. LUNG HISTORY: "Do you have any history of lung disease?"  (e.g., pulmonary embolus, asthma, emphysema)     Had pneumonia once 9. PE RISK FACTORS: "Do you have a history of blood clots?" (or: recent major surgery, recent prolonged travel, bedridden)     denies 10. OTHER SYMPTOMS: "Do you have any other symptoms?" (e.g., runny nose, wheezing, chest pain)       Common cold s/s  Protocols used: Coughing Up Blood-A-AH

## 2023-10-13 NOTE — Progress Notes (Signed)
 Acute Office Visit  Subjective:     Patient ID: Zoe Alvarado, female    DOB: [birth year], 78 y.o.   MRN: [id number]  Chief Complaint  Patient presents with   Cough    Seen specs of blood from mucous in nose and throat for 1 week. Seen at [facility] on 05/26    Cough Associated symptoms include hemoptysis, a sore throat and shortness of breath. Pertinent negatives include no chest pain, chills, ear pain, fever or wheezing.   Patient is in today for productive cough with blood.   Discussed the use of AI scribe software for clinical note transcription with the patient, who gave verbal consent to proceed.  History of Present Illness Zoe Alvarado is a 78 year old female with atrial fibrillation who presents with a productive cough and blood-streaked mucus.  She has experienced symptoms for over a week, starting with a sore throat and progressing to a productive cough with mucus. Over the weekend, she noticed blood-streaked mucus. The cough is chronic but improved after atrial fibrillation treatment. Currently, it is productive with mucus containing small blood streaks, but no clots. She experiences shortness of breath when walking uphill but not during routine activities. Oxygen saturation at home remains between 96 to 98%. A chest x-ray showed pulmonary hyperinflation. She has no smoking history but past secondhand smoke exposure. A cardiac CT in 2023 noted calcified granulomas in the lungs. She started doxycycline for a presumed sinus infection with slight symptom improvement. A home COVID test was negative, and she uses nasal saline spray daily.    Review of Systems  Constitutional:  Negative for chills and fever.  HENT:  Positive for sinus pain and sore throat. Negative for congestion and ear pain.   Respiratory:  Positive for cough, hemoptysis, sputum production and shortness of breath. Negative for wheezing.   Cardiovascular:  Negative for chest pain.        Objective:    BP 110/72  (Cuff Size: Normal)   Pulse 80   Temp 97.9 F (36.6 C) (Oral)   Resp 16   Ht 4\' 11"  (1.499 m)   Wt 129 lb 8 oz (58.7 kg)   SpO2 94%   BMI 26.16 kg/m  BP Readings from Last 3 Encounters:  10/13/23 110/72  10/12/23 138/72  09/16/23 128/72   Wt Readings from Last 3 Encounters:  10/13/23 129 lb 8 oz (58.7 kg)  09/16/23 129 lb 12.8 oz (58.9 kg)  01/14/23 131 lb 4.8 oz (59.6 kg)      Physical Exam Constitutional:      Appearance: Normal appearance.  HENT:     Head: Normocephalic and atraumatic.     Right Ear: Ear canal and external ear normal. There is impacted cerumen.     Left Ear: Ear canal and external ear normal. There is impacted cerumen.     Nose: Nose normal.     Mouth/Throat:     Mouth: Mucous membranes are moist.     Pharynx: Posterior oropharyngeal erythema present.  Eyes:     Conjunctiva/sclera: Conjunctivae normal.  Cardiovascular:     Rate and Rhythm: Normal rate and regular rhythm.  Pulmonary:     Effort: Pulmonary effort is normal.     Breath sounds: Normal breath sounds. No wheezing, rhonchi or rales.  Skin:    General: Skin is warm and dry.  Neurological:     General: No focal deficit present.     Mental Status: She is alert. Mental status  is at baseline.  Psychiatric:        Mood and Affect: Mood normal.        Behavior: Behavior normal.     No results found for any visits on 10/13/23.      Assessment & Plan:   Assessment & Plan Sinusitis Symptoms consistent with sinusitis. Recent chest x-ray showed pulmonary hyperinflation suspicious for COPD. Current treatment with doxycycline is appropriate. Symptoms expected to improve with antibiotics. Informed consent obtained for doxycycline. - Continue doxycycline for 7 days. - Prescribe Tessalon  pearls for cough suppression. - Recommend nasal saline spray, 2 sprays each nostril twice daily. - Prescribe albuterol inhaler for shortness of breath as needed.  Cough with Hemoptysis  Cough likely  exacerbated by sinusitis and throat irritation. No current evidence of asthma or COPD. Discussed option of PFT to rule out COPD due to secondhand smoke exposure, deferred until after resolution of current symptoms. - Monitor response to current treatment for sinusitis. - Consider PFT if symptoms persist.  Throat irritation Throat irritation likely due to sinus drainage and frequent coughing. Blood-streaked mucus likely from throat irritation. - Increase nasal saline spray to 2 sprays each nostril twice daily. - Continue current antibiotic treatment.  - albuterol (VENTOLIN HFA) 108 (90 Base) MCG/ACT inhaler; Inhale 2 puffs into the lungs every 6 (six) hours as needed for wheezing or shortness of breath.  Dispense: 8 g; Refill: 2 - benzonatate  (TESSALON ) 100 MG capsule; Take 1 capsule (100 mg total) by mouth 2 (two) times daily as needed for cough.  Dispense: 20 capsule; Refill: 0    Return in about 6 weeks (around 11/24/2023) for TOC.  Rockney Cid, DO

## 2023-10-15 ENCOUNTER — Telehealth: Payer: Self-pay

## 2023-10-15 MED ORDER — ESTRADIOL 0.5 MG PO TABS: 0.5000 mg | ORAL_TABLET | Freq: Every day | ORAL | 3 refills | Status: AC

## 2023-10-15 NOTE — Telephone Encounter (Signed)
Rx RF sent, pt aware. 

## 2023-10-15 NOTE — Telephone Encounter (Signed)
 Pt called triage to follow up on this Rx RF. Pt was last seen 07/01/22 for Annual. Dr. Ranny Bye last note stated "Return to Clinic - 1 Year or every other year based on insurance". Pls advise if we she have pt return this year or next year and if a refill can be sent.

## 2023-11-11 ENCOUNTER — Ambulatory Visit: Payer: Self-pay | Admitting: Cardiology

## 2023-11-13 ENCOUNTER — Ambulatory Visit: Payer: Self-pay | Admitting: Cardiology

## 2023-11-14 ENCOUNTER — Other Ambulatory Visit: Payer: Self-pay | Admitting: Cardiology

## 2023-11-14 DIAGNOSIS — I4819 Other persistent atrial fibrillation: Secondary | ICD-10-CM

## 2023-11-16 ENCOUNTER — Other Ambulatory Visit: Payer: Self-pay | Admitting: Family Medicine

## 2023-11-16 DIAGNOSIS — J329 Chronic sinusitis, unspecified: Secondary | ICD-10-CM

## 2023-11-16 NOTE — Telephone Encounter (Signed)
 Prescription refill request for Xarelto  received.  Indication:afib Last office visit:4/25 Weight:58.7  kg Age:78 Scr:0.5  10/24 CrCl:88.7  kg  Prescription refilled

## 2023-11-18 NOTE — Telephone Encounter (Signed)
 Refused EQ Allergy 10 mg tablet.  It was discontinued 10/13/2023.

## 2023-11-24 ENCOUNTER — Encounter: Payer: Self-pay | Admitting: Family Medicine

## 2023-11-24 ENCOUNTER — Ambulatory Visit: Admitting: Family Medicine

## 2023-11-24 VITALS — HR 77 | Resp 16 | Ht 59.0 in | Wt 125.0 lb

## 2023-11-24 DIAGNOSIS — I10 Essential (primary) hypertension: Secondary | ICD-10-CM

## 2023-11-24 NOTE — Progress Notes (Signed)
 Name: KRISTALYNN CODDINGTON   MRN: [id number]    DOB: 31-May-1945   Date:11/24/2023       Progress Note  Chief Complaint  Patient presents with   Transitions Of Care   Pt appt was made in error - not seen today by me after discussion pt rescheduled appt with Dr. DELENA  Subjective:   TENNELLE TAFLINGER is a 78 y.o. female, presents to clinic for routine follow up on chronic conditions  Hx of osteoporosis on fosamax and on vit D and calcium  supplements, also on estradiol  previously from GYN  Hx of afib s/p ablation est with cardiology HTN on metoprolol , anticoagulated on xarelto       Current Outpatient Medications:    acetaminophen  (TYLENOL ) 325 MG tablet, Take 650 mg by mouth as needed., Disp: , Rfl:    albuterol  (VENTOLIN  HFA) 108 (90 Base) MCG/ACT inhaler, Inhale 2 puffs into the lungs every 6 (six) hours as needed for wheezing or shortness of breath., Disp: 8 g, Rfl: 2   alendronate (FOSAMAX) 70 MG tablet, Take 70 mg by mouth every Tuesday. Take with a full glass of water on an empty stomach., Disp: , Rfl:    benzonatate  (TESSALON ) 100 MG capsule, Take 1 capsule (100 mg total) by mouth 2 (two) times daily as needed for cough., Disp: 20 capsule, Rfl: 0   Calcium -Vitamin D -Vitamin K (VIACTIV CALCIUM  PLUS D) 650-12.5-40 MG-MCG-MCG CHEW, Chew 2 each by mouth daily., Disp: , Rfl:    estradiol  (ESTRACE ) 0.5 MG tablet, Take 1 tablet (0.5 mg total) by mouth daily., Disp: 90 tablet, Rfl: 3   metoprolol  succinate (TOPROL -XL) 25 MG 24 hr tablet, TAKE 1 TABLET BY MOUTH AT BEDTIME, Disp: 90 tablet, Rfl: 0   Multiple Vitamins-Minerals (CENTRUM MINIS WOMEN 50+) TABS, Take 1 tablet by mouth 2 (two) times daily., Disp: , Rfl:    Propylene Glycol (SYSTANE COMPLETE) 0.6 % SOLN, Place 1 drop into both eyes in the morning and at bedtime., Disp: , Rfl:    rivaroxaban  (XARELTO ) 20 MG TABS tablet, TAKE 1 TABLET BY MOUTH ONCE DAILY WITH  SUPPER, Disp: 30 tablet, Rfl: 5   sodium chloride  (OCEAN) 0.65 % SOLN nasal spray, Place  1 spray into both nostrils daily as needed for congestion., Disp: , Rfl:   Patient Active Problem List   Diagnosis Date Noted   Aortic atherosclerosis (HCC) 10/07/2022   Typical atrial flutter (HCC) 07/11/2021   Secondary hypercoagulable state (HCC) 07/11/2021   Persistent atrial fibrillation (HCC)    Palpitations 03/13/2021   Cystocele with rectocele 05/05/2019   Post-menopause on HRT (hormone replacement therapy) 05/05/2019   OP (osteoporosis) 01/15/2017   Arthritis, degenerative 01/15/2017   Climacteric 01/15/2017   Obesity (BMI 30.0-34.9) 05/02/2015   Hypertension 12/06/2014   Dyslipidemia 12/06/2014    Past Surgical History:  Procedure Laterality Date   ABDOMINAL HYSTERECTOMY     ATRIAL FIBRILLATION ABLATION N/A 06/13/2021   Procedure: ATRIAL FIBRILLATION ABLATION;  Surgeon: Cindie Ole DASEN, MD;  [facility];  Service: Cardiovascular;  Laterality: N/A;   CARDIOVERSION N/A 04/24/2021   Procedure: CARDIOVERSION;  Surgeon: Darliss Rogue, MD;  Location: [facility];  Service: Cardiovascular;  Laterality: N/A;   CATARACT EXTRACTION W/PHACO Left 11/24/2019   Procedure: CATARACT EXTRACTION PHACO AND INTRAOCULAR LENS PLACEMENT (IOC) LEFT COY GREGO;  Surgeon: Mittie Gaskin, MD;  Location: [facility];  Service: Ophthalmology;  Laterality: Left;  cde 16.31 us  1:39 ap 16.5    CATARACT EXTRACTION W/PHACO Right 12/14/2019  Procedure: CATARACT EXTRACTION PHACO AND INTRAOCULAR LENS PLACEMENT (IOC) RIGHT EYHANCE TORIC 3.77 00:41.5 901%;  Surgeon: Mittie Gaskin, MD;  Location: [facility];  Service: Ophthalmology;  Laterality: Right;   CHOLECYSTECTOMY     DENTAL SURGERY     dental implants   DILATION AND CURETTAGE OF UTERUS     KNEE SURGERY     arthroscopy   TONSILLECTOMY      Family History  Problem Relation Age of Onset   Diabetes Mother    Heart attack Father    Heart disease Father    Hypertension Father    Prostate cancer Father     Cancer Father    Vision loss Father    Diabetes Brother    Breast cancer Maternal Aunt 40   Cancer Maternal Aunt    Breast cancer Maternal Grandmother    Cancer Maternal Grandmother    Ovarian cancer Neg Hx    Colon cancer Neg Hx     Social History   Tobacco Use   Smoking status: Never   Smokeless tobacco: Never  Vaping Use   Vaping status: Never Used  Substance Use Topics   Alcohol use: No    Alcohol/week: 0.0 standard drinks of alcohol   Drug use: No     Allergies  Allergen Reactions   Codeine Rash    On face and shoulders   Penicillins Rash    On face and should / in 1970    Health Maintenance  Topic Date Due   Medicare Annual Wellness (AWV)  05/16/2020   COVID-19 Vaccine (6 - 2024-25 season) 12/09/2023 (Originally 01/18/2023)   Zoster Vaccines- Shingrix (1 of 2) 02/23/2024 (Originally 07/14/1997)   DTaP/Tdap/Td (2 - Td or Tdap) 11/22/2024 (Originally 04/23/2023)   INFLUENZA VACCINE  12/18/2023   Pneumococcal Vaccine: 50+ Years  Completed   DEXA SCAN  Completed   Hepatitis C Screening  Completed   Hepatitis B Vaccines  Aged Out   HPV VACCINES  Aged Out   Meningococcal B Vaccine  Aged Out   Colonoscopy  Discontinued    Chart Review Today:  Review of Systems   Objective:   Vitals:   11/24/23 0921  Resp: 16  Height: 4' 11 (1.499 m)    Body mass index is 26.16 kg/m.  Physical Exam   Functional Status Survey: Is the patient deaf or have difficulty hearing?: No Does the patient have difficulty seeing, even when wearing glasses/contacts?: No Does the patient have difficulty concentrating, remembering, or making decisions?: No Does the patient have difficulty walking or climbing stairs?: No Does the patient have difficulty dressing or bathing?: No Does the patient have difficulty doing errands alone such as visiting a doctor's office or shopping?: No Results for orders placed or performed in visit on 01/14/23  Hepatitis C antibody   Collection Time:  01/14/23  9:51 AM  Result Value Ref Range   Hepatitis C Ab NON-REACTIVE NON-REACTIVE  COMPLETE METABOLIC PANEL WITH GFR   Collection Time: 01/14/23  9:51 AM  Result Value Ref Range   Glucose, Bld 82 65 - 99 mg/dL   BUN 9 7 - 25 mg/dL   Creat 9.41 (L) 9.39 - 1.00 mg/dL   eGFR 94 > OR = 60 fO/fpw/8.26f7   BUN/Creatinine Ratio 16 6 - 22 (calc)   Sodium 140 135 - 146 mmol/L   Potassium 4.5 3.5 - 5.3 mmol/L   Chloride 106 98 - 110 mmol/L   CO2 28 20 - 32 mmol/L   Calcium   9.2 8.6 - 10.4 mg/dL   Total Protein 6.8 6.1 - 8.1 g/dL   Albumin 4.1 3.6 - 5.1 g/dL   Globulin 2.7 1.9 - 3.7 g/dL (calc)   AG Ratio 1.5 1.0 - 2.5 (calc)   Total Bilirubin 0.8 0.2 - 1.2 mg/dL   Alkaline phosphatase (APISO) 91 37 - 153 U/L   AST 18 10 - 35 U/L   ALT 13 6 - 29 U/L  Lipid panel   Collection Time: 01/14/23  9:51 AM  Result Value Ref Range   Cholesterol 182 <200 mg/dL   HDL 65 > OR = 50 mg/dL   Triglycerides 92 <849 mg/dL   LDL Cholesterol (Calc) 98 mg/dL (calc)   Total CHOL/HDL Ratio 2.8 <5.0 (calc)   Non-HDL Cholesterol (Calc) 117 <130 mg/dL (calc)  CBC with Differential/Platelet   Collection Time: 01/14/23  9:51 AM  Result Value Ref Range   WBC 5.6 3.8 - 10.8 Thousand/uL   RBC 4.53 3.80 - 5.10 Million/uL   Hemoglobin 13.6 11.7 - 15.5 g/dL   HCT 60.1 64.9 - 54.9 %   MCV 87.9 80.0 - 100.0 fL   MCH 30.0 27.0 - 33.0 pg   MCHC 34.2 32.0 - 36.0 g/dL   RDW 87.6 88.9 - 84.9 %   Platelets 227 140 - 400 Thousand/uL   MPV 10.0 7.5 - 12.5 fL   Neutro Abs 3,903 1,500 - 7,800 cells/uL   Lymphs Abs 1,193 850 - 3,900 cells/uL   Absolute Monocytes 386 200 - 950 cells/uL   Eosinophils Absolute 67 15 - 500 cells/uL   Basophils Absolute 50 0 - 200 cells/uL   Neutrophils Relative % 69.7 %   Total Lymphocyte 21.3 %   Monocytes Relative 6.9 %   Eosinophils Relative 1.2 %   Basophils Relative 0.9 %      Assessment & Plan:   There are no diagnoses linked to this encounter.   No follow-ups on file.    Michelene Cower, PA-C 11/24/23 9:23 AM

## 2023-11-28 ENCOUNTER — Other Ambulatory Visit: Payer: Self-pay | Admitting: Cardiology

## 2024-01-25 ENCOUNTER — Ambulatory Visit (INDEPENDENT_AMBULATORY_CARE_PROVIDER_SITE_OTHER): Admitting: Internal Medicine

## 2024-01-25 ENCOUNTER — Other Ambulatory Visit: Payer: Self-pay

## 2024-01-25 ENCOUNTER — Encounter: Payer: Self-pay | Admitting: Internal Medicine

## 2024-01-25 VITALS — BP 132/76 | HR 86 | Temp 97.9°F | Resp 16 | Ht 59.0 in | Wt 129.4 lb

## 2024-01-25 DIAGNOSIS — I10 Essential (primary) hypertension: Secondary | ICD-10-CM | POA: Diagnosis not present

## 2024-01-25 DIAGNOSIS — M810 Age-related osteoporosis without current pathological fracture: Secondary | ICD-10-CM | POA: Diagnosis not present

## 2024-01-25 DIAGNOSIS — Z1211 Encounter for screening for malignant neoplasm of colon: Secondary | ICD-10-CM

## 2024-01-25 DIAGNOSIS — E785 Hyperlipidemia, unspecified: Secondary | ICD-10-CM | POA: Diagnosis not present

## 2024-01-25 DIAGNOSIS — Z23 Encounter for immunization: Secondary | ICD-10-CM | POA: Diagnosis not present

## 2024-01-25 DIAGNOSIS — M1711 Unilateral primary osteoarthritis, right knee: Secondary | ICD-10-CM

## 2024-01-25 DIAGNOSIS — I4819 Other persistent atrial fibrillation: Secondary | ICD-10-CM | POA: Diagnosis not present

## 2024-01-25 NOTE — Patient Instructions (Addendum)
 It was great seeing you today!  Plan discussed at today's visit: -Blood work ordered today, results will be uploaded to MyChart. Please return fasting any weekday 8-11:30 and 1:30-3:30.  -Cologuard ordered.   Follow up in: 1 year  Take care and let us  know if you have any questions or concerns prior to your next visit.  Dr. Bernardo  Pneumococcal Conjugate Vaccine (PCV20) Injection What is this medication? PNEUMOCOCCAL CONJUGATE VACCINE (NEU mo KOK al kon ju gate vak SEEN) reduces the risk of pneumococcal disease, such as pneumonia. It does not treat pneumococcal disease. It is still possible to get pneumococcal disease after receiving this vaccine, but the symptoms may be less severe or not last as long. It works by helping your immune system learn how to fight off a future infection. This medicine may be used for other purposes; ask your health care provider or pharmacist if you have questions. COMMON BRAND NAME(S): Prevnar 20 What should I tell my care team before I take this medication? They need to know if you have any of these conditions: Bleeding disorder Fever Immune system problems An unusual or allergic reaction to pneumococcal vaccine, diphtheria toxoid, other vaccines, other medications, foods, dyes, or preservatives Pregnant or trying to get pregnant Breastfeeding How should I use this medication? This vaccine is injected into a muscle. It is given by your care team. A copy of Vaccine Information Statements will be given before each vaccination. Be sure to read this information carefully each time. This sheet may change often. Talk to your care team about the use of this medication in children. While it may be given to children as young as 6 weeks for selected conditions, precautions do apply. Overdosage: If you think you have taken too much of this medicine contact a poison control center or emergency room at once. NOTE: This medicine is only for you. Do not share this  medicine with others. What if I miss a dose? This does not apply. This medication is not for regular use. What may interact with this medication? Medications for cancer chemotherapy Medications that suppress your immune function Steroid medications, such as prednisone or cortisone This list may not describe all possible interactions. Give your health care provider a list of all the medicines, herbs, non-prescription drugs, or dietary supplements you use. Also tell them if you smoke, drink alcohol, or use illegal drugs. Some items may interact with your medicine. What should I watch for while using this medication? Visit your care team regularly. Report any side effects to your care team right away. This vaccine, like all vaccines, may not fully protect everyone. What side effects may I notice from receiving this medication? Side effects that you should report to your care team as soon as possible: Allergic reactions--skin rash, itching, hives, swelling of the face, lips, tongue, or throat Side effects that usually do not require medical attention (report these to your care team if they continue or are bothersome): Fatigue Fever Headache Joint pain Muscle pain Pain, redness, or irritation at injection site This list may not describe all possible side effects. Call your doctor for medical advice about side effects. You may report side effects to FDA at 1-800-FDA-1088. Where should I keep my medication? This vaccine is only given by your care team. It will not be stored at home. NOTE: This sheet is a summary. It may not cover all possible information. If you have questions about this medicine, talk to your doctor, pharmacist, or health care provider.  2024 Elsevier/Gold Standard (2021-10-16 00:00:00)

## 2024-01-25 NOTE — Progress Notes (Signed)
 New Patient Office Visit  Subjective    Patient ID: Zoe Alvarado, female    DOB: [birth year]  Age: 78 y.o. MRN: [id number]  CC:  Chief Complaint  Patient presents with   Medical Management of Chronic Issues    HPI Zoe Alvarado presents to transfer care.  Discussed the use of AI scribe software for clinical note transcription with the patient, who gave verbal consent to proceed.  History of Present Illness Zoe Alvarado is a 78 year old female with hypertension and atrial fibrillation who presents for a comprehensive review of her chronic conditions.  Hypertension and atrial fibrillation are well-managed with metoprolol  25 mg daily and Xarelto . She underwent a successful ablation after an unsuccessful cardioversion. Blood pressure readings at home are typically lower, monitored with a verified wristband device.  Osteoporosis and osteoarthritis are managed with estrogen and Fosamax for nearly five years. She is due for a follow-up and bone scan next month. Knee pain from osteoarthritis is managed with Tylenol , and she is considering surgery but is concerned about bone health and procedural risks.  She is actively working on weight loss, attending the gym two to three times a week, currently weighing 129 pounds with a goal of 120 pounds, using Weight Watchers for support.  She has received five COVID-19 vaccinations and had COVID-19 twice. She is aware of the new pneumonia and RSV vaccines and is due for a tetanus booster.   Hypertension/Atrial Fibrillation: -s/p cardioversion didn't work then ablation was successful  -Medications: Metoprolol  25 mg, Xarelto  20 mg  -Patient is compliant with above medications and reports no side effects. -Checking BP at home (average): stable at home -Denies any SOB, CP, vision changes, LE edema or symptoms of hypotension  Osteoporosis: -Following Dr. Damian -Currently on Estradiol  0.5 mg, Fosamax 70 mg once weekly   Health Maintenance: -Blood  work due -Mammogram 1/25 Birads-1 -Cologuard due -Discussed Prevnar 20, RSV vaccines, flu given today   Outpatient Encounter Medications as of 01/25/2024  Medication Sig   acetaminophen  (TYLENOL ) 325 MG tablet Take 650 mg by mouth as needed.   alendronate (FOSAMAX) 70 MG tablet Take 70 mg by mouth every Tuesday. Take with a full glass of water on an empty stomach.   Calcium -Vitamin D -Vitamin K (VIACTIV CALCIUM  PLUS D) 650-12.5-40 MG-MCG-MCG CHEW Chew 2 each by mouth daily.   estradiol  (ESTRACE ) 0.5 MG tablet Take 1 tablet (0.5 mg total) by mouth daily.   metoprolol  succinate (TOPROL -XL) 25 MG 24 hr tablet TAKE 1 TABLET BY MOUTH AT BEDTIME   Multiple Vitamins-Minerals (CENTRUM MINIS WOMEN 50+) TABS Take 1 tablet by mouth 2 (two) times daily.   Propylene Glycol (SYSTANE COMPLETE) 0.6 % SOLN Place 1 drop into both eyes in the morning and at bedtime.   rivaroxaban  (XARELTO ) 20 MG TABS tablet TAKE 1 TABLET BY MOUTH ONCE DAILY WITH  SUPPER   sodium chloride  (OCEAN) 0.65 % SOLN nasal spray Place 1 spray into both nostrils daily as needed for congestion.   albuterol  (VENTOLIN  HFA) 108 (90 Base) MCG/ACT inhaler Inhale 2 puffs into the lungs every 6 (six) hours as needed for wheezing or shortness of breath.   benzonatate  (TESSALON ) 100 MG capsule Take 1 capsule (100 mg total) by mouth 2 (two) times daily as needed for cough.   No facility-administered encounter medications on file as of 01/25/2024.    Past Medical History:  Diagnosis Date   Acute non-recurrent maxillary sinusitis 03/20/2016   Allergy    Arrhythmia  2020   sometimes   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   Cataract years   History of echocardiogram    a. 03/2021 Echo: EF 50-55%, no rwma, nl RV fxn, RVSP 25.74mmHg. Mildly dil LA. Mild MR, mild-mod TR.   Hyperlipidemia    improved recently   Hypertension    controlled with rx med.   Osteoporosis    Persistent atrial fibrillation (HCC)    a. 04/2021 s/p DCCV; b. 05/2021 s/p  PVI/cathteter ablation; c. Chronic eliquis - CHA2DS2VASc = 4.   Pneumonia    Prolapse of female pelvic organs     Past Surgical History:  Procedure Laterality Date   ABDOMINAL HYSTERECTOMY     ATRIAL FIBRILLATION ABLATION N/A 06/13/2021   Procedure: ATRIAL FIBRILLATION ABLATION;  Surgeon: Cindie Ole DASEN, MD;  Location: [facility];  Service: Cardiovascular;  Laterality: N/A;   CARDIOVERSION N/A 04/24/2021   Procedure: CARDIOVERSION;  Surgeon: Darliss Rogue, MD;  Location: [facility];  Service: Cardiovascular;  Laterality: N/A;   CATARACT EXTRACTION W/PHACO Left 11/24/2019   Procedure: CATARACT EXTRACTION PHACO AND INTRAOCULAR LENS PLACEMENT (IOC) LEFT COY GREGO;  Surgeon: Mittie Gaskin, MD;  Location: [facility];  Service: Ophthalmology;  Laterality: Left;  cde 16.31 us  1:39 ap 16.5    CATARACT EXTRACTION W/PHACO Right 12/14/2019   Procedure: CATARACT EXTRACTION PHACO AND INTRAOCULAR LENS PLACEMENT (IOC) RIGHT EYHANCE TORIC 3.77 00:41.5 901%;  Surgeon: Mittie Gaskin, MD;  Location: [facility];  Service: Ophthalmology;  Laterality: Right;   CHOLECYSTECTOMY     DENTAL SURGERY     dental implants   DILATION AND CURETTAGE OF UTERUS     KNEE SURGERY     arthroscopy   TONSILLECTOMY      Family History  Problem Relation Age of Onset   Diabetes Mother    Heart attack Father    Heart disease Father    Hypertension Father    Prostate cancer Father    Cancer Father    Vision loss Father    Diabetes Brother    Breast cancer Maternal Aunt 40   Cancer Maternal Aunt    Breast cancer Maternal Grandmother    Cancer Maternal Grandmother    Ovarian cancer Neg Hx    Colon cancer Neg Hx     Social History   Socioeconomic History   Marital status: Married    Spouse name: Not on file   Number of children: 3   Years of education: Not on file   Highest education level: Some college, no degree  Occupational History   Not on file  Tobacco Use   Smoking  status: Never   Smokeless tobacco: Never  Vaping Use   Vaping status: Never Used  Substance and Sexual Activity   Alcohol use: No    Alcohol/week: 0.0 standard drinks of alcohol   Drug use: No   Sexual activity: Yes    Partners: Male    Birth control/protection: Surgical  Other Topics Concern   Not on file  Social History Narrative   2nd marriage   3 living sons, 2 deceased   Social Drivers of Health   Financial Resource Strain: Low Risk  (11/23/2023)   Overall Financial Resource Strain (CARDIA)    Difficulty of Paying Living Expenses: Not hard at all  Food Insecurity: No Food Insecurity (11/23/2023)   Hunger Vital Sign    Worried About Running Out of Food in the Last Year: Never true    Ran Out  of Food in the Last Year: Never true  Transportation Needs: No Transportation Needs (11/23/2023)   PRAPARE - Administrator, Civil Service (Medical): No    Lack of Transportation (Non-Medical): No  Physical Activity: Insufficiently Active (11/23/2023)   Exercise Vital Sign    Days of Exercise per Week: 3 days    Minutes of Exercise per Session: 30 min  Stress: No Stress Concern Present (11/23/2023)   [employer] of Occupational Health - Occupational Stress Questionnaire    Feeling of Stress: Not at all  Social Connections: Socially Integrated (11/23/2023)   Social Connection and Isolation Panel    Frequency of Communication with Friends and Family: More than three times a week    Frequency of Social Gatherings with Friends and Family: Twice a week    Attends Religious Services: More than 4 times per year    Active Member of [employer] or Organizations: Yes    Attends Banker Meetings: More than 4 times per year    Marital Status: Married  Catering manager Violence: Not At Risk (11/24/2023)   Humiliation, Afraid, Rape, and Kick questionnaire    Fear of Current or Ex-Partner: No    Emotionally Abused: No    Physically Abused: No    Sexually Abused: No    Review of  Systems  All other systems reviewed and are negative.       Objective    BP 132/76 (Cuff Size: Normal)   Pulse 86   Temp 97.9 F (36.6 C) (Oral)   Resp 16   Ht 4' 11 (1.499 m)   Wt 129 lb 6.4 oz (58.7 kg)   SpO2 95%   BMI 26.14 kg/m   Physical Exam Constitutional:      Appearance: Normal appearance.  HENT:     Head: Normocephalic and atraumatic.  Eyes:     Conjunctiva/sclera: Conjunctivae normal.  Cardiovascular:     Rate and Rhythm: Normal rate and regular rhythm.  Pulmonary:     Effort: Pulmonary effort is normal.     Breath sounds: Normal breath sounds.  Musculoskeletal:     Right lower leg: No edema.     Left lower leg: No edema.  Skin:    General: Skin is warm and dry.  Neurological:     General: No focal deficit present.     Mental Status: She is alert. Mental status is at baseline.  Psychiatric:        Mood and Affect: Mood normal.        Behavior: Behavior normal.         Assessment & Plan:   Assessment & Plan Other persistent atrial fibrillation Atrial fibrillation well-controlled post-ablation. Continued Xarelto  for stroke prevention due to increased stroke risk with age and potential interactions. - Continue metoprolol  25 mg daily. - Continue Xarelto . - Discuss with cardiologist about the possibility of discontinuing Xarelto  in the future.  Essential hypertension Blood pressure well-controlled with current regimen. Recent reading 132/76 mmHg, slightly higher than usual. - Continue current blood pressure management. - Monitor blood pressure periodically at home.  Age-related osteoporosis without current pathological fracture Osteoporosis managed with estradiol  and Fosamax. Discussed potential transition to Prolia or Reclast with endocrinologist. - Continue estradiol  and Fosamax until endocrinology follow-up. - Discuss potential transition to Prolia or Reclast with endocrinologist. - Order bone density scan prior to endocrinology  appointment.  Unilateral primary osteoarthritis, right knee Chronic right knee osteoarthritis with bone-on-bone changes. Discussed potential benefits and risks of knee  replacement surgery. - Continue Tylenol  for pain management. - Consider knee replacement surgery after discussing bone health with endocrinologist. - Continue gym exercises to strengthen knee.  Overweight Weighs 129 lbs with BMI of 26.1, aiming for 120 lbs. Engaged in Weight Watchers and regular exercise. - Continue Weight Watchers program. - Continue regular exercise regimen. - Set short-term weight goal of 120 lbs.  General Health Maintenance Discussed vaccinations and screenings. Received flu vaccine. Discussed Prevnar 20, tetanus booster, RSV vaccine, and regular screenings. - Administer Prevnar 20 at a future visit. - Obtain tetanus booster at pharmacy. - Consider RSV vaccine at pharmacy. - Order Cologuard for colon cancer screening. - Schedule mammogram after the new year.  Follow-Up Discussed need for regular follow-up visits and lab work. Labs due for annual check-up. - Order annual labs and return fasting for blood draw. - Schedule follow-up visit in one year or as needed.  - CBC w/Diff/Platelet - Comprehensive Metabolic Panel (CMET) - Lipid Profile - Vitamin D  (25 hydroxy) - Cologuard - Flu vaccine HIGH DOSE PF(Fluzone Trivalent)   Return in about 1 year (around 01/24/2025).   Sharyle Fischer, DO

## 2024-02-12 ENCOUNTER — Ambulatory Visit: Payer: Self-pay | Admitting: Internal Medicine

## 2024-02-12 LAB — LIPID PANEL
Cholesterol: 166 mg/dL (ref <200)
HDL: 62 mg/dL (ref >=50)
LDL Cholesterol (Calc): 86 mg/dL
Non-HDL Cholesterol (Calc): 104 mg/dL (ref <130)
Total CHOL/HDL Ratio: 2.7 (calc) (ref <5.0)
Triglycerides: 85 mg/dL (ref <150)

## 2024-02-12 LAB — CBC WITH DIFFERENTIAL/PLATELET
Absolute Lymphocytes: 1081 {cells}/uL (ref 850–3900)
Absolute Monocytes: 439 {cells}/uL (ref 200–950)
Basophils Absolute: 61 {cells}/uL (ref 0–200)
Basophils Relative: 1.2 %
Eosinophils Absolute: 102 {cells}/uL (ref 15–500)
Eosinophils Relative: 2 %
HCT: 39.5 % (ref 35.0–45.0)
Hemoglobin: 13.2 g/dL (ref 11.7–15.5)
MCH: 30.3 pg (ref 27.0–33.0)
MCHC: 33.4 g/dL (ref 32.0–36.0)
MCV: 90.8 fL (ref 80.0–100.0)
MPV: 10.3 fL (ref 7.5–12.5)
Monocytes Relative: 8.6 %
Neutro Abs: 3417 {cells}/uL (ref 1500–7800)
Neutrophils Relative %: 67 %
Platelets: 194 Thousand/uL (ref 140–400)
RBC: 4.35 Million/uL (ref 3.80–5.10)
RDW: 12 % (ref 11.0–15.0)
Total Lymphocyte: 21.2 %
WBC: 5.1 Thousand/uL (ref 3.8–10.8)

## 2024-02-12 LAB — COMPREHENSIVE METABOLIC PANEL WITH GFR
AG Ratio: 1.7 (calc) (ref 1.0–2.5)
ALT: 14 U/L (ref 6–29)
AST: 20 U/L (ref 10–35)
Albumin: 4.1 g/dL (ref 3.6–5.1)
Alkaline phosphatase (APISO): 74 U/L (ref 37–153)
BUN/Creatinine Ratio: 13 (calc) (ref 6–22)
BUN: 7 mg/dL (ref 7–25)
CO2: 27 mmol/L (ref 20–32)
Calcium: 8.8 mg/dL (ref 8.6–10.4)
Chloride: 104 mmol/L (ref 98–110)
Creat: 0.54 mg/dL — ABNORMAL LOW (ref 0.60–1.00)
Globulin: 2.4 g/dL (ref 1.9–3.7)
Glucose, Bld: 80 mg/dL (ref 65–99)
Potassium: 3.9 mmol/L (ref 3.5–5.3)
Sodium: 139 mmol/L (ref 135–146)
Total Bilirubin: 0.9 mg/dL (ref 0.2–1.2)
Total Protein: 6.5 g/dL (ref 6.1–8.1)
eGFR: 95 mL/min/1.73m2 (ref >=60)

## 2024-02-12 LAB — VITAMIN D 25 HYDROXY (VIT D DEFICIENCY, FRACTURES): Vit D, 25-Hydroxy: 33 ng/mL (ref 30–100)

## 2024-03-07 ENCOUNTER — Other Ambulatory Visit: Payer: Self-pay | Admitting: Family Medicine

## 2024-03-07 DIAGNOSIS — H1032 Unspecified acute conjunctivitis, left eye: Secondary | ICD-10-CM

## 2024-03-08 NOTE — Telephone Encounter (Signed)
 Discontinued on 10/13/23.  Requested Prescriptions  Pending Prescriptions Disp Refills   cromolyn  (OPTICROM ) 4 % ophthalmic solution [Pharmacy Med Name: Cromolyn  Sodium 4 % Ophthalmic Solution] 10 mL 0    Sig: INSTILL 1 DROP INTO EACH EYE 4 TIMES DAILY AS NEEDED     Ophthalmology:  Antiallergy Passed - 03/08/2024  3:40 PM      Passed - Valid encounter within last 12 months    Recent Outpatient Visits           1 month ago Primary hypertension   [facility] Bernardo Fend, DO   3 months ago Primary hypertension   [facility] Leavy Mole, PA-C   4 months ago Cough with hemoptysis   [facility] Bernardo Fend, [place]

## 2024-03-09 LAB — COLOGUARD: COLOGUARD: NEGATIVE

## 2024-04-28 ENCOUNTER — Other Ambulatory Visit: Payer: Self-pay | Admitting: Cardiology

## 2024-04-28 DIAGNOSIS — I4819 Other persistent atrial fibrillation: Secondary | ICD-10-CM

## 2024-04-28 NOTE — Telephone Encounter (Signed)
 Prescription refill request for Xarelto  received.  Indication:afib Last office visit:4/25 Weight:58.7  kg Age:78 Scr:0.54  9/25 CrCl:82.13  ml/min  Prescription refilled

## 2024-06-01 ENCOUNTER — Ambulatory Visit
Admission: RE | Admit: 2024-06-01 | Discharge: 2024-06-01 | Disposition: A | Discharge status: Home / Self Care | Attending: Internal Medicine | Admitting: Internal Medicine

## 2024-06-01 ENCOUNTER — Other Ambulatory Visit: Payer: Self-pay

## 2024-06-01 ENCOUNTER — Ambulatory Visit
Admission: RE | Admit: 2024-06-01 | Discharge: 2024-06-01 | Disposition: A | Discharge status: Home / Self Care | Source: Ambulatory Visit | Attending: Internal Medicine | Admitting: Internal Medicine

## 2024-06-01 ENCOUNTER — Ambulatory Visit: Admitting: Internal Medicine

## 2024-06-01 VITALS — BP 120/70 | HR 73 | Temp 98.1°F | Resp 16 | Ht 59.0 in | Wt 129.5 lb

## 2024-06-01 DIAGNOSIS — M545 Low back pain, unspecified: Secondary | ICD-10-CM | POA: Insufficient documentation

## 2024-06-01 DIAGNOSIS — M1711 Unilateral primary osteoarthritis, right knee: Secondary | ICD-10-CM | POA: Diagnosis not present

## 2024-06-01 NOTE — Progress Notes (Signed)
 "  Acute Office Visit  Subjective:     Patient ID: Zoe Alvarado, female    DOB: Jan 09, 1946, 79 y.o.   MRN: [id number]  Chief Complaint  Patient presents with   Hip Pain    Right side, sciatica     Hip Pain  Pertinent negatives include no tingling.   Patient is in today for left back pain.   Discussed the use of AI scribe software for clinical note transcription with the patient, who gave verbal consent to proceed.  History of Present Illness Zoe Alvarado is a 79 year old female with osteoporosis who presents with left lower back pain.  She developed sudden sharp, shooting left lower back pain after the New Year, sometimes radiating to the thigh. Pain is worsened by bending and walking and makes it hard to put on socks. It persists at night and disrupts sleep. She reports no tingling or numbness but has significant difficulty bending and walking due to pain.  She received her first Juvonte injection for osteoporosis on December 2 after five years of alendronate. Since onset of pain she has limited physical activity. She has an arthritic knee that makes stairs difficult and she often compensates by using her left leg more.  She uses Tylenol  once daily with minimal relief and tried a topical agent with little benefit. She avoids NSAIDs because she takes Xarelto .   Review of Systems  Musculoskeletal:  Positive for back pain. Negative for falls.  Neurological:  Negative for tingling and weakness.        Objective:    BP 120/70 (Cuff Size: Normal)   Pulse 73   Temp 98.1 F (36.7 C) (Oral)   Resp 16   Ht 4' 11 (1.499 m)   Wt 129 lb 8 oz (58.7 kg)   SpO2 99%   BMI 26.16 kg/m  BP Readings from Last 3 Encounters:  06/01/24 120/70  01/25/24 132/76  10/13/23 110/72   Wt Readings from Last 3 Encounters:  06/01/24 129 lb 8 oz (58.7 kg)  01/25/24 129 lb 6.4 oz (58.7 kg)  11/24/23 125 lb (56.7 kg)      Physical Exam Constitutional:      Appearance: Normal appearance.   HENT:     Head: Normocephalic and atraumatic.  Eyes:     Conjunctiva/sclera: Conjunctivae normal.  Cardiovascular:     Rate and Rhythm: Normal rate and regular rhythm.  Pulmonary:     Effort: Pulmonary effort is normal.     Breath sounds: Normal breath sounds.  Musculoskeletal:     Lumbar back: Tenderness and bony tenderness present. No swelling or spasms. Decreased range of motion.  Skin:    General: Skin is warm and dry.  Neurological:     General: No focal deficit present.     Mental Status: She is alert. Mental status is at baseline.  Psychiatric:        Mood and Affect: Mood normal.        Behavior: Behavior normal.     No results found for any visits on 06/01/24.      Assessment & Plan:   Assessment & Plan Lumbar pain with possible sciatica and osteoarthritis Lumbar pain radiating to left thigh, likely lumbar origin. Possible sciatica due to piriformis involvement. Pain may be linked to compensatory biomechanics from knee osteoarthritis. Xarelto  limits NSAID use. - Ordered x-ray of lower back to assess for arthritis, bulging disc, or other structural issues. - Recommended Voltaren gel for topical anti-inflammatory treatment. -  Advised rest and avoidance of activities that exacerbate pain. - Suggested gradual return to exercise and stretching if x-ray is normal. - Will consider physical therapy if pain persists or worsens in two weeks.  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 Pain management complicated by Xarelto  use. - Recommended Voltaren gel for knee pain management. - Advised on weight-bearing activities and compensatory movements to reduce strain on knee and back.  - DG Lumbar Spine Complete; Future   Return in about 8 months (around 01/30/2025) for wants to schedule medicare wellness as well .  Sharyle Fischer, DO   "

## 2024-06-07 ENCOUNTER — Ambulatory Visit: Payer: Self-pay | Admitting: Internal Medicine

## 2024-06-09 ENCOUNTER — Encounter: Admitting: Internal Medicine

## 2024-06-09 ENCOUNTER — Other Ambulatory Visit: Payer: Self-pay

## 2024-06-09 NOTE — Progress Notes (Signed)
"  Erroneous patient not seen  "

## 2024-06-09 NOTE — Progress Notes (Deleted)
 Virtual Visit via Video Note  I connected with Calaya J Giebel on 06/09/24 at  1:20 PM EST by a video enabled telemedicine application and verified that I am speaking with the correct person using two identifiers.  Location: Patient: *** Provider: ***   I discussed the limitations of evaluation and management by telemedicine and the availability of in person appointments. The patient expressed understanding and agreed to proceed.  History of Present Illness:    Observations/Objective:   Assessment and Plan:   Follow Up Instructions:    I discussed the assessment and treatment plan with the patient. The patient was provided an opportunity to ask questions and all were answered. The patient agreed with the plan and demonstrated an understanding of the instructions.   The patient was advised to call back or seek an in-person evaluation if the symptoms worsen or if the condition fails to improve as anticipated.  I provided *** minutes of non-face-to-face time during this encounter.   Sharyle Fischer, DO This encounter was created in error - please disregard.

## 2024-06-16 ENCOUNTER — Ambulatory Visit
Admission: RE | Admit: 2024-06-16 | Discharge: 2024-06-16 | Disposition: A | Discharge status: Home / Self Care | Attending: Internal Medicine | Admitting: Internal Medicine

## 2024-06-16 ENCOUNTER — Other Ambulatory Visit: Payer: Self-pay

## 2024-06-16 ENCOUNTER — Ambulatory Visit
Admission: RE | Admit: 2024-06-16 | Discharge: 2024-06-16 | Disposition: A | Discharge status: Home / Self Care | Source: Ambulatory Visit | Attending: Internal Medicine | Admitting: Internal Medicine

## 2024-06-16 ENCOUNTER — Encounter: Payer: Self-pay | Admitting: Internal Medicine

## 2024-06-16 ENCOUNTER — Ambulatory Visit: Admitting: Internal Medicine

## 2024-06-16 ENCOUNTER — Telehealth: Payer: Self-pay

## 2024-06-16 VITALS — BP 124/78 | HR 90 | Temp 98.3°F | Resp 16 | Ht 59.0 in | Wt 129.1 lb

## 2024-06-16 DIAGNOSIS — M25552 Pain in left hip: Secondary | ICD-10-CM

## 2024-06-16 DIAGNOSIS — M47817 Spondylosis without myelopathy or radiculopathy, lumbosacral region: Secondary | ICD-10-CM | POA: Diagnosis not present

## 2024-06-16 DIAGNOSIS — M1711 Unilateral primary osteoarthritis, right knee: Secondary | ICD-10-CM

## 2024-06-16 DIAGNOSIS — M810 Age-related osteoporosis without current pathological fracture: Secondary | ICD-10-CM

## 2024-06-16 MED ORDER — TRAMADOL HCL 50 MG PO TABS: 25.0000 mg | ORAL_TABLET | Freq: Two times a day (BID) | ORAL | 0 refills | Status: AC | PRN

## 2024-06-16 NOTE — Telephone Encounter (Signed)
 Copied from CRM [id number]. Topic: Medical Record Request - Other >> Jun 16, 2024  4:38 PM Gattis SQUIBB wrote: Reason for CRM: Patient called sating her visit summary and she said there was several things on her summary that were incorrect and she would like Dr. Bernardo to call her back.  She wants to have these things corrected.  [phone]

## 2024-06-16 NOTE — Telephone Encounter (Signed)
 Several things mis interpreted in her chart from today's visit.  Pt states the AI picked up info about her husband and not her.  Will update Dr. Bernardo and have her fix chart and notify pt once completed

## 2024-06-16 NOTE — Progress Notes (Addendum)
 "  Acute Office Visit  Subjective:     Patient ID: Zoe Alvarado, female    DOB: 10-06-1945, 79 y.o.   MRN: [id number]  Chief Complaint  Patient presents with   Hip Pain    Left hip    Hip Pain  Pertinent negatives include no tingling.   Patient is in today for back and left hip pain.   Discussed the use of AI scribe software for clinical note transcription with the patient, who gave verbal consent to proceed.  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 She is accompanied by her husband.  She describes sharp localized pain in the sacral area radiating to the hip and thigh, present since early January and persistent with fluctuation. On worse days she has burning hip pain, worsened by prolonged standing. She denies pain radiating below the thigh.  She has degenerative changes in the lower thoracic and lumbar spine at L3-L4, L4-L5, and L5-S1 with grade 1 anterolisthesis.  She uses Tylenol  arthritis 8-hour once daily as needed with limited relief. She has osteoporosis diagnosed after a pathologic fracture in the 1990s and recently switched to Jubbonti, similar to Prolia. She notes her current pain began around the time she started Jubbonti.  She is retired. Pain limits bending and daily activities, and she sometimes needs assistance from her husband.   Review of Systems  Musculoskeletal:  Positive for back pain and joint pain. Negative for falls.  Neurological:  Negative for tingling and weakness.        Objective:    BP 124/78 (Cuff Size: Large)   Pulse 90   Temp 98.3 F (36.8 C) (Oral)   Resp 16   Ht 4' 11 (1.499 m)   Wt 129 lb 1.6 oz (58.6 kg)   SpO2 97%   BMI 26.08 kg/m  BP Readings from Last 3 Encounters:  06/16/24 124/78  06/01/24 120/70  01/25/24 132/76   Wt Readings from Last 3 Encounters:  06/16/24 129 lb 1.6 oz (58.6 kg)  06/01/24 129 lb 8 oz (58.7 kg)   01/25/24 129 lb 6.4 oz (58.7 kg)      Physical Exam Constitutional:      Appearance: Normal appearance.  HENT:     Head: Normocephalic and atraumatic.  Eyes:     Conjunctiva/sclera: Conjunctivae normal.  Cardiovascular:     Rate and Rhythm: Normal rate and regular rhythm.  Pulmonary:     Effort: Pulmonary effort is normal.     Breath sounds: Normal breath sounds.  Skin:    General: Skin is warm and dry.  Neurological:     General: No focal deficit present.     Mental Status: She is alert. Mental status is at baseline.  Psychiatric:        Mood and Affect: Mood normal.        Behavior: Behavior normal.     No results found for any visits on 06/16/24.      Assessment & Plan:   Assessment & Plan  Left hip pain likely due to osteoarthritis Chronic left hip pain likely due to osteoarthritis, with radiation to the thigh. Differential includes soft tissue issues or impingement. Recent onset of pain following osteoporosis medication change, but causation is not definitive. - Ordered x-ray of left hip and pelvis. - Referred to physical therapy for hip pain management. - Prescribed tramadol  25 mg twice daily as  needed for severe pain. - Discussed potential MRI if physical therapy does not improve symptoms.  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 Grade one anterolisthesis noted. Pain management limited by Xarelto  use. - Continue Tylenol  for pain management. - Prescribed tramadol  for severe pain days. - Encouraged physical therapy to improve mobility and manage pain.  Osteoporosis Diagnosed following a pathologic fracture. Currently on Juvondi, with recent onset of hip pain potentially related to medication. Discussed risks of stopping medication. - Continue Jubbonti for osteoporosis management. - Monitor for changes in pain with  continued medication use.  Right knee osteoarthritis Chronic right knee osteoarthritis with limited mobility and pain. Tylenol  provides minimal relief. - Continue Tylenol  as needed for pain. - Referred to physical therapy to improve knee mobility and manage pain.  - DG HIP UNILAT W OR W/O PELVIS 2-3 VIEWS LEFT; Future - Ambulatory referral to Physical Therapy - traMADol  (ULTRAM ) 50 MG tablet; Take 0.5 tablets (25 mg total) by mouth every 12 (twelve) hours as needed for severe pain (pain score 7-10).  Dispense: 30 tablet; Refill: 0  Return in about 6 weeks (around 07/28/2024).  Sharyle Fischer, DO   "

## 2024-06-20 ENCOUNTER — Ambulatory Visit: Payer: Self-pay | Admitting: Internal Medicine

## 2024-06-27 ENCOUNTER — Ambulatory Visit

## 2024-06-29 ENCOUNTER — Ambulatory Visit: Admitting: Physical Therapy

## 2024-07-04 ENCOUNTER — Ambulatory Visit

## 2024-07-06 ENCOUNTER — Ambulatory Visit

## 2024-07-11 ENCOUNTER — Ambulatory Visit

## 2024-07-13 ENCOUNTER — Ambulatory Visit

## 2024-07-18 ENCOUNTER — Ambulatory Visit

## 2024-07-20 ENCOUNTER — Ambulatory Visit

## 2024-07-25 ENCOUNTER — Ambulatory Visit

## 2024-07-27 ENCOUNTER — Ambulatory Visit

## 2024-07-28 ENCOUNTER — Ambulatory Visit: Admitting: Internal Medicine

## 2024-08-01 ENCOUNTER — Ambulatory Visit

## 2024-08-03 ENCOUNTER — Ambulatory Visit

## 2024-08-08 ENCOUNTER — Ambulatory Visit

## 2024-08-10 ENCOUNTER — Ambulatory Visit

## 2024-08-15 ENCOUNTER — Ambulatory Visit

## 2024-08-17 ENCOUNTER — Ambulatory Visit

## 2024-08-22 ENCOUNTER — Ambulatory Visit

## 2024-08-24 ENCOUNTER — Ambulatory Visit

## 2024-08-29 ENCOUNTER — Ambulatory Visit

## 2024-08-31 ENCOUNTER — Ambulatory Visit

## 2024-09-05 ENCOUNTER — Ambulatory Visit

## 2024-09-07 ENCOUNTER — Ambulatory Visit

## 2024-09-12 ENCOUNTER — Ambulatory Visit

## 2024-09-14 ENCOUNTER — Ambulatory Visit

## 2024-09-19 ENCOUNTER — Ambulatory Visit

## 2024-09-21 ENCOUNTER — Ambulatory Visit

## 2024-09-26 ENCOUNTER — Ambulatory Visit

## 2024-09-28 ENCOUNTER — Ambulatory Visit

## 2024-10-03 ENCOUNTER — Ambulatory Visit

## 2024-10-05 ENCOUNTER — Ambulatory Visit

## 2024-10-12 ENCOUNTER — Ambulatory Visit

## 2024-10-17 ENCOUNTER — Ambulatory Visit

## 2024-10-19 ENCOUNTER — Ambulatory Visit

## 2024-10-24 ENCOUNTER — Ambulatory Visit

## 2024-10-26 ENCOUNTER — Ambulatory Visit

## 2024-10-31 ENCOUNTER — Ambulatory Visit

## 2024-11-02 ENCOUNTER — Ambulatory Visit

## 2024-11-07 ENCOUNTER — Ambulatory Visit

## 2024-11-09 ENCOUNTER — Ambulatory Visit

## 2025-01-30 ENCOUNTER — Ambulatory Visit: Admitting: Internal Medicine
